# Patient Record
Sex: Female | Born: 1985 | Race: Black or African American | Hispanic: No | Marital: Single | Smoking: Never smoker
Health system: Southern US, Community
[De-identification: ages and names within clinical notes are randomized; demographics above are authoritative.]

## PROBLEM LIST (undated history)

## (undated) DIAGNOSIS — R51 Headache: Secondary | ICD-10-CM

## (undated) DIAGNOSIS — K219 Gastro-esophageal reflux disease without esophagitis: Secondary | ICD-10-CM

## (undated) DIAGNOSIS — J189 Pneumonia, unspecified organism: Secondary | ICD-10-CM

## (undated) DIAGNOSIS — B3731 Acute candidiasis of vulva and vagina: Secondary | ICD-10-CM

## (undated) DIAGNOSIS — A5901 Trichomonal vulvovaginitis: Secondary | ICD-10-CM

## (undated) DIAGNOSIS — G4734 Idiopathic sleep related nonobstructive alveolar hypoventilation: Secondary | ICD-10-CM

## (undated) DIAGNOSIS — B373 Candidiasis of vulva and vagina: Secondary | ICD-10-CM

## (undated) DIAGNOSIS — R030 Elevated blood-pressure reading, without diagnosis of hypertension: Secondary | ICD-10-CM

## (undated) DIAGNOSIS — D649 Anemia, unspecified: Secondary | ICD-10-CM

## (undated) DIAGNOSIS — R519 Headache, unspecified: Secondary | ICD-10-CM

## (undated) DIAGNOSIS — K5909 Other constipation: Secondary | ICD-10-CM

## (undated) DIAGNOSIS — Q011 Nasofrontal encephalocele: Secondary | ICD-10-CM

## (undated) DIAGNOSIS — R002 Palpitations: Secondary | ICD-10-CM

## (undated) DIAGNOSIS — F419 Anxiety disorder, unspecified: Secondary | ICD-10-CM

## (undated) DIAGNOSIS — E669 Obesity, unspecified: Secondary | ICD-10-CM

## (undated) HISTORY — DX: Elevated blood-pressure reading, without diagnosis of hypertension: R03.0

## (undated) HISTORY — DX: Idiopathic sleep related nonobstructive alveolar hypoventilation: G47.34

## (undated) HISTORY — DX: Palpitations: R00.2

---
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
## 2008-02-12 ENCOUNTER — Emergency Department (HOSPITAL_COMMUNITY): Admission: EM | Admit: 2008-02-12 | Discharge: 2008-02-12 | Payer: Self-pay | Admitting: Emergency Medicine

## 2008-10-01 ENCOUNTER — Emergency Department (HOSPITAL_COMMUNITY): Admission: EM | Admit: 2008-10-01 | Discharge: 2008-10-01 | Payer: Self-pay | Admitting: Emergency Medicine

## 2009-03-27 ENCOUNTER — Emergency Department (HOSPITAL_COMMUNITY): Admission: EM | Admit: 2009-03-27 | Discharge: 2009-03-27 | Payer: Self-pay | Admitting: Family Medicine

## 2011-01-08 ENCOUNTER — Emergency Department (HOSPITAL_COMMUNITY)
Admission: EM | Admit: 2011-01-08 | Discharge: 2011-01-09 | Payer: Self-pay | Source: Home / Self Care | Admitting: Emergency Medicine

## 2011-01-11 LAB — POCT I-STAT, CHEM 8
BUN: 9 mg/dL (ref 6–23)
Calcium, Ion: 1.23 mmol/L (ref 1.12–1.32)
Creatinine, Ser: 0.9 mg/dL (ref 0.4–1.2)
Potassium: 4.3 mEq/L (ref 3.5–5.1)

## 2011-01-12 LAB — CBC: MCHC: 27.5 g/dL — ABNORMAL LOW (ref 30.0–36.0)

## 2011-01-12 LAB — DIFFERENTIAL
Lymphs Abs: 2.9 10*3/uL (ref 0.7–4.0)
Monocytes Relative: 6 % (ref 3–12)
Neutrophils Relative %: 71 % (ref 43–77)

## 2011-01-26 ENCOUNTER — Inpatient Hospital Stay (INDEPENDENT_AMBULATORY_CARE_PROVIDER_SITE_OTHER)
Admission: RE | Admit: 2011-01-26 | Discharge: 2011-01-26 | Disposition: A | Discharge status: Home / Self Care | Payer: Self-pay | Source: Ambulatory Visit | Attending: Family Medicine | Admitting: Family Medicine

## 2011-01-26 DIAGNOSIS — R350 Frequency of micturition: Secondary | ICD-10-CM

## 2011-01-26 LAB — POCT URINALYSIS DIPSTICK
Ketones, ur: NEGATIVE mg/dL
Protein, ur: 100 mg/dL — AB
Urine Glucose, Fasting: NEGATIVE mg/dL
Urobilinogen, UA: 0.2 mg/dL (ref 0.0–1.0)
pH: 5.5 (ref 5.0–8.0)

## 2011-01-28 LAB — URINE CULTURE: Culture  Setup Time: 201202072257

## 2011-01-29 ENCOUNTER — Inpatient Hospital Stay (INDEPENDENT_AMBULATORY_CARE_PROVIDER_SITE_OTHER)
Admission: RE | Admit: 2011-01-29 | Discharge: 2011-01-29 | Disposition: A | Discharge status: Home / Self Care | Payer: Self-pay | Source: Ambulatory Visit | Attending: Family Medicine | Admitting: Family Medicine

## 2011-01-29 DIAGNOSIS — K625 Hemorrhage of anus and rectum: Secondary | ICD-10-CM

## 2011-01-29 DIAGNOSIS — K5900 Constipation, unspecified: Secondary | ICD-10-CM

## 2011-04-02 ENCOUNTER — Inpatient Hospital Stay (INDEPENDENT_AMBULATORY_CARE_PROVIDER_SITE_OTHER)
Admission: RE | Admit: 2011-04-02 | Discharge: 2011-04-02 | Disposition: A | Discharge status: Home / Self Care | Payer: Self-pay | Source: Ambulatory Visit | Attending: Emergency Medicine | Admitting: Emergency Medicine

## 2011-04-02 DIAGNOSIS — N898 Other specified noninflammatory disorders of vagina: Secondary | ICD-10-CM

## 2011-04-02 DIAGNOSIS — D649 Anemia, unspecified: Secondary | ICD-10-CM

## 2011-04-02 LAB — POCT I-STAT, CHEM 8
Calcium, Ion: 1.2 mmol/L (ref 1.12–1.32)
HCT: 34 % — ABNORMAL LOW (ref 36.0–46.0)
Hemoglobin: 11.6 g/dL — ABNORMAL LOW (ref 12.0–15.0)
Potassium: 5.3 mEq/L — ABNORMAL HIGH (ref 3.5–5.1)
Sodium: 140 mEq/L (ref 135–145)
TCO2: 28 mmol/L (ref 0–100)

## 2011-04-29 ENCOUNTER — Inpatient Hospital Stay (INDEPENDENT_AMBULATORY_CARE_PROVIDER_SITE_OTHER)
Admission: RE | Admit: 2011-04-29 | Discharge: 2011-04-29 | Disposition: A | Discharge status: Home / Self Care | Payer: Self-pay | Source: Ambulatory Visit

## 2011-04-29 DIAGNOSIS — R109 Unspecified abdominal pain: Secondary | ICD-10-CM

## 2011-04-29 LAB — POCT URINALYSIS DIP (DEVICE)
Bilirubin Urine: NEGATIVE
Ketones, ur: NEGATIVE mg/dL
Protein, ur: NEGATIVE mg/dL
Specific Gravity, Urine: 1.015 (ref 1.005–1.030)

## 2011-04-29 LAB — POCT PREGNANCY, URINE: Preg Test, Ur: NEGATIVE

## 2012-03-29 ENCOUNTER — Emergency Department (HOSPITAL_COMMUNITY)
Admission: EM | Admit: 2012-03-29 | Discharge: 2012-03-29 | Disposition: A | Discharge status: Home / Self Care | Payer: Self-pay | Attending: Emergency Medicine | Admitting: Emergency Medicine

## 2012-03-29 ENCOUNTER — Encounter (HOSPITAL_COMMUNITY): Payer: Self-pay | Admitting: *Deleted

## 2012-03-29 ENCOUNTER — Emergency Department (HOSPITAL_COMMUNITY): Payer: Self-pay

## 2012-03-29 DIAGNOSIS — R05 Cough: Secondary | ICD-10-CM | POA: Insufficient documentation

## 2012-03-29 DIAGNOSIS — R509 Fever, unspecified: Secondary | ICD-10-CM | POA: Insufficient documentation

## 2012-03-29 DIAGNOSIS — IMO0001 Reserved for inherently not codable concepts without codable children: Secondary | ICD-10-CM | POA: Insufficient documentation

## 2012-03-29 DIAGNOSIS — R059 Cough, unspecified: Secondary | ICD-10-CM | POA: Insufficient documentation

## 2012-03-29 MED ORDER — IBUPROFEN 800 MG PO TABS
800.0000 mg | ORAL_TABLET | Freq: Once | ORAL | Status: AC
  Administered 2012-03-29: 800 mg via ORAL
  Filled 2012-03-29: qty 1

## 2012-03-29 MED ORDER — AZITHROMYCIN 250 MG PO TABS: ORAL_TABLET | ORAL | Status: AC

## 2012-03-29 NOTE — Discharge Instructions (Signed)
Your chest x-ray today has not shown any signs for concerning pneumonia infection. Your providers today have getting a prescription for an antibiotic to help prevent any type of infection causing your symptoms. Please take this as instructed for the following time. Continue to get plenty of rest and drink plenty of fluids to stay hydrated. You may use over-the-counter topical medicines to help with your symptoms.   Cough, Adult  A cough is a reflex that helps clear your throat and airways. It can help heal the body or may be a reaction to an irritated airway. A cough may only last 2 or 3 weeks (acute) or may last more than 8 weeks (chronic).  CAUSES Acute cough:  Viral or bacterial infections.  Chronic cough:  Infections.   Allergies.   Asthma.   Post-nasal drip.   Smoking.   Heartburn or acid reflux.   Some medicines.   Chronic lung problems (COPD).   Cancer.  SYMPTOMS   Cough.   Fever.   Chest pain.   Increased breathing rate.   High-pitched whistling sound when breathing (wheezing).   Colored mucus that you cough up (sputum).  TREATMENT   A bacterial cough may be treated with antibiotic medicine.   A viral cough must run its course and will not respond to antibiotics.   Your caregiver may recommend other treatments if you have a chronic cough.  HOME CARE INSTRUCTIONS   Only take over-the-counter or prescription medicines for pain, discomfort, or fever as directed by your caregiver. Use cough suppressants only as directed by your caregiver.   Use a cold steam vaporizer or humidifier in your bedroom or home to help loosen secretions.   Sleep in a semi-upright position if your cough is worse at night.   Rest as needed.   Stop smoking if you smoke.  SEEK IMMEDIATE MEDICAL CARE IF:   You have pus in your sputum.   Your cough starts to worsen.   You cannot control your cough with suppressants and are losing sleep.   You begin coughing up blood.   You  have difficulty breathing.   You develop pain which is getting worse or is uncontrolled with medicine.   You have a fever.  MAKE SURE YOU:   Understand these instructions.   Will watch your condition.   Will get help right away if you are not doing well or get worse.  Document Released: 06/04/2011 Document Revised: 11/25/2011 Document Reviewed: 06/04/2011 [facility] Patient Information 2012 [place], [place].   RESOURCE GUIDE  Dental Problems  Patients with Medicaid: [facility] [phone] [address]                                           [phone] [address] Phone:  [phone]                                                  Phone:  [phone]  If unable to pay or uninsured, contact:  [facility] or [facility]. to become qualified for the adult dental clinic.  Chronic Pain Problems Contact [facility]  [phone] Patients need to be referred by their primary care doctor.  Insufficient Money for Medicine Contact United Way:  call "211" or [facility] 2078425837.  No Primary Care Doctor Call [facility]  [phone] Other agencies that provide inexpensive medical care    [facility]  [phone]    [facility]  [phone]    [facility]  [phone]    [facility]  [phone]    [facility]  [phone]    [facility]  [phone]  Psychological Services [facility] Behavioral Health  [phone] [facility]  [phone] [facility]   [phone] (emergency services [phone])  Substance Abuse Resources [facility]  [phone] [facility] [phone] [facility] [phone] Floydene Flock [phone] Residential & Outpatient Substance Abuse Program  ([phone]  Abuse/Neglect [facility] Child Abuse Hotline (539)486-1145 [facility] Child Abuse Hotline 816-754-5840  (After Hours)  Emergency Shelter [facility] [phone]  [facility] [place] [facility] [phone] [facility] [phone]  MRSA Hotline #:   240-662-1799    [facility] Resources  [facility]     [employer]                          [facility] Dept. [address] [place]                       [address]      [address]  Blondell Reveal Phone:  [phone]                                   Phone:  [phone]                 Phone:  [phone]  [facility] Phone:  [phone]  [facility] Child Abuse Hotline 4091650641 [phone] (After Hours)

## 2012-03-29 NOTE — ED Notes (Signed)
Patient with cold symptoms last few days.  Patient states she has had sore throat, increasing today.

## 2012-03-29 NOTE — ED Notes (Signed)
Pt denies any pain or questions upon discharge. 

## 2012-03-29 NOTE — ED Provider Notes (Signed)
History     CSN: [id number]  Arrival date & time 03/29/12  2038   First MD Initiated Contact with Patient 03/29/12 2142      Chief Complaint  Patient presents with  . Generalized Body Aches     HPI  History provided by the patient. Patient is a 26 year old female with no significant past medical history presents with complaints of nasal congestion, cough and body aches began last night and all day today. 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 Symptoms came on acutely. Patient has used some over-the-counter NyQuil without significant relief. Patient denies any other aggravating or alleviating factors. Symptoms have been associated with some chills and sweats as well as body aches. Patient denies any nausea vomiting diarrhea. Patient denies any fever to me. Patient has no other significant past medical history.    History reviewed. No pertinent past medical history.  History reviewed. No pertinent past surgical history.  History reviewed. No pertinent family history.  History  Substance Use Topics  . Smoking status: Never Smoker   . Smokeless tobacco: Not on file  . Alcohol Use: Yes     socially    OB History    Grav Para Term Preterm Abortions TAB SAB Ect Mult Living                  Review of Systems  Constitutional: Positive for chills, diaphoresis and fatigue. Negative for fever and appetite change.  HENT: Positive for ear pain, congestion, sore throat and rhinorrhea. Negative for sinus pressure.   Respiratory: Positive for cough. Negative for shortness of breath.   Cardiovascular: Negative for chest pain.  Gastrointestinal: Negative for vomiting, abdominal pain and diarrhea.  Genitourinary: Negative for dysuria, frequency, hematuria and flank pain.  Musculoskeletal: Positive for myalgias.  Skin: Negative for rash.    Allergies  Review of patient's allergies indicates no known allergies.  Home Medications   Current Outpatient  Rx  Name Route Sig Dispense Refill  . NYQUIL PO Oral Take 30 mLs by mouth daily as needed. For cough and congestion      BP 129/61  Pulse 117  Temp(Src) 99.1 F (37.3 C) (Oral)  Resp 18  SpO2 99%  LMP 03/22/2012  Physical Exam  Nursing note and vitals reviewed. Constitutional: She is oriented to person, place, and time. She appears well-developed and well-nourished. No distress.  HENT:  Head: Normocephalic.  Right Ear: Tympanic membrane normal.  Left Ear: Tympanic membrane normal.  Mouth/Throat: Oropharynx is clear and moist.       Mild congestion   Neck: Normal range of motion. Neck supple.  Cardiovascular: Normal rate and regular rhythm.   Pulmonary/Chest: Effort normal and breath sounds normal. No respiratory distress. She has no wheezes. She has no rales.  Abdominal: Soft.  Lymphadenopathy:    She has no cervical adenopathy.  Neurological: She is alert and oriented to person, place, and time.  Skin: Skin is warm and dry. No rash noted.  Psychiatric: She has a normal mood and affect. Her behavior is normal.    ED Course  Procedures      Dg Chest 2 View  03/29/2012  *RADIOLOGY REPORT*  Clinical Data: Dizziness and body aches.  CHEST - 2 VIEW  Comparison: Two-view chest 10/01/2008.  Findings: The heart size is normal.  The lungs are clear.  The visualized soft tissues and bony thorax are unremarkable.  IMPRESSION: Negative chest.  Original Report Authenticated By: Jamesetta Orleans. MATTERN, M.D.  1. Cough   2. Fever       MDM  8:30 PM patient seen and evaluated. Patient no acute distress. Patient appears comfortable resting well.        Angus Seller, [place] 03/30/12 [phone]

## 2012-03-29 NOTE — ED Notes (Signed)
Pt reports generalized body aches x 24 hours.  Denies pain.  Pt reports productive cough that is greenish.  Reports fever and chills at home also.

## 2012-03-31 NOTE — ED Provider Notes (Signed)
Medical screening examination/treatment/procedure(s) were performed by non-physician practitioner and as supervising physician I was immediately available for consultation/collaboration. Devoria Albe, MD, Armando Gang   Ward Givens, MD 03/31/12 [phone]

## 2012-05-25 ENCOUNTER — Emergency Department (INDEPENDENT_AMBULATORY_CARE_PROVIDER_SITE_OTHER)
Admission: EM | Admit: 2012-05-25 | Discharge: 2012-05-25 | Disposition: A | Discharge status: Home / Self Care | Payer: Self-pay | Source: Home / Self Care | Attending: Emergency Medicine | Admitting: Emergency Medicine

## 2012-05-25 ENCOUNTER — Encounter (HOSPITAL_COMMUNITY): Payer: Self-pay | Admitting: Emergency Medicine

## 2012-05-25 DIAGNOSIS — N760 Acute vaginitis: Secondary | ICD-10-CM

## 2012-05-25 HISTORY — DX: Candidiasis of vulva and vagina: B37.3

## 2012-05-25 HISTORY — DX: Trichomonal vulvovaginitis: A59.01

## 2012-05-25 HISTORY — DX: Acute candidiasis of vulva and vagina: B37.31

## 2012-05-25 LAB — WET PREP, GENITAL: Trich, Wet Prep: NONE SEEN

## 2012-05-25 LAB — POCT URINALYSIS DIP (DEVICE)
Bilirubin Urine: NEGATIVE
Nitrite: POSITIVE — AB
Protein, ur: NEGATIVE mg/dL
Urobilinogen, UA: 0.2 mg/dL (ref 0.0–1.0)

## 2012-05-25 LAB — POCT PREGNANCY, URINE: Preg Test, Ur: NEGATIVE

## 2012-05-25 MED ORDER — FLUCONAZOLE 150 MG PO TABS: ORAL_TABLET | ORAL | Status: AC

## 2012-05-25 NOTE — Discharge Instructions (Signed)
Take the medication as written. Give us a working phone number so that we can contact you if needed. Refrain from sexual contact until you know your results and your partner(s) are treated. Return if you get worse, have a fever >100.4, or for any concerns.  ° °Go to www.goodrx.com to look up your medications. This will give you a list of where you can find your prescriptions at the most affordable prices.  °

## 2012-05-25 NOTE — ED Provider Notes (Signed)
History     CSN: [id number]  Arrival date & time 05/25/12  1754   First MD Initiated Contact with Patient 05/25/12 1905      Chief Complaint  Patient presents with  . Vaginal Itching  . Rash    (Consider location/radiation/quality/duration/timing/severity/associated sxs/prior treatment) HPI Comments: Patient reports.vaginal itching, vulvar irritation and erythema starting a week ago. Reports some dysuria. No nausea, vomiting, urgency, frequency, cloudy or oderous urine, hematuria. No vaginal odor or discharge. No genital rash. No fevers, abdominal pain, back pain. She was treated for trichomonas last week with 2 g of Flagyl. She states she's not been sexually active since. She is sexually active with single female partner. He is asymptomatic. She states he was treated for Trichomonas as well. They use condoms intermittently. States this feels similar to previous episodes of yeast infections. Start using some Monistat external cream today with some relief. Has a history trichomonas, vaginal yeast infections. No history of diabetes, gonorrhea, BV, Chlamydia, herpes, HIV, syphilis.  ROS as noted in HPI. All other ROS negative.   Patient is a 26 y.o. female presenting with vaginal itching. The history is provided by the patient. No language interpreter was used.  Vaginal Itching This is a new problem. The current episode started more than 1 week ago. The problem occurs constantly. The problem has not changed since onset.Pertinent negatives include no abdominal pain. The symptoms are aggravated by nothing. The symptoms are relieved by nothing. Treatments tried: Monistat. The treatment provided mild relief.    Past Medical History  Diagnosis Date  . Trichomonal vaginitis   . Vaginal yeast infection     History reviewed. No pertinent past surgical history.  History reviewed. No pertinent family history.  History  Substance Use Topics  . Smoking status: Never Smoker   . Smokeless tobacco:  Not on file  . Alcohol Use: Yes     socially    OB History    Grav Para Term Preterm Abortions TAB SAB Ect Mult Living                  Review of Systems  Gastrointestinal: Negative for abdominal pain.    Allergies  Review of patient's allergies indicates no known allergies.  Home Medications   Current Outpatient Rx  Name Route Sig Dispense Refill  . MICONAZOLE NITRATE 2 % EX CREA Topical Apply topically 2 (two) times daily.    Marland Kitchen FLUCONAZOLE 150 MG PO TABS  1 tab po x 1. May repeat in 72 hours if no improvement 2 tablet 0    BP 117/73  Pulse 110  Temp(Src) 99.2 F (37.3 C) (Oral)  Resp 18  SpO2 100%  LMP 05/03/2012  Physical Exam  Nursing note and vitals reviewed. Constitutional: She is oriented to person, place, and time. She appears well-developed and well-nourished. No distress.  HENT:  Head: Normocephalic and atraumatic.  Eyes: EOM are normal.  Neck: Normal range of motion.  Cardiovascular: Normal rate.   Pulmonary/Chest: Effort normal.  Abdominal: Soft. Bowel sounds are normal. She exhibits no distension. There is no tenderness. There is no rebound and no guarding.  Genitourinary: Uterus normal. Pelvic exam was performed with patient supine. There is no rash on the right labia. There is no rash on the left labia. Uterus is not tender. Cervix exhibits no motion tenderness and no friability. Right adnexum displays no mass, no tenderness and no fullness. Left adnexum displays no mass, no tenderness and no fullness. No erythema, tenderness or bleeding  around the vagina. No foreign body around the vagina. Vaginal discharge found.       Erythematous, irritated vulva. No blisters noted. Scant Thick white nonodorous vaginal d/c. Questionable lesion on cervix. Chaperone present during exam.  Musculoskeletal: Normal range of motion.  Lymphadenopathy:       Right: No inguinal adenopathy present.       Left: No inguinal adenopathy present.  Neurological: She is alert and  oriented to person, place, and time.  Skin: Skin is warm and dry.  Psychiatric: She has a normal mood and affect. Her behavior is normal. Judgment and thought content normal.    ED Course  Procedures (including critical care time)  Labs Reviewed  POCT URINALYSIS DIP (DEVICE) - Abnormal; Notable for the following:    Hgb urine dipstick TRACE (*)    Nitrite POSITIVE (*)    Leukocytes, UA SMALL (*) Biochemical Testing Only. Please order routine urinalysis from main lab if confirmatory testing is needed.   All other components within normal limits  WET PREP, GENITAL - Abnormal; Notable for the following:    Yeast Wet Prep HPF POC FEW (*)    WBC, Wet Prep HPF POC MODERATE (*)    All other components within normal limits  POCT PREGNANCY, URINE  GC/CHLAMYDIA PROBE AMP, GENITAL  HERPES SIMPLEX VIRUS CULTURE   No results found.   1. Vaginitis and vulvovaginitis     Results for orders placed during the hospital encounter of 05/25/12  POCT URINALYSIS DIP (DEVICE)      Component Value Range   Glucose, UA NEGATIVE  NEGATIVE (mg/dL)   Bilirubin Urine NEGATIVE  NEGATIVE    Ketones, ur NEGATIVE  NEGATIVE (mg/dL)   Specific Gravity, Urine 1.010  1.005 - 1.030    Hgb urine dipstick TRACE (*) NEGATIVE    pH 6.5  5.0 - 8.0    Protein, ur NEGATIVE  NEGATIVE (mg/dL)   Urobilinogen, UA 0.2  0.0 - 1.0 (mg/dL)   Nitrite POSITIVE (*) NEGATIVE    Leukocytes, UA SMALL (*) NEGATIVE   POCT PREGNANCY, URINE      Component Value Range   Preg Test, Ur NEGATIVE  NEGATIVE   WET PREP, GENITAL      Component Value Range   Yeast Wet Prep HPF POC FEW (*) NONE SEEN    Trich, Wet Prep NONE SEEN  NONE SEEN    Clue Cells Wet Prep HPF POC NONE SEEN  NONE SEEN    WBC, Wet Prep HPF POC MODERATE (*) NONE SEEN    Results for orders placed during the hospital encounter of 05/25/12  POCT URINALYSIS DIP (DEVICE)      Component Value Range   Glucose, UA NEGATIVE  NEGATIVE (mg/dL)   Bilirubin Urine NEGATIVE   NEGATIVE    Ketones, ur NEGATIVE  NEGATIVE (mg/dL)   Specific Gravity, Urine 1.010  1.005 - 1.030    Hgb urine dipstick TRACE (*) NEGATIVE    pH 6.5  5.0 - 8.0    Protein, ur NEGATIVE  NEGATIVE (mg/dL)   Urobilinogen, UA 0.2  0.0 - 1.0 (mg/dL)   Nitrite POSITIVE (*) NEGATIVE    Leukocytes, UA SMALL (*) NEGATIVE   POCT PREGNANCY, URINE      Component Value Range   Preg Test, Ur NEGATIVE  NEGATIVE   WET PREP, GENITAL      Component Value Range   Yeast Wet Prep HPF POC FEW (*) NONE SEEN    Trich, Wet Prep NONE SEEN  NONE  SEEN    Clue Cells Wet Prep HPF POC NONE SEEN  NONE SEEN    WBC, Wet Prep HPF POC MODERATE (*) NONE SEEN      MDM  H&P most c/w yeast infection, especially as she was treated for trichomonas last week. She states that she was called by the health department today and told her gonorrhea and Chlamydia were negative. Will not repeat the gonorrhea and Chlamydia probe. Will sent off wet prep, and HSV culture . Udip noted,  this is most likely from the vaginal infection.  Will not treat empirically now for gonorrhea Chlamydia. Will send home with  diflucan for yeast infection. Advised pt to refrain from sexual contact until she  knows lab results, symptoms resolve, and partner(s) are treated. Pt provided working phone number. Pt agrees.     Luiz Blare, MD 05/25/12 [phone]

## 2012-05-25 NOTE — ED Notes (Signed)
PT HERE WITH C/O VAG RASH TO OUTER RIGHT LABIA AND ITCH/BURN WITH URINATION THAT STARTED X 10 DYS AGO.STATES SHE TESTED NEG FOR STD'S [facility] LAST WEEK.NO VAG D/C OR PAIN

## 2012-05-26 NOTE — ED Notes (Signed)
Wet prep: few yeast, mod. WBC's. Pt. Adequately treated with Diflucan. Vassie Moselle 05/26/2012

## 2012-05-29 LAB — HERPES SIMPLEX VIRUS CULTURE: Culture: NOT DETECTED

## 2013-03-24 ENCOUNTER — Encounter (HOSPITAL_COMMUNITY): Payer: Self-pay | Admitting: Emergency Medicine

## 2013-03-24 ENCOUNTER — Other Ambulatory Visit (HOSPITAL_COMMUNITY)
Admission: RE | Admit: 2013-03-24 | Discharge: 2013-03-24 | Disposition: A | Discharge status: Home / Self Care | Payer: Self-pay | Source: Ambulatory Visit | Attending: Emergency Medicine | Admitting: Emergency Medicine

## 2013-03-24 ENCOUNTER — Emergency Department (INDEPENDENT_AMBULATORY_CARE_PROVIDER_SITE_OTHER)
Admission: EM | Admit: 2013-03-24 | Discharge: 2013-03-24 | Disposition: A | Discharge status: Home / Self Care | Payer: Self-pay | Source: Home / Self Care | Attending: Emergency Medicine | Admitting: Emergency Medicine

## 2013-03-24 DIAGNOSIS — Z113 Encounter for screening for infections with a predominantly sexual mode of transmission: Secondary | ICD-10-CM | POA: Insufficient documentation

## 2013-03-24 DIAGNOSIS — N760 Acute vaginitis: Secondary | ICD-10-CM | POA: Insufficient documentation

## 2013-03-24 DIAGNOSIS — B373 Candidiasis of vulva and vagina: Secondary | ICD-10-CM

## 2013-03-24 DIAGNOSIS — N390 Urinary tract infection, site not specified: Secondary | ICD-10-CM

## 2013-03-24 LAB — POCT URINALYSIS DIP (DEVICE)
Ketones, ur: NEGATIVE mg/dL
Nitrite: NEGATIVE
Protein, ur: NEGATIVE mg/dL
Urobilinogen, UA: 1 mg/dL (ref 0.0–1.0)
pH: 7.5 (ref 5.0–8.0)

## 2013-03-24 MED ORDER — METRONIDAZOLE 500 MG PO TABS: 500.0000 mg | ORAL_TABLET | Freq: Two times a day (BID) | ORAL | Status: DC

## 2013-03-24 MED ORDER — METRONIDAZOLE 500 MG PO TABS: ORAL_TABLET | ORAL | Status: DC

## 2013-03-24 MED ORDER — FLUCONAZOLE 200 MG PO TABS: 200.0000 mg | ORAL_TABLET | Freq: Every day | ORAL | Status: AC

## 2013-03-24 MED ORDER — SULFAMETHOXAZOLE-TRIMETHOPRIM 800-160 MG PO TABS: 1.0000 | ORAL_TABLET | Freq: Two times a day (BID) | ORAL | Status: DC

## 2013-03-24 NOTE — ED Provider Notes (Signed)
Medical screening examination/treatment/procedure(s) were performed by non-physician practitioner and as supervising physician I was immediately available for consultation/collaboration.  Leslee Home, M.D.  Reuben Likes, MD 03/24/13 [phone]

## 2013-03-24 NOTE — ED Provider Notes (Signed)
History     CSN: [id number]  Arrival date & time 03/24/13  1109   First MD Initiated Contact with Patient 03/24/13 1129      Chief Complaint  Patient presents with  . Vaginal Itching     Patient is a 27 y.o. female presenting with vaginal itching. The history is provided by the patient.  Vaginal Itching This is a new problem. The current episode started more than 1 week ago. The problem occurs constantly. The problem has been gradually worsening. Pertinent negatives include no abdominal pain. She has tried nothing for the symptoms.  Pt reports a 2 week h/o persistent vag itching. Denies abd pain or noticing any unusual vag d/c. States she is sexually active but always uses barrier protection. Admits to h/o BV and Trich but no other STD hx. States the vaginal irritation now involves the external vaginal area as well as internal. Has noticed some frequency of urination but denies dysuria.   Past Medical History  Diagnosis Date  . Trichomonal vaginitis   . Vaginal yeast infection     History reviewed. No pertinent past surgical history.  No family history on file.  History  Substance Use Topics  . Smoking status: Never Smoker   . Smokeless tobacco: Not on file  . Alcohol Use: Yes     Comment: socially    OB History   Grav Para Term Preterm Abortions TAB SAB Ect Mult Living                  Review of Systems  Constitutional: Negative.   HENT: Negative.   Eyes: Negative.   Respiratory: Negative.   Cardiovascular: Negative.   Gastrointestinal: Negative.  Negative for abdominal pain.  Endocrine: Negative.   Genitourinary: Positive for frequency. Negative for dysuria, urgency, flank pain, vaginal bleeding, vaginal discharge, difficulty urinating, genital sores, menstrual problem and pelvic pain.  Allergic/Immunologic: Negative.   Neurological: Negative.   Hematological: Negative.   Psychiatric/Behavioral: Negative.     Allergies  Review of patient's allergies  indicates no known allergies.  Home Medications   Current Outpatient Rx  Name  Route  Sig  Dispense  Refill  . miconazole (MICOTIN) 2 % cream   Topical   Apply topically 2 (two) times daily.           BP 127/83  Pulse 102  Temp(Src) 98.8 F (37.1 C) (Oral)  Resp 18  SpO2 100%  LMP 01/26/2013  Physical Exam  Constitutional: She is oriented to person, place, and time. She appears well-developed and well-nourished.  HENT:  Head: Normocephalic and atraumatic.  Eyes: Conjunctivae are normal.  Cardiovascular: Normal rate.   Pulmonary/Chest: Effort normal.  Abdominal: Hernia confirmed negative in the right inguinal area and confirmed negative in the left inguinal area.  Genitourinary: There is no rash, tenderness, lesion or injury on the right labia. There is no rash, tenderness, lesion or injury on the left labia. Uterus is not deviated, not enlarged, not fixed and not tender. Cervix exhibits no motion tenderness, no discharge and no friability. Right adnexum displays no mass, no tenderness and no fullness. Left adnexum displays no mass, no tenderness and no fullness. There is erythema around the vagina. No tenderness or bleeding around the vagina. No foreign body around the vagina. Vaginal discharge found.  Thick white vag d/c  Musculoskeletal: Normal range of motion.  Lymphadenopathy:       Right: No inguinal adenopathy present.       Left: No inguinal adenopathy  present.  Neurological: She is alert and oriented to person, place, and time.  Skin: Skin is warm and dry.  Psychiatric: She has a normal mood and affect.    ED Course  Pelvic exam Date/Time: 03/24/2013 12:17 PM Performed by: Leanne Chang Authorized by: Leslee Home C Consent: Verbal consent obtained. Risks and benefits: risks, benefits and alternatives were discussed Consent given by: patient Patient understanding: patient states understanding of the procedure being performed Required items: required  blood products, implants, devices, and special equipment available Patient identity confirmed: verbally with patient and arm band Local anesthesia used: no Patient sedated: no Patient tolerance: Patient tolerated the procedure well with no immediate complications.   (including critical care time)  Labs Reviewed - No data to display No results found.   No diagnosis found.    MDM  2 week h/o vag itching. Denies any unprotected intercourse. C/O some urinary frequency as well. U/A suggestive of UTI. Will treat w/ Septra x 3 days since having sx's. Will treat for yeast based on exam. Pt will be d/c'd w/ Rx's for Flagyl (One regimen for Trich and one for BV).  Pt will be notified which to get filled. If GC/Chlamydia positive will have pt return here for tx. Pt agreeable.         Leanne Chang, NP 03/24/13 1345  Roma Kayser Khelani Kops, NP 03/24/13 1345

## 2013-03-24 NOTE — ED Notes (Signed)
Pt c/o vag irritation onset 2 weeks w/a slight odor to it Denies: vag discharge, hematuria, urinary prob, abd/back pain  She is alert and oriented w/no signs of acute distress.

## 2013-03-28 NOTE — ED Notes (Signed)
GC/Chlamydia neg., Affirm: Candida pos., Gardnerella and Trich neg.  Pt. adequately treated with Diflucan. Vassie Moselle 03/28/2013

## 2013-04-26 ENCOUNTER — Other Ambulatory Visit: Payer: Self-pay

## 2013-12-03 ENCOUNTER — Encounter (HOSPITAL_COMMUNITY): Payer: Self-pay | Admitting: Emergency Medicine

## 2013-12-03 ENCOUNTER — Emergency Department (HOSPITAL_COMMUNITY)
Admission: EM | Admit: 2013-12-03 | Discharge: 2013-12-03 | Disposition: A | Discharge status: Home / Self Care | Payer: Self-pay | Attending: Emergency Medicine | Admitting: Emergency Medicine

## 2013-12-03 DIAGNOSIS — R509 Fever, unspecified: Secondary | ICD-10-CM

## 2013-12-03 DIAGNOSIS — J029 Acute pharyngitis, unspecified: Secondary | ICD-10-CM

## 2013-12-03 DIAGNOSIS — Z2089 Contact with and (suspected) exposure to other communicable diseases: Secondary | ICD-10-CM | POA: Insufficient documentation

## 2013-12-03 DIAGNOSIS — Z8619 Personal history of other infectious and parasitic diseases: Secondary | ICD-10-CM | POA: Insufficient documentation

## 2013-12-03 DIAGNOSIS — IMO0001 Reserved for inherently not codable concepts without codable children: Secondary | ICD-10-CM | POA: Insufficient documentation

## 2013-12-03 DIAGNOSIS — R000 Tachycardia, unspecified: Secondary | ICD-10-CM | POA: Insufficient documentation

## 2013-12-03 LAB — POCT I-STAT, CHEM 8
BUN: 6 mg/dL (ref 6–23)
Sodium: 141 mEq/L (ref 135–145)
TCO2: 25 mmol/L (ref 0–100)

## 2013-12-03 LAB — MONONUCLEOSIS SCREEN: Mono Screen: NEGATIVE

## 2013-12-03 MED ORDER — SODIUM CHLORIDE 0.9 % IV BOLUS (SEPSIS): 500.0000 mL | Freq: Once | INTRAVENOUS | Status: DC

## 2013-12-03 MED ORDER — KETOROLAC TROMETHAMINE 30 MG/ML IJ SOLN
30.0000 mg | Freq: Once | INTRAMUSCULAR | Status: AC
  Administered 2013-12-03: 30 mg via INTRAVENOUS
  Filled 2013-12-03: qty 1

## 2013-12-03 MED ORDER — ACETAMINOPHEN 500 MG PO TABS
1000.0000 mg | ORAL_TABLET | Freq: Once | ORAL | Status: AC
  Administered 2013-12-03: 1000 mg via ORAL
  Filled 2013-12-03: qty 2

## 2013-12-03 MED ORDER — SODIUM CHLORIDE 0.9 % IV BOLUS (SEPSIS)
1000.0000 mL | Freq: Once | INTRAVENOUS | Status: AC
  Administered 2013-12-03: 1000 mL via INTRAVENOUS

## 2013-12-03 MED ORDER — IBUPROFEN 800 MG PO TABS: 800.0000 mg | ORAL_TABLET | Freq: Three times a day (TID) | ORAL | Status: DC

## 2013-12-03 NOTE — ED Notes (Signed)
Pt started 2 days ago with sore throat that has gotten progressively worse. Reports chills and body aches. HR is 127.

## 2013-12-03 NOTE — ED Provider Notes (Signed)
TIME SEEN: 10:07 AM  CHIEF COMPLAINT: Sore throat, chills  HPI: Patient is a 27 y.o. female with no significant past medical history who presents the emergency department with 2-3 days of sore throat it is progressively worse, body aches and chills. She has not taken her temperature at home. She has not had any cough, chest pain or shortness of breath, nausea or vomiting, diarrhea, rash. No sick contacts. No recent international travel. Patient has a heart rate fluctuates between the 120s and 140s. She denies any palpitations, dizziness. She states she is feeling weak.  ROS: See HPI Constitutional: no fever  Eyes: no drainage  ENT: no runny nose   Cardiovascular:  no chest pain  Resp: no SOB  GI: no vomiting GU: no dysuria Integumentary: no rash  Allergy: no hives  Musculoskeletal: no leg swelling  Neurological: no slurred speech ROS otherwise negative  PAST MEDICAL HISTORY/PAST SURGICAL HISTORY:  Past Medical History  Diagnosis Date  . Trichomonal vaginitis   . Vaginal yeast infection     MEDICATIONS:  Prior to Admission medications   Medication Sig Start Date End Date Taking? Authorizing Provider  ibuprofen (ADVIL,MOTRIN) 200 MG tablet Take 200 mg by mouth every 6 (six) hours as needed for fever, headache or mild pain.   Yes Historical Provider, MD    ALLERGIES:  No Known Allergies  SOCIAL HISTORY:  History  Substance Use Topics  . Smoking status: Never Smoker   . Smokeless tobacco: Not on file  . Alcohol Use: Yes     Comment: socially    FAMILY HISTORY: No family history on file.  EXAM: BP 176/87  Pulse 126  Temp(Src) 100.4 F (38 C) (Oral)  Resp 14  Ht 5\' 1"  (1.549 m)  Wt 280 lb (127.007 kg)  BMI 52.93 kg/m2  SpO2 98%  LMP 11/16/2013 CONSTITUTIONAL: Alert and oriented and responds appropriately to questions. Well-appearing; well-nourished HEAD: Normocephalic EYES: Conjunctivae clear, PERRL ENT: normal nose; no rhinorrhea; moist mucous membranes;  patient has bilateral tonsillar hypertrophy without exudate, pharyngeal erythema, no trismus or drooling, no uvular deviation, no muffled voice, TMs are clear bilaterally NECK: Supple, no meningismus, no LAD  CARD: Tachycardic; S1 and S2 appreciated; no murmurs, no clicks, no rubs, no gallops RESP: Normal chest excursion without splinting or tachypnea; breath sounds clear and equal bilaterally; no wheezes, no rhonchi, no rales,  ABD/GI: Normal bowel sounds; non-distended; soft, non-tender, no rebound, no guarding BACK:  The back appears normal and is non-tender to palpation, there is no CVA tenderness EXT: Normal ROM in all joints; non-tender to palpation; no edema; normal capillary refill; no cyanosis    SKIN: Normal color for age and race; warm NEURO: Moves all extremities equally PSYCH: The patient's mood and manner are appropriate. Grooming and personal hygiene are appropriate.  MEDICAL DECISION MAKING: Patient here with pharyngitis, chills and myalgias. She is nontoxic appearing. She is febrile and anxious. She is tachycardic. Will check basic labs, give IV fluids and Toradol. Suspect her tachycardia secondary to her fever and anxiety. Strep test is negative.  ED PROGRESS: Patient reports feeling better after Toradol. Her heart rate is now in the 110s.  Her labs are reassuring. Strep test is negative. Monospot negative. Urine pregnancy test pending. She is currently receiving IV fluids.  Patient's heart rate is now in the low 100s. Urine pregnancy test pending.  Urine pregnancy test is negative. Patient's heart rate has improved and she is feeling better. We'll discharge home. Suspect this is viral illness.  Given supportive care instructions, prescription for ibuprofen. Patient verbalizes understanding is comfortable with plan.    EKG Interpretation    Date/Time:  Monday December 03 2013 10:32:27 EST Ventricular Rate:  115 PR Interval:  136 QRS Duration: 82 QT Interval:  314 QTC  Calculation: 434 R Axis:   27 Text Interpretation:  Sinus tachycardia Ventricular premature complex Low voltage, precordial leads Baseline wander in lead(s) V1 Confirmed by Maleea Camilo  DO, Sophea Rackham ([id number]) on 12/03/2013 10:40:40 AM             Layla Maw Julaine Zimny, DO 12/03/13 1357

## 2013-12-03 NOTE — ED Notes (Signed)
Pt's heartrate is 127. Moved to acute side per order of PA.

## 2013-12-04 LAB — CULTURE, GROUP A STREP

## 2013-12-19 ENCOUNTER — Emergency Department (HOSPITAL_COMMUNITY)
Admission: EM | Admit: 2013-12-19 | Discharge: 2013-12-19 | Disposition: A | Discharge status: Home / Self Care | Payer: Self-pay | Attending: Emergency Medicine | Admitting: Emergency Medicine

## 2013-12-19 ENCOUNTER — Encounter (HOSPITAL_COMMUNITY): Payer: Self-pay | Admitting: Emergency Medicine

## 2013-12-19 ENCOUNTER — Emergency Department (HOSPITAL_COMMUNITY): Payer: Self-pay

## 2013-12-19 DIAGNOSIS — J159 Unspecified bacterial pneumonia: Secondary | ICD-10-CM | POA: Insufficient documentation

## 2013-12-19 DIAGNOSIS — Z8619 Personal history of other infectious and parasitic diseases: Secondary | ICD-10-CM | POA: Insufficient documentation

## 2013-12-19 DIAGNOSIS — E669 Obesity, unspecified: Secondary | ICD-10-CM | POA: Insufficient documentation

## 2013-12-19 DIAGNOSIS — R079 Chest pain, unspecified: Secondary | ICD-10-CM | POA: Insufficient documentation

## 2013-12-19 DIAGNOSIS — J189 Pneumonia, unspecified organism: Secondary | ICD-10-CM

## 2013-12-19 HISTORY — DX: Obesity, unspecified: E66.9

## 2013-12-19 MED ORDER — LEVOFLOXACIN 500 MG PO TABS: 500.0000 mg | ORAL_TABLET | Freq: Every day | ORAL | Status: DC

## 2013-12-19 MED ORDER — HYDROCODONE-HOMATROPINE 5-1.5 MG/5ML PO SYRP: 5.0000 mL | ORAL_SOLUTION | Freq: Four times a day (QID) | ORAL | Status: DC | PRN

## 2013-12-19 NOTE — ED Provider Notes (Signed)
Medical screening examination/treatment/procedure(s) were performed by non-physician practitioner and as supervising physician I was immediately available for consultation/collaboration.     Lowen Barringer, MD 12/19/13 1238 

## 2013-12-19 NOTE — ED Notes (Signed)
Pt. reports persistent dry cough with chest congestion for several days , denies fever or chills .  

## 2013-12-19 NOTE — ED Provider Notes (Signed)
CSN: [id number]     Arrival date & time 12/19/13  8657 History   First MD Initiated Contact with Patient 12/19/13 [phone]     Chief Complaint  Patient presents with  . Cough   (Consider location/radiation/quality/duration/timing/severity/associated sxs/prior Treatment) HPI Comments: Patient is a 27 year old female who presents with a 1 week history of chest congestion, dry cough, and chest pain for the past week. Symptoms started gradually and progressively worsened since the onset. Patient has tried multiple OTC remedies without relief. She denies fever or other symptoms. She does report associated sick contacts of people with URI. Her chest pain is located centrally and is aching and associated with coughing. No alleviating factors.   Patient is a 27 y.o. female presenting with cough.  Cough Associated symptoms: chest pain   Associated symptoms: no chills, no fever and no shortness of breath     Past Medical History  Diagnosis Date  . Trichomonal vaginitis   . Vaginal yeast infection   . Obesity    History reviewed. No pertinent past surgical history. No family history on file. History  Substance Use Topics  . Smoking status: Never Smoker   . Smokeless tobacco: Not on file  . Alcohol Use: Yes     Comment: socially   OB History   Grav Para Term Preterm Abortions TAB SAB Ect Mult Living                 Review of Systems  Constitutional: Negative for fever, chills and fatigue.  HENT: Positive for congestion. Negative for trouble swallowing.   Eyes: Negative for visual disturbance.  Respiratory: Positive for cough. Negative for shortness of breath.   Cardiovascular: Positive for chest pain. Negative for palpitations.  Gastrointestinal: Negative for nausea, vomiting, abdominal pain and diarrhea.  Genitourinary: Negative for dysuria and difficulty urinating.  Musculoskeletal: Negative for arthralgias and neck pain.  Skin: Negative for color change.  Neurological: Negative for  dizziness and weakness.  Psychiatric/Behavioral: Negative for dysphoric mood.    Allergies  Review of patient's allergies indicates no known allergies.  Home Medications   Current Outpatient Rx  Name  Route  Sig  Dispense  Refill  . dextromethorphan (DELSYM) 30 MG/5ML liquid   Oral   Take 60 mg by mouth as needed for cough.         Marland Kitchen guaiFENesin (MUCINEX) 600 MG 12 hr tablet   Oral   Take 600 mg by mouth 2 (two) times daily as needed.         Marland Kitchen Phenyleph-CPM-DM-APAP (ALKA-SELTZER PLUS COLD & FLU PO)   Oral   Take 1 tablet by mouth every 6 (six) hours as needed.         Marland Kitchen Phenylephrine-DM-GG-APAP (MUCINEX FAST-MAX COLD FLU PO)   Oral   Take 20 mLs by mouth 2 (two) times daily as needed (cough and cold symptoms).         . Pseudoeph-Doxylamine-DM-APAP (NYQUIL PO)   Oral   Take 1-2 capsules by mouth every 6 (six) hours as needed (cough and cold symptoms).          BP 128/63  Pulse 106  Temp(Src) 98.6 F (37 C) (Oral)  Resp 14  Ht 5\' 4"  (1.626 m)  Wt 299 lb (135.626 kg)  BMI 51.30 kg/m2  SpO2 99%  LMP 12/11/2013 Physical Exam  Nursing note and vitals reviewed. Constitutional: She is oriented to person, place, and time. She appears well-developed and well-nourished. No distress.  HENT:  Head: Normocephalic and atraumatic.  Eyes: Conjunctivae and EOM are normal.  Neck: Normal range of motion.  Cardiovascular: Normal rate and regular rhythm.  Exam reveals no gallop and no friction rub.   No murmur heard. Pulmonary/Chest: Effort normal. She has wheezes. She has no rales. She exhibits no tenderness.  RLL rhonchi and crackles. Other lung fields clear.   Abdominal: Soft. She exhibits no distension. There is no tenderness. There is no rebound and no guarding.  Musculoskeletal: Normal range of motion.  Neurological: She is alert and oriented to person, place, and time. Coordination normal.  Speech is goal-oriented. Moves limbs without ataxia.   Skin: Skin is warm  and dry.  Psychiatric: She has a normal mood and affect. Her behavior is normal.    ED Course  Procedures (including critical care time) Labs Review Labs Reviewed - No data to display Imaging Review Dg Chest 2 View  12/19/2013   CLINICAL DATA:  Cough chest pain and shortness of breath.  EXAM: CHEST  2 VIEW  COMPARISON:  Two-view chest 03/29/2012.  FINDINGS: Ill-defined right lower lobe airspace disease is evident. The left lung is clear. The heart size is exaggerated by low lung volumes. The visualized soft tissues and bony thorax are unremarkable.  IMPRESSION: 1. Ill-defined right lower lobe airspace disease is concerning for early pneumonia. 2. Low lung volumes.   Electronically Signed   By: Gennette Pac M.D.   On: 12/19/2013 08:26    EKG Interpretation   None       MDM   1. CAP (community acquired pneumonia)     8:49 AM Patient's chest xray shows possible early pneumonia in RLL which correlates with clinical exam of crackles and rhonchi in the area. Patient slightly tachycardic with remaining vitals stable. Patient will be discharged with Levaquin and hycodan. Patient advised to return with worsening or concerning symptoms.    Emilia Beck, PA-C 12/19/13 1221

## 2013-12-26 ENCOUNTER — Ambulatory Visit: Payer: Self-pay

## 2014-02-28 ENCOUNTER — Encounter (HOSPITAL_COMMUNITY): Payer: Self-pay | Admitting: Emergency Medicine

## 2014-02-28 ENCOUNTER — Emergency Department (INDEPENDENT_AMBULATORY_CARE_PROVIDER_SITE_OTHER)
Admission: EM | Admit: 2014-02-28 | Discharge: 2014-02-28 | Disposition: A | Discharge status: Home / Self Care | Payer: Self-pay | Source: Home / Self Care | Attending: Family Medicine | Admitting: Family Medicine

## 2014-02-28 DIAGNOSIS — H1010 Acute atopic conjunctivitis, unspecified eye: Secondary | ICD-10-CM

## 2014-02-28 DIAGNOSIS — H1045 Other chronic allergic conjunctivitis: Secondary | ICD-10-CM

## 2014-02-28 NOTE — Discharge Instructions (Signed)
Thank you for coming in today. I think you have allergic conjunctivitis. Use Zaditor eyedrops (ketotifen) twice daily. Use Systane artificial tears. Come back as needed  Allergic Conjunctivitis The conjunctiva is a thin membrane that covers the visible white part of the eyeball and the underside of the eyelids. This membrane protects and lubricates the eye. The membrane has small blood vessels running through it that can normally be seen. When the conjunctiva becomes inflamed, the condition is called conjunctivitis. In response to the inflammation, the conjunctival blood vessels become swollen. The swelling results in redness in the normally white part of the eye. The blood vessels of this membrane also react when a person has allergies and is then called allergic conjunctivitis. This condition usually lasts for as long as the allergy persists. Allergic conjunctivitis cannot be passed to another person (non-contagious). The likelihood of bacterial infection is great and the cause is not likely due to allergies if the inflamed eye has:  A sticky discharge.  Discharge or sticking together of the lids in the morning.  Scaling or flaking of the eyelids where the eyelashes come out.  Red swollen eyelids. CAUSES   Viruses.  Irritants such as foreign bodies.  Chemicals.  General allergic reactions.  Inflammation or serious diseases in the inside or the outside of the eye or the orbit (the boney cavity in which the eye sits) can cause a "red eye." SYMPTOMS   Eye redness.  Tearing.  Itchy eyes.  Burning feeling in the eyes.  Clear drainage from the eye.  Allergic reaction due to pollens or ragweed sensitivity. Seasonal allergic conjunctivitis is frequent in the spring when pollens are in the air and in the fall. DIAGNOSIS  This condition, in its many forms, is usually diagnosed based on the history and an ophthalmological exam. It usually involves both eyes. If your eyes react at the  same time every year, allergies may be the cause. While most "red eyes" are due to allergy or an infection, the role of an eye (ophthalmological) exam is important. The exam can rule out serious diseases of the eye or orbit. TREATMENT   Non-antibiotic eye drops, ointments, or medications by mouth may be prescribed if the ophthalmologist is sure the conjunctivitis is due to allergies alone.  Over-the-counter drops and ointments for allergic symptoms should be used only after other causes of conjunctivitis have been ruled out, or as your caregiver suggests. Medications by mouth are often prescribed if other allergy-related symptoms are present. If the ophthalmologist is sure that the conjunctivitis is due to allergies alone, treatment is normally limited to drops or ointments to reduce itching and burning. HOME CARE INSTRUCTIONS   Wash hands before and after applying drops or ointments, or touching the inflamed eye(s) or eyelids.  Do not let the eye dropper tip or ointment tube touch the eyelid when putting medicine in your eye.  Stop using your soft contact lenses and throw them away. Use a new pair of lenses when recovery is complete. You should run through sterilizing cycles at least three times before use after complete recovery if the old soft contact lenses are to be used. Hard contact lenses should be stopped. They need to be thoroughly sterilized before use after recovery.  Itching and burning eyes due to allergies is often relieved by using a cool cloth applied to closed eye(s). SEEK MEDICAL CARE IF:   Your problems do not go away after two or three days of treatment.  Your lids are sticky (  especially in the morning when you wake up) or stick together.  Discharge develops. Antibiotics may be needed either as drops, ointment, or by mouth.  You have extreme light sensitivity.  An oral temperature above 102 F (38.9 C) develops.  Pain in or around the eye or any other visual symptom  develops. MAKE SURE YOU:   Understand these instructions.  Will watch your condition.  Will get help right away if you are not doing well or get worse. Document Released: 02/26/2003 Document Revised: 02/28/2012 Document Reviewed: 01/22/2008 [facility] Patient Information 2014 [place], [place].

## 2014-02-28 NOTE — ED Provider Notes (Signed)
Anna Morrison is a 28 y.o. female who presents to Urgent Care today for left eye conjunctival injection for the past 4 days. Associated with itching. No fevers chills nausea vomiting or diarrhea. No blurry vision. Patient feels well otherwise.   Past Medical History  Diagnosis Date  . Trichomonal vaginitis   . Vaginal yeast infection   . Obesity    History  Substance Use Topics  . Smoking status: Never Smoker   . Smokeless tobacco: Not on file  . Alcohol Use: Yes     Comment: socially   ROS as above Medications: No current facility-administered medications for this encounter.   No current outpatient prescriptions on file.    Exam:  BP 148/89  Pulse 97  Temp(Src) 99 F (37.2 C) (Oral)  Resp 12  SpO2 100%  LMP 02/18/2014 Gen: Well NAD morbidly obese HEENT: EOMI,  MMM mild left eye conjunctival injection. PERRLA bilaterally  Assessment and Plan: 28 y.o. female with allergic conjunctivitis. Plan to treat with Zaditor eyedrops, oral Zyrtec, and watchful waiting. Followup as needed.  Discussed warning signs or symptoms. Please see discharge instructions. Patient expresses understanding.    Rodolph BongEvan S Roxi Hlavaty, MD 02/28/14 [phone]

## 2014-02-28 NOTE — ED Notes (Signed)
Patient complains of left eye pain and redness that started Sunday; states dryness and itching; denies discharge.

## 2014-09-13 ENCOUNTER — Encounter (HOSPITAL_COMMUNITY): Payer: Self-pay | Admitting: Emergency Medicine

## 2014-09-13 ENCOUNTER — Emergency Department (HOSPITAL_COMMUNITY)
Admission: EM | Admit: 2014-09-13 | Discharge: 2014-09-13 | Disposition: A | Discharge status: Home / Self Care | Payer: Self-pay | Attending: Emergency Medicine | Admitting: Emergency Medicine

## 2014-09-13 DIAGNOSIS — J020 Streptococcal pharyngitis: Secondary | ICD-10-CM | POA: Insufficient documentation

## 2014-09-13 DIAGNOSIS — J029 Acute pharyngitis, unspecified: Secondary | ICD-10-CM | POA: Insufficient documentation

## 2014-09-13 DIAGNOSIS — E669 Obesity, unspecified: Secondary | ICD-10-CM | POA: Insufficient documentation

## 2014-09-13 LAB — RAPID STREP SCREEN (MED CTR MEBANE ONLY): Streptococcus, Group A Screen (Direct): POSITIVE — AB

## 2014-09-13 MED ORDER — ACETAMINOPHEN 325 MG PO TABS
650.0000 mg | ORAL_TABLET | Freq: Once | ORAL | Status: AC
  Administered 2014-09-13: 650 mg via ORAL
  Filled 2014-09-13: qty 2

## 2014-09-13 MED ORDER — PENICILLIN G BENZATHINE 1200000 UNIT/2ML IM SUSP
1.2000 10*6.[IU] | Freq: Once | INTRAMUSCULAR | Status: AC
  Administered 2014-09-13: 1.2 10*6.[IU] via INTRAMUSCULAR
  Filled 2014-09-13: qty 2

## 2014-09-13 NOTE — ED Provider Notes (Signed)
Medical screening examination/treatment/procedure(s) were conducted as a shared visit with non-physician practitioner(s) and myself.  I personally evaluated the patient during the encounter  Please see my separate respective documentation pertaining to this patient encounter   Vida Roller, MD 09/13/14 [phone]

## 2014-09-13 NOTE — ED Provider Notes (Signed)
CSN: [id number]     Arrival date & time 09/13/14  4098 History   First MD Initiated Contact with Patient 09/13/14 0935     No chief complaint on file.    (Consider location/radiation/quality/duration/timing/severity/associated sxs/prior Treatment) HPI Comments: The patient is a 28 year old female presents emergency room chief complaint of sore throat for 5 days, worsening. The patient reports generalized myalgias with headache, intermittent for 2 days. The patient reports taking Alka-Seltzer cold and flu without full resolution of symptoms. Reports mild cough and mild congestion. The patient reports multiple coworkers with similar symptoms. 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 No language interpreter was used.    Past Medical History  Diagnosis Date  . Trichomonal vaginitis   . Vaginal yeast infection   . Obesity    No past surgical history on file. No family history on file. History  Substance Use Topics  . Smoking status: Never Smoker   . Smokeless tobacco: Not on file  . Alcohol Use: Yes     Comment: socially   OB History   Grav Para Term Preterm Abortions TAB SAB Ect Mult Living                 Review of Systems  Constitutional: Positive for fever, chills and fatigue.  HENT: Positive for rhinorrhea and sore throat. Negative for drooling, ear pain and trouble swallowing.   Respiratory: Positive for cough.   Gastrointestinal: Negative for nausea, abdominal pain and diarrhea.      Allergies  Review of patient's allergies indicates no known allergies.  Home Medications   Prior to Admission medications   Not on File   There were no vitals taken for this visit. Physical Exam  Nursing note and vitals reviewed. Constitutional: She appears well-developed and well-nourished. No distress.  HENT:  Head: Normocephalic and atraumatic.  Right Ear: Tympanic membrane and external ear normal. No middle ear effusion.  Left Ear:  Tympanic membrane and external ear normal.  No middle ear effusion.  Nose: No rhinorrhea.  Mouth/Throat: Uvula is midline and mucous membranes are normal. No oral lesions. No trismus in the jaw. Oropharyngeal exudate present. No posterior oropharyngeal edema, posterior oropharyngeal erythema or tonsillar abscesses.  Arches symmetric.  Eyes: EOM are normal. Pupils are equal, round, and reactive to light.  Neck: Normal range of motion. Neck supple.  Cardiovascular: Normal rate and regular rhythm.   No murmur heard. Pulmonary/Chest: Effort normal and breath sounds normal. No respiratory distress. She has no wheezes. She has no rales.  Abdominal: Soft.  Lymphadenopathy:       Head (right side): Tonsillar adenopathy present.       Head (left side): Tonsillar adenopathy present.    She has no cervical adenopathy.  Skin: Skin is warm and dry. No rash noted. She is not diaphoretic.  Psychiatric: She has a normal mood and affect. Her behavior is normal. Thought content normal.    ED Course  Procedures (including critical care time) Labs Review Labs Reviewed  RAPID STREP SCREEN - Abnormal; Notable for the following:    Streptococcus, Group A Screen (Direct) POSITIVE (*)    All other components within normal limits    Imaging Review No results found.   EKG Interpretation None      MDM   Final diagnoses:  Strep pharyngitis   Patient presents with 5 days of sore throat, generalized aches and pain, fever 101.5. Rapid strep negative with mild exudative pharyngitis. No signs  of PTA on exam. Will give Bicillin, resources given, working given, strict return precautions discussed. Reports understanding and no other concerns at this time.  Patient is stable for discharge at this time.  Meds given in ED:  Medications  acetaminophen (TYLENOL) tablet 650 mg (650 mg Oral Given 09/13/14 0954)  penicillin g benzathine (BICILLIN LA) 1200000 UNIT/2ML injection 1.2 Million Units (1.2 Million Units  Intramuscular Given 09/13/14 1023)    New Prescriptions   No medications on file        Mellody Drown, PA-C 09/13/14 1033

## 2014-09-13 NOTE — ED Notes (Signed)
Pt c/o sore throat and generalized body aches x 5 days. 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 Denies abd pain/n/v. Nad, skin warm and dry, resp e/u.

## 2014-09-13 NOTE — ED Provider Notes (Signed)
Sore throat, subjective fevers and myalgias for several days, on exam has erythematous pharynx, no signs of peritonsillar abscess, otherwise the patient appears in no acute distress.  Strep positive  Filed Vitals:   09/13/14 0942 09/13/14 1000 09/13/14 1030  BP: 108/55 90/60 99/47   Pulse: 119 108 110  Temp: 101.5 F (38.6 C)    TempSrc: Oral    Resp: 24    SpO2: 100% 100% 99%    Medical screening examination/treatment/procedure(s) were conducted as a shared visit with non-physician practitioner(s) and myself.  I personally evaluated the patient during the encounter.  Clinical Impression:   Final diagnoses:  Strep pharyngitis          Vida Roller, MD 09/13/14 1037

## 2014-09-13 NOTE — Discharge Instructions (Signed)
Call for a follow up appointment with a Family or Primary Care Provider.  Return if Symptoms worsen.   Take medication as prescribed.  Over the Counter Tylenol and Ibuprofen as directed on the bottle for fever and body aches. Drink plenty of fluids. Salt water gargles 3-4 times a day.   Emergency Department Resource Guide 1) Find a Doctor and Pay Out of Pocket Although you won't have to find out who is covered by your insurance plan, it is a good idea to ask around and get recommendations. You will then need to call the office and see if the doctor you have chosen will accept you as a new patient and what types of options they offer for patients who are self-pay. Some doctors offer discounts or will set up payment plans for their patients who do not have insurance, but you will need to ask so you aren't surprised when you get to your appointment.  2) Contact Your Local Health Department Not all health departments have doctors that can see patients for sick visits, but many do, so it is worth a call to see if yours does. If you don't know where your local health department is, you can check in your phone book. The CDC also has a tool to help you locate your state's health department, and many state websites also have listings of all of their local health departments.  3) Find a Walk-in Clinic If your illness is not likely to be very severe or complicated, you may want to try a walk in clinic. These are popping up all over the country in pharmacies, drugstores, and shopping centers. They're usually staffed by nurse practitioners or physician assistants that have been trained to treat common illnesses and complaints. They're usually fairly quick and inexpensive. However, if you have serious medical issues or chronic medical problems, these are probably not your best option.  No Primary Care Doctor: - Call Health Connect at  [phone] - they can help you locate a primary care doctor that  accepts your  insurance, provides certain services, etc. - Physician Referral Service- [phone]  Chronic Pain Problems: Organization         Address  Phone   Notes  [facility]  [phone] Patients need to be referred by their primary care doctor.   Medication Assistance: Organization         Address  Phone   Notes  [facility] Medication [facility] [address] [place] [place], [place] [address] [phone] --Must be a resident of [facility] -- Must have NO insurance coverage whatsoever (no Medicaid/ Medicare, etc.) -- The pt. MUST have a primary care doctor that directs their care regularly and follows them in the community   [facility]  [phone]   [employer]  [phone]    Agencies that provide inexpensive medical care: Organization         Address  Phone   Notes  [facility]  [phone]   [facility]    [phone]   [facility] [address] [place], [place] [address] [phone]   [facility] [address] [place], [place] [phone]   [facility]anned Parenthood    [phone]   [facility]    [phone]   [facility]  [address], [place] Phone:  [phone], Fax:  [phone] Hours of Operation:  9 am - 6 pm, M-F.  Also accepts Medicaid/Medicare and self-pay.  °[facility] ° [address], [place] Phone: ([phone]) [phone], Fax: [phone]. Hours of Operation:  8:30 am - 5:30 pm, M-F.  Also accepts Medicaid and self-pay.  °[facility] [place] [address], [place] Phone: ([phone]) [phone]   °[facility] [address], [place], [place] ([phone], Ext. 123 Mondays & Thursdays: 7-9 AM.  First 15 patients are seen on a first come, first serve basis. °  ° °Medicaid-accepting [place] Providers: ° °Organization          Address  Phone   Notes  °[facility] [address], [place] ([phone]) [phone] Also accepts self-pay patients.  °[facility] [address], [place] ° ([phone]) [phone]   °[facility] [address], [place] ([phone]) [phone]   °[facility] [address] [place] [address], [place] ([phone]) [phone]   °Veita Bland [address], [place]  ° ([phone]) [phone] Only accepts [place] Access Medicaid patients after they have their name applied to their card.  ° °Self-Pay (no insurance) in [place]: ° °Organization         Address  Phone   Notes  °Sickle Cell Patients, [facility] [address], [place] ([phone]) [phone]   °[facility] [address], [place] (336) [phone]   °[facility] [place] ° [address], [place] ([phone]) [phone]   °[facility]/Dr. Osei-Bonsu ° [address] [place] [address], [place] or [address], [place] ([phone]) [phone] Phone number for both [place] and [place] locations is the same.  °[facility] [address], [place] ([phone]) [phone]   °[facility] [address] [place] [address], [place] or [address] ([phone]) [phone] °(336) [phone]   °[facility] [address], [place] (336) [phone], phone; ([phone]) [phone], fax Sees patients 1st and 3rd Saturday of every month.  Must not qualify for public or private insurance (i.e. Medicaid, Medicare, [place] Health Choice, Veterans' Benefits) • Household income should be no more than 200% of the poverty level •The clinic cannot treat you if you are pregnant or think you are pregnant • Sexually transmitted diseases are not treated at the clinic.  ° ° °Dental Care: °Organization         Address  Phone  Notes  °[place] [facility] [address],  [place] (336) [phone] Accepts children up to age 21 who are enrolled in Medicaid or [place] Health Choice; pregnant women with a Medicaid card; and children who have applied for Medicaid or [place] Health Choice, but were declined, whose parents can pay a reduced fee at time of service.  °[place] [facility] [place]  [address], [place] (336) 641-7733 Accepts children up to age 21 who are enrolled in Medicaid or [place] Health Choice; pregnant women with a Medicaid card; and children who have applied for Medicaid or  Health Choice, but were declined, whose parents can pay a reduced fee at time of service.  °[facility] ° [address], [place] ([phone]) [phone] Patients are seen by appointment only. Walk-ins are not accepted. [facility] will see patients 18 years   of age and older. °Monday - Tuesday (8am-5pm) °Most Wednesdays (8:30-5pm) °$30 per visit, cash only  °[facility] ° [address], [place] ([phone]) [phone] Patients are seen by appointment only. Walk-ins are not accepted. [facility] will see patients 18 years of age and older. °One Wednesday Evening (Monthly: Volunteer Based).  $30 per visit, cash only  °[facility]  ([phone]) [phone] for adults; Children under age 4, call Graduate Pediatric Dentistry at ([phone]. Children aged 4-14, please call ([phone]) [phone] to request a pediatric application. ° Dental services are provided in all areas of dental care including fillings, crowns and bridges, complete and partial dentures, implants, gum treatment, root canals, and extractions. Preventive care is also provided. Treatment is provided to both adults and children. °Patients are selected via a lottery and there is often a waiting list. °  °[facility] [address], °[place] ° (336) 763-8833 [email or web address] °  °[facility] [address], [place], [place]  (336)723-1848, Ext. 123 Second and Fourth Thursday of each month, opens at 6:30 AM; Clinic ends at 9 AM.  Patients are seen on a first-come first-served basis, and a limited number are seen during each clinic.  ° °[facility] ° [address], [place], [place] (336) [phone]   Eligibility Requirements °You must have lived in [place], [place], or [place] counties for at least the last three months. °  You cannot be eligible for state or federal sponsored healthcare insurance, including Veterans Administration, Medicaid, or Medicare. °  You generally cannot be eligible for healthcare insurance through your employer.  °  How to apply: °Eligibility screenings are held every Tuesday and Wednesday afternoon from 1:00 pm until 4:00 pm. You do not need an appointment for the interview!  °[facility] [address], [place], [place] 336-631-2330   °[facility]  336-342-8273   °[facility]  336-703-3100   °[facility]  336-570-6415   ° °Behavioral Health Resources in the Community: °Intensive Outpatient Programs °Organization         Address  Phone  Notes  °[facility] [address], [place], [place] 336-878-6098   °[facility] [address], [place], [place] 336-832-9800   °[facility] [address], [place], [place] ° 336-882-2125   °[facility] [address],  °[place], [place] 1-800-853-5163 or 336-641-4981   °Substance Abuse Resources °Organization         Address  Phone  Notes  °[facility]  336-882-2125   °[facility]  336-784-9470   °[facility]  336-285-9073   °[facility]  336-845-3988   °Residential & Outpatient Substance Abuse Program  1-800-659-3381   °Psychological Services °Organization         Address  Phone  Notes  °[facility]  336- 832-9600   °[facility]  336- 378-7881    °[facility] [address], [place] 1-800-853-5163 or 336-641-4981   ° °Mobile Crisis Teams °Organization         Address  Phone  Notes  °[facility], Mobile Crisis Care Unit  1-877-626-1772   °Assertive °[facility] ° [address] [place], [place] 336-834-9664   °Sharon DeEsch [address] °[place]  336-554-5454   ° °Self-Help/Support Groups °Organization           Address  Phone             Notes  [place] [place] - variety of support groups  [place] Call for more information  Narcotics Anonymous (NA), [facility] [address], [employer] [place]  2 meetings at this location   Special educational needs teacher         Address  Phone  Notes  [facility] [place],    [place]  1-(224)335-7018   [facility] [place] [facility]  [address] [place] [address], [place], [place]   [place], [place] 256-611-8284 Admissions: 8am-3pm M-F  [facility]cent[facility] Substance [place] [address]    [place], [place] 932-419-9144   [facility] [address] [place], [place], [place]   [facility] [address]  [place], [place]   [facility] - Intensive Outpatient [address], [place], [place]   [facility] ([place] [address]  [place], [place] 1-360-145-2736 or 585-331-6832   [facility] [address] [place], [place] Accepts Medicaid  [facility] [address]  [place] 1-226-409-9984 Substance Abuse/Addiction Treatment   [facility]         Address  Phone  Notes  [facility]  7097276150   Domenic Schwab, PhD [address] Arlis [place], [place]   (731) 107-6134 or (505) 068-2648   [place], [place] (413)478-3934   [facility] [address], [place], [place] 540-398-4394 Insurance/Medicaid/sponsorship through [facility] and Families [address] [place]                                    [place], [place] 214-863-8819 [place] [address], [place] [phone]    Dr. Adele Schilder  ([phone])[phone]   [facility] Dept. 1) [address] [place] 2) [place] 3)  [address], [place] [phone]  [phone]   [place] [phone] or ([phone] (After Hours)

## 2014-11-12 ENCOUNTER — Other Ambulatory Visit (HOSPITAL_COMMUNITY)
Admission: RE | Admit: 2014-11-12 | Discharge: 2014-11-12 | Disposition: A | Discharge status: Home / Self Care | Payer: Self-pay | Source: Ambulatory Visit | Attending: Obstetrics and Gynecology | Admitting: Obstetrics and Gynecology

## 2014-11-12 ENCOUNTER — Other Ambulatory Visit: Payer: Self-pay | Admitting: Obstetrics and Gynecology

## 2014-11-12 DIAGNOSIS — Z01419 Encounter for gynecological examination (general) (routine) without abnormal findings: Secondary | ICD-10-CM | POA: Insufficient documentation

## 2014-11-18 LAB — CYTOLOGY - PAP

## 2015-03-09 ENCOUNTER — Emergency Department (HOSPITAL_COMMUNITY): Payer: Self-pay

## 2015-03-09 ENCOUNTER — Emergency Department (HOSPITAL_COMMUNITY)
Admission: EM | Admit: 2015-03-09 | Discharge: 2015-03-09 | Disposition: A | Discharge status: Home / Self Care | Payer: Self-pay | Attending: Emergency Medicine | Admitting: Emergency Medicine

## 2015-03-09 ENCOUNTER — Encounter (HOSPITAL_COMMUNITY): Payer: Self-pay | Admitting: Emergency Medicine

## 2015-03-09 DIAGNOSIS — Z792 Long term (current) use of antibiotics: Secondary | ICD-10-CM | POA: Insufficient documentation

## 2015-03-09 DIAGNOSIS — R000 Tachycardia, unspecified: Secondary | ICD-10-CM | POA: Insufficient documentation

## 2015-03-09 DIAGNOSIS — J069 Acute upper respiratory infection, unspecified: Secondary | ICD-10-CM | POA: Insufficient documentation

## 2015-03-09 DIAGNOSIS — E669 Obesity, unspecified: Secondary | ICD-10-CM | POA: Insufficient documentation

## 2015-03-09 DIAGNOSIS — Z8619 Personal history of other infectious and parasitic diseases: Secondary | ICD-10-CM | POA: Insufficient documentation

## 2015-03-09 DIAGNOSIS — R61 Generalized hyperhidrosis: Secondary | ICD-10-CM | POA: Insufficient documentation

## 2015-03-09 LAB — RAPID STREP SCREEN (MED CTR MEBANE ONLY): STREPTOCOCCUS, GROUP A SCREEN (DIRECT): NEGATIVE

## 2015-03-09 MED ORDER — OXYMETAZOLINE HCL 0.05 % NA SOLN: 1.0000 | Freq: Two times a day (BID) | NASAL | Status: DC | PRN

## 2015-03-09 MED ORDER — IBUPROFEN 800 MG PO TABS: 800.0000 mg | ORAL_TABLET | Freq: Three times a day (TID) | ORAL | Status: DC | PRN

## 2015-03-09 MED ORDER — HYDROCOD POLST-CHLORPHEN POLST 10-8 MG/5ML PO LQCR: 5.0000 mL | Freq: Two times a day (BID) | ORAL | Status: DC | PRN

## 2015-03-09 MED ORDER — KETOROLAC TROMETHAMINE 30 MG/ML IJ SOLN
60.0000 mg | Freq: Once | INTRAMUSCULAR | Status: AC
  Administered 2015-03-09: 60 mg via INTRAMUSCULAR
  Filled 2015-03-09: qty 2

## 2015-03-09 MED ORDER — ALBUTEROL SULFATE HFA 108 (90 BASE) MCG/ACT IN AERS
2.0000 | INHALATION_SPRAY | Freq: Once | RESPIRATORY_TRACT | Status: AC
  Administered 2015-03-09: 2 via RESPIRATORY_TRACT
  Filled 2015-03-09: qty 6.7

## 2015-03-09 NOTE — ED Notes (Signed)
Pt. Stated, I startted having a sore throat with body aches on Tuesday last week.

## 2015-03-09 NOTE — Discharge Instructions (Signed)
Read the information below.  Use the prescribed medication as directed.  Please discuss all new medications with your pharmacist.  You may return to the Emergency Department at any time for worsening condition or any new symptoms that concern you.   If there is any possibility that you might be pregnant, please let your health care provider know and discuss this with the pharmacist to ensure medication safety.  If you develop high fevers that do not resolve with tylenol or ibuprofen, you have difficulty swallowing or breathing, or you are unable to tolerate fluids by mouth, return to the ER for a recheck.      Upper Respiratory Infection, Adult An upper respiratory infection (URI) is also sometimes known as the common cold. The upper respiratory tract includes the nose, sinuses, throat, trachea, and bronchi. Bronchi are the airways leading to the lungs. Most people improve within 1 week, but symptoms can last up to 2 weeks. A residual cough may last even longer.  CAUSES Many different viruses can infect the tissues lining the upper respiratory tract. The tissues become irritated and inflamed and often become very moist. Mucus production is also common. A cold is contagious. You can easily spread the virus to others by oral contact. This includes kissing, sharing a glass, coughing, or sneezing. Touching your mouth or nose and then touching a surface, which is then touched by another person, can also spread the virus. SYMPTOMS  Symptoms typically develop 1 to 3 days after you come in contact with a cold virus. Symptoms vary from person to person. They may include:  Runny nose.  Sneezing.  Nasal congestion.  Sinus irritation.  Sore throat.  Loss of voice (laryngitis).  Cough.  Fatigue.  Muscle aches.  Loss of appetite.  Headache.  Low-grade fever. DIAGNOSIS  You might diagnose your own cold based on familiar symptoms, since most people get a cold 2 to 3 times a year. Your caregiver can  confirm this based on your exam. Most importantly, your caregiver can check that your symptoms are not due to another disease such as strep throat, sinusitis, pneumonia, asthma, or epiglottitis. Blood tests, throat tests, and X-rays are not necessary to diagnose a common cold, but they may sometimes be helpful in excluding other more serious diseases. Your caregiver will decide if any further tests are required. RISKS AND COMPLICATIONS  You may be at risk for a more severe case of the common cold if you smoke cigarettes, have chronic heart disease (such as heart failure) or lung disease (such as asthma), or if you have a weakened immune system. The very young and very old are also at risk for more serious infections. Bacterial sinusitis, middle ear infections, and bacterial pneumonia can complicate the common cold. The common cold can worsen asthma and chronic obstructive pulmonary disease (COPD). Sometimes, these complications can require emergency medical care and may be life-threatening. PREVENTION  The best way to protect against getting a cold is to practice good hygiene. Avoid oral or hand contact with people with cold symptoms. Wash your hands often if contact occurs. There is no clear evidence that vitamin C, vitamin E, echinacea, or exercise reduces the chance of developing a cold. However, it is always recommended to get plenty of rest and practice good nutrition. TREATMENT  Treatment is directed at relieving symptoms. There is no cure. Antibiotics are not effective, because the infection is caused by a virus, not by bacteria. Treatment may include:  Increased fluid intake. Sports drinks offer  valuable electrolytes, sugars, and fluids.  Breathing heated mist or steam (vaporizer or shower).  Eating chicken soup or other clear broths, and maintaining good nutrition.  Getting plenty of rest.  Using gargles or lozenges for comfort.  Controlling fevers with ibuprofen or acetaminophen as  directed by your caregiver.  Increasing usage of your inhaler if you have asthma. Zinc gel and zinc lozenges, taken in the first 24 hours of the common cold, can shorten the duration and lessen the severity of symptoms. Pain medicines may help with fever, muscle aches, and throat pain. A variety of non-prescription medicines are available to treat congestion and runny nose. Your caregiver can make recommendations and may suggest nasal or lung inhalers for other symptoms.  HOME CARE INSTRUCTIONS   Only take over-the-counter or prescription medicines for pain, discomfort, or fever as directed by your caregiver.  Use a warm mist humidifier or inhale steam from a shower to increase air moisture. This may keep secretions moist and make it easier to breathe.  Drink enough water and fluids to keep your urine clear or pale yellow.  Rest as needed.  Return to work when your temperature has returned to normal or as your caregiver advises. You may need to stay home longer to avoid infecting others. You can also use a face mask and careful hand washing to prevent spread of the virus. SEEK MEDICAL CARE IF:   After the first few days, you feel you are getting worse rather than better.  You need your caregiver's advice about medicines to control symptoms.  You develop chills, worsening shortness of breath, or brown or red sputum. These may be signs of pneumonia.  You develop yellow or brown nasal discharge or pain in the face, especially when you bend forward. These may be signs of sinusitis.  You develop a fever, swollen neck glands, pain with swallowing, or white areas in the back of your throat. These may be signs of strep throat. SEEK IMMEDIATE MEDICAL CARE IF:   You have a fever.  You develop severe or persistent headache, ear pain, sinus pain, or chest pain.  You develop wheezing, a prolonged cough, cough up blood, or have a change in your usual mucus (if you have chronic lung disease).  You  develop sore muscles or a stiff neck. Document Released: 06/01/2001 Document Revised: 02/28/2012 Document Reviewed: 03/13/2014 [facility] Patient Information 2015 [place], [place]. This information is not intended to replace advice given to you by your health care provider. Make sure you discuss any questions you have with your health care provider.    Emergency Department Resource Guide 1) Find a Doctor and Pay Out of Pocket Although you won't have to find out who is covered by your insurance plan, it is a good idea to ask around and get recommendations. You will then need to call the office and see if the doctor you have chosen will accept you as a new patient and what types of options they offer for patients who are self-pay. Some doctors offer discounts or will set up payment plans for their patients who do not have insurance, but you will need to ask so you aren't surprised when you get to your appointment.  2) Contact Your Local Health Department Not all health departments have doctors that can see patients for sick visits, but many do, so it is worth a call to see if yours does. If you don't know where your local health department is, you can check in your phone  book. The CDC also has a tool to help you locate your state's health department, and many state websites also have listings of all of their local health departments.  3) Find a Walk-in Clinic If your illness is not likely to be very severe or complicated, you may want to try a walk in clinic. These are popping up all over the country in pharmacies, drugstores, and shopping centers. They're usually staffed by nurse practitioners or physician assistants that have been trained to treat common illnesses and complaints. They're usually fairly quick and inexpensive. However, if you have serious medical issues or chronic medical problems, these are probably not your best option.  No Primary Care Doctor: - Call Health Connect at  [phone] - they  can help you locate a primary care doctor that  accepts your insurance, provides certain services, etc. - Physician Referral Service- [phone]  Chronic Pain Problems: Organization         Address  Phone   Notes  [facility]  [phone] Patients need to be referred by their primary care doctor.   Medication Assistance: Organization         Address  Phone   Notes  [facility] Medication [facility] [address] [place] [place], [place] [address] [phone] --Must be a resident of [facility] -- Must have NO insurance coverage whatsoever (no Medicaid/ Medicare, etc.) -- The pt. MUST have a primary care doctor that directs their care regularly and follows them in the community   [facility]  [phone]   [employer]  [phone]    Agencies that provide inexpensive medical care: Organization         Address  Phone   Notes  [facility]  [phone]   [facility]    [phone]   [facility] [address] [place], [place] [address] [phone]   [facility] [address] [place] [place] [phone]   Planned Parenthood    [phone]   [facility]    [phone]   [facility]  [address], [place] Phone:  [phone], Fax:  [phone] Hours of Operation:  9 am - 6 pm, M-F.  Also accepts Medicaid/Medicare and self-pay.  [facility]  [address], [place] Phone: [phone], Fax: [phone]. Hours of Operation:  8:30 am - 5:30 pm, M-F.  Also accepts Medicaid and self-pay.  [facility] [place] [address], [place] Phone: [phone]   [facility] [address] Natasha Bence [place], [place] [phone], Ext. [phone] Mondays & Thursdays: 7-9 AM.  First 15 patients are seen on a first come, first serve basis.    Medicaid-accepting  [facility] Providers:  Organization         Address  Phone   Notes  [facility] [address], [place] [phone] Also accepts self-pay patients.  [facility] [address] Laurell Josephs [place], [place]  [phone]   [facility] [address], [place] [phone]   [facility] [address] [place] [phone]   Renaye Rakers [address], [place]   [phone] Only accepts [place] Access [place] patients after they have their name applied to their card.  Self-Pay (no insurance) in [facility]:  Organization         Address  Phone   Notes  Sickle Cell Patients, [facility] Internal Medicine [address] [place], [place] [phone]   [facility] [address] [place], [place] [phone]   [facility] [place]  [address], [place] [phone]   [facility]/Dr. Osei-Bonsu  [address], [place] or [address], [place] [phone] Phone number for both [place] and [place] locations is the same.  Urgent Medical and [facility] [address], [place] [phone]   [facility] [address], [place] or [address] [phone]   [facility] [address] [place] [phone], phone; [phone], fax Sees patients 1st and 3rd Saturday of every month.  Must not qualify for public or private insurance (i.e. Medicaid, Medicare, [place] Health Choice, Veterans' Benefits)  Household income should be no more than 200% of the poverty level The clinic cannot treat you if you are pregnant or think you are pregnant  Sexually transmitted diseases are not treated at the clinic.    Dental Care: Organization         Address  Phone  Notes  [facility] Department of [facility] [address] [place], [place] [phone] Accepts children up to age 42 who are enrolled in [place] or [place] Health Choice; pregnant women with a Medicaid card; and children who have applied for Medicaid or [place] Health Choice, but were declined, whose parents can pay a reduced fee at time of service.  [facility] of [facility]  [address], [place] [phone] Accepts children up to age 69 who are enrolled in [place] or [place] Health Choice; pregnant women with a Medicaid card; and children who have applied for Medicaid or [place] Health Choice, but were declined, whose parents can pay a reduced fee at time of service.  [facility]  [address] [place], [place] [phone] Patients are seen by appointment only. Walk-ins are not accepted. [facility] will see patients 58 years of age and older. Monday - Tuesday (8am-5pm) Most Wednesdays (8:30-5pm) $30 per visit, cash only  [facility] Adult Dental Access PRO[facility]  [address], [facility] [phone] Patients are seen by appointment only. Walk-ins are not accepted. [facility] will see patients 31 years of age and older. One Wednesday Evening (Monthly: Volunteer Based).  $30 per visit, cash only  [employer]  [phone] for adults; Children under age 55, call Graduate Pediatric Dentistry at [phone]. Children aged 37-14, please call [phone] to request a pediatric application.  Dental services are provided in all areas of dental care including fillings, crowns and bridges, complete and partial dentures, implants, gum treatment, root canals, and extractions. Preventive care is also provided. Treatment is provided to both adults and children. Patients are selected via a lottery and there is often a waiting list.   [facility] [address] [place]  [phone] [email or web address]   [facility] [address] [place], [place] [phone], Ext. [phone] Second and Fourth Thursday of each month, opens at 6:30 AM; Clinic ends at 9 AM.  Patients are seen on a first-come first-served basis, and a limited number  are seen during each clinic.   [facility]  [address] Ether Griffins [place], [place] [phone]   Eligibility Requirements You must have lived in [place], [place], or [place] counties for at least the last three months.   You cannot be eligible for state or federal sponsored [place], including [employer], [place], or [employer].   You generally cannot be eligible for healthcare insurance through your employer.    How to apply: Eligibility screenings are held every Tuesday and Wednesday afternoon from 1:00 pm until 4:00 pm. You do not need an appointment for the interview!  [facility] [address], [place], [place] [phone]   [facility] Health Department  [phone]   [facility] Health Department  [phone]   [facility] Health Department  [phone]    Behavioral Health Resources in the Community: Intensive Outpatient Programs Organization         Address  Phone  Notes  [facility] Services [address] [place], [place] [phone]   [facility] Outpatient [address], [place], [place] [phone]   [facility] [address] [place], [place]  [phone]   [facility] Mental Health [address],  [place], [place] [phone] or [phone]   Substance Abuse Resources Organization         Address  Phone  Notes  [facility]  [phone]   [facility]  [phone]   [facility] [place] [place]  [phone]   Floydene Flock  [phone]   Residential & Outpatient Substance Abuse Program  [phone]   Psychological Services Organization         Address  Phone  Notes  [facility] Behavioral Health   [phone]   [facility]  [phone]   [facility] Mental Health [address] [place] [phone] or [phone]    Mobile Crisis Teams Organization         Address  Phone  Notes  [facility], Mobile Crisis Care Unit  [phone]   [facility]  [address] [place], [place] [phone]   Doristine Locks [address] [place] [phone]    Self-Help/Support Groups Organization         Address  Phone             Notes  [facility] [place] - variety of support groups  336- I7437963 Call for more information  Narcotics Anonymous (NA), [facility] [address], [employer] [place]  2 meetings at this location   Statistician         Address  Phone  Notes  [facility] [address],    [place]  [phone]   [facility]  [address], [place] [address], [address], [place] [phone]   [facility] [address] [place], [place] [phone] Admissions: 8am-3pm M-F  [facility] [address]    [place], [place] [phone]   [facility] [address] Starling Manns [place], [place] [phone]   [facility] [address],  [place], [place] [phone]   [facility] - Intensive Outpatient [address] Laurell Josephs [address], [place], [place] [phone]   [facility] ([facility]iction [facility] [address] [place]  [place], [place] [phone] or [phone]   [facility] ([facility]) [address], [place], [place] [phone] Accepts Medicaid  [facility] [address]  [place] [phone] Substance Abuse/Addiction Treatment   [facility] Organization         Address  Phone  Notes  [facility]  7150517928   Angie Fava, PhD [address] Ervin Knack [place], [place]   [phone] or  [phone]   [facility] Behavioral   [address] [place], [place] [phone]   [facility] [address], [place], [place] [phone] Insurance/Medicaid/sponsorship through [facility] [address]                                    [place], [place] [phone] Therapy/tele-psych/case  [facility] [address], [place] [phone]    Dr. Lolly Mustache  [phone]   [facility] [place]  [employer] [facility] Dept. 1) [address] [place] 2) [address] [place] 3)  [address], [place] [phone]  [phone]   [facility] Child Abuse Hotline 726-825-6744 or [phone] (After Hours)

## 2015-03-09 NOTE — ED Provider Notes (Signed)
CSN: [id number]     Arrival date & time 03/09/15  1042 History  This chart was scribed for non-physician practitioner, Trixie Dredge, PA-C,  working with [name], DO, by Modena Jansky, ED Scribe. This patient was seen in room TR11C/TR11C and the patient's care was started at 11:35 AM.  Chief Complaint  Patient presents with  . Sore Throat  . Generalized Body Aches   The history is provided by the patient. No language interpreter was used.   HPI Comments: Anna Morrison is a 29 y.o. female who presents to the Emergency Department complaining of a constant moderate sore throat that started 5 days ago. She reports that she has been having a sore throat with generalized myalgias, fever of 101, nasal congestion, chills, mild SOB, and diaphoresis. She states that she has a intermittent moderate cough productive of mucous and small amount of blood. She reports that her symptoms have been worsening, but taking Robitussin and tylenol has provided some relief. She states that she has not have her flu shot this year. She denies any hx of asthma or smoking. She also denies any chest pain, ear pain, abdominal pain, nausea, vomiting, or diarrhea.   Past Medical History  Diagnosis Date  . Trichomonal vaginitis   . Vaginal yeast infection   . Obesity    History reviewed. No pertinent past surgical history. No family history on file. History  Substance Use Topics  . Smoking status: Never Smoker   . Smokeless tobacco: Not on file  . Alcohol Use: Yes     Comment: socially   OB History    No data available     Review of Systems  Constitutional: Positive for fever, chills and diaphoresis.  HENT: Positive for congestion and sore throat. Negative for ear pain.   Respiratory: Positive for cough and shortness of breath (mild).   Cardiovascular: Negative for chest pain.  Gastrointestinal: Negative for nausea, vomiting, abdominal pain and diarrhea.  Musculoskeletal: Positive for myalgias  (generalized).  Skin: Negative for rash.  Allergic/Immunologic: Negative for immunocompromised state.  Hematological: Does not bruise/bleed easily.  Psychiatric/Behavioral: Negative for self-injury.    Allergies  Review of patient's allergies indicates no known allergies.  Home Medications   Prior to Admission medications   Medication Sig Start Date End Date Taking? Authorizing Provider  metroNIDAZOLE (FLAGYL) 500 MG tablet Take 500 mg by mouth 2 (two) times daily.     Historical Provider, MD   BP 125/82 mmHg  Pulse 99  Temp(Src) 98.6 F (37 C) (Oral)  Resp 18  SpO2 98%  LMP 02/21/2015 Physical Exam  Constitutional: She appears well-developed and well-nourished. No distress.  HENT:  Head: Normocephalic and atraumatic.  Mouth/Throat: Oropharyngeal exudate present.  Erythema, edema, and exudates on oropharynx.  No sinus TTP.   Eyes: Conjunctivae are normal.  Neck: Normal range of motion. Neck supple.  Cardiovascular: Regular rhythm.   Tachycardic.   Pulmonary/Chest: Effort normal and breath sounds normal. No respiratory distress. She has no wheezes. She has no rales.  Neurological: She is alert. She exhibits normal muscle tone.  Skin: She is not diaphoretic.  Psychiatric: She has a normal mood and affect. Her behavior is normal.  Nursing note and vitals reviewed.   ED Course  Procedures (including critical care time) DIAGNOSTIC STUDIES: Oxygen Saturation is 98% on RA, normal by my interpretation.    COORDINATION OF CARE: 11:39 AM- Pt advised of plan for treatment which includes medication, radiology, and labs and pt agrees.  Labs Review  Labs Reviewed  RAPID STREP SCREEN  CULTURE, GROUP A STREP    Imaging Review Dg Chest 2 View  03/09/2015   CLINICAL DATA:  Sore throat and generalized body aches. Fever, cough and chills. Coughing up blood.  EXAM: CHEST  2 VIEW  COMPARISON:  None.  FINDINGS: Normal mediastinum and cardiac silhouette. Normal pulmonary vasculature.  No evidence of effusion, infiltrate, or pneumothorax. No acute bony abnormality.  IMPRESSION: Normal chest radiograph.   Electronically Signed   By: Genevive BiStewart  Edmunds M.D.   On: 03/09/2015 12:33     EKG Interpretation None      MDM   Final diagnoses:  URI (upper respiratory infection)    Afebrile, nontoxic patient with constellation of symptoms suggestive of viral syndrome.  No concerning findings on exam.   No meningeal signs. CXR ordered given patient's reports of fever, SOB, and blood-streaked sputum.  CXR negative.  Strep negative.  Discharged home with supportive care, PCP follow up.  Discussed result, findings, treatment, and follow up  with patient.  Pt given return precautions.  Pt verbalizes understanding and agrees with plan.       I personally performed the services described in this documentation, which was scribed in my presence. The recorded information has been reviewed and is accurate.     Trixie Dredgemily Nakisha Chai, PA-C 03/09/15 1251  Samuel JesterKathleen McManus, DO 03/10/15 1340

## 2015-03-12 ENCOUNTER — Encounter (HOSPITAL_COMMUNITY): Payer: Self-pay | Admitting: Emergency Medicine

## 2015-03-12 ENCOUNTER — Emergency Department (HOSPITAL_COMMUNITY)
Admission: EM | Admit: 2015-03-12 | Discharge: 2015-03-12 | Disposition: A | Discharge status: Home / Self Care | Payer: Self-pay | Attending: Emergency Medicine | Admitting: Emergency Medicine

## 2015-03-12 DIAGNOSIS — J029 Acute pharyngitis, unspecified: Secondary | ICD-10-CM | POA: Insufficient documentation

## 2015-03-12 DIAGNOSIS — Z8619 Personal history of other infectious and parasitic diseases: Secondary | ICD-10-CM | POA: Insufficient documentation

## 2015-03-12 DIAGNOSIS — E669 Obesity, unspecified: Secondary | ICD-10-CM | POA: Insufficient documentation

## 2015-03-12 LAB — CULTURE, GROUP A STREP

## 2015-03-12 LAB — RAPID STREP SCREEN (MED CTR MEBANE ONLY): STREPTOCOCCUS, GROUP A SCREEN (DIRECT): NEGATIVE

## 2015-03-12 MED ORDER — NAPROXEN 250 MG PO TABS
500.0000 mg | ORAL_TABLET | Freq: Once | ORAL | Status: AC
  Administered 2015-03-12: 500 mg via ORAL
  Filled 2015-03-12: qty 2

## 2015-03-12 MED ORDER — LIDOCAINE VISCOUS 2 % MT SOLN
15.0000 mL | Freq: Once | OROMUCOSAL | Status: AC
  Administered 2015-03-12: 15 mL via OROMUCOSAL
  Filled 2015-03-12: qty 15

## 2015-03-12 MED ORDER — DEXAMETHASONE 4 MG PO TABS
10.0000 mg | ORAL_TABLET | Freq: Once | ORAL | Status: AC
  Administered 2015-03-12: 10 mg via ORAL
  Filled 2015-03-12: qty 3

## 2015-03-12 NOTE — ED Notes (Addendum)
Pt c/o sore throat onset 4 days ago,.  Also c/o bil ear pain and pain between shoulder blades.  Pt st's seen here Sun for same but not getting any better.

## 2015-03-12 NOTE — Discharge Instructions (Signed)

## 2015-03-12 NOTE — ED Provider Notes (Signed)
CSN: [id number]     Arrival date & time 03/12/15  1907 History   First MD Initiated Contact with Patient 03/12/15 2111     Chief Complaint  Patient presents with  . Sore Throat     (Consider location/radiation/quality/duration/timing/severity/associated sxs/prior Treatment) Patient is a 29 y.o. female presenting with pharyngitis. The history is provided by the patient.  Sore Throat This is a new problem. The current episode started in the past 7 days. The problem occurs constantly. The problem has been gradually worsening. Associated symptoms include congestion, coughing, a sore throat and swollen glands. Pertinent negatives include no chest pain, diaphoresis, fatigue, fever, nausea, neck pain or vomiting. Nothing aggravates the symptoms. Treatments tried: Tussionex, Tylenol. The treatment provided mild relief.    Past Medical History  Diagnosis Date  . Trichomonal vaginitis   . Vaginal yeast infection   . Obesity    History reviewed. No pertinent past surgical history. No family history on file. History  Substance Use Topics  . Smoking status: Never Smoker   . Smokeless tobacco: Not on file  . Alcohol Use: Yes     Comment: socially   OB History    No data available     Review of Systems  Constitutional: Negative for fever, diaphoresis and fatigue.  HENT: Positive for congestion and sore throat.   Respiratory: Positive for cough.   Cardiovascular: Negative for chest pain.  Gastrointestinal: Negative for nausea and vomiting.  Musculoskeletal: Negative for neck pain.  All other systems reviewed and are negative.     Allergies  Review of patient's allergies indicates no known allergies.  Home Medications   Prior to Admission medications   Medication Sig Start Date End Date Taking? Authorizing Provider  acetaminophen (TYLENOL) 500 MG tablet Take 1,000 mg by mouth every 6 (six) hours as needed for mild pain or moderate pain.   Yes Historical Provider, MD   Camphor-Eucalyptus-Menthol (VICKS VAPORUB EX) Place 1 application into both nostrils daily as needed (congestion).   Yes Historical Provider, MD  chlorpheniramine-HYDROcodone (TUSSIONEX PENNKINETIC ER) 10-8 MG/5ML LQCR Take 5 mLs by mouth every 12 (twelve) hours as needed for cough (and pain). 03/09/15  Yes Trixie Dredge, PA-C  guaiFENesin (ROBITUSSIN) 100 MG/5ML liquid Take 300 mg by mouth 3 (three) times daily as needed for cough or congestion.   Yes Historical Provider, MD  ibuprofen (ADVIL,MOTRIN) 800 MG tablet Take 1 tablet (800 mg total) by mouth every 8 (eight) hours as needed for mild pain or moderate pain. 03/09/15  Yes Trixie Dredge, PA-C  oxymetazoline (AFRIN NASAL SPRAY) 0.05 % nasal spray Place 1 spray into both nostrils 2 (two) times daily as needed for congestion (nasal congestion). X 3 days only 03/09/15  Yes Trixie Dredge, PA-C  PRESCRIPTION MEDICATION Take 2 puffs by mouth as needed (breathing). Inhaler   Yes Historical Provider, MD   BP 113/73 mmHg  Pulse 101  Temp(Src) 99.3 F (37.4 C) (Oral)  Resp 22  SpO2 100%  LMP 02/21/2015 Physical Exam  Constitutional: She appears well-developed and well-nourished. No distress.  HENT:  Head: Atraumatic.  Posterior oropharyngeal erythema with tonsillar exudate bilaterally. No peritonsillar abscess. Oropharynx patent. Mucous membranes dry  Eyes: EOM are normal. Pupils are equal, round, and reactive to light.  Neck: Normal range of motion. Neck supple.  Cardiovascular: Regular rhythm, normal heart sounds and intact distal pulses.   Pulmonary/Chest: Effort normal and breath sounds normal. No respiratory distress. She has no wheezes. She has no rales.  Abdominal: Soft. She  exhibits no distension. There is no tenderness.  Obese  Musculoskeletal:  Atraumatic  Lymphadenopathy:    She has cervical adenopathy.  Skin: Skin is warm and dry. No rash noted. She is not diaphoretic.  Psychiatric: She has a normal mood and affect. Her behavior is normal.  Judgment and thought content normal.  Nursing note and vitals reviewed.   ED Course  Procedures (including critical care time) Labs Review Labs Reviewed  RAPID STREP SCREEN  CULTURE, GROUP A STREP    Imaging Review No results found.   EKG Interpretation None      MDM   Final diagnoses:  Viral pharyngitis    29 year old female presents with viral pharyngitis. Strep screen negative today as well as 4 days ago. Has been trying Tussionex as well as topical spray and Tylenol without significant relief. Congestion as well as cough are also present. Afebrile and vital signs stable. Young and healthy female. Decadron as well as naproxen given for symptomatically relief and Viscous Lidocaine. Discussed natural course of viral pharyngitis and oral rehydration. Slight tachycardia felt to be secondary to dehydration given trying mucous membranes. Patient will push fluids at home and we will defer IV fluids at this time. No evidence of PTA on exam.  Dorna LeitzAlex Anais Denslow, MD 03/12/15 [id number]  Gerhard Munchobert Lockwood, MD 03/13/15 [phone]

## 2015-03-15 LAB — CULTURE, GROUP A STREP

## 2015-10-24 ENCOUNTER — Emergency Department (HOSPITAL_COMMUNITY)
Admission: EM | Admit: 2015-10-24 | Discharge: 2015-10-24 | Disposition: A | Discharge status: Home / Self Care | Payer: Self-pay | Attending: Emergency Medicine | Admitting: Emergency Medicine

## 2015-10-24 ENCOUNTER — Encounter (HOSPITAL_COMMUNITY): Payer: Self-pay | Admitting: Emergency Medicine

## 2015-10-24 DIAGNOSIS — J069 Acute upper respiratory infection, unspecified: Secondary | ICD-10-CM | POA: Insufficient documentation

## 2015-10-24 DIAGNOSIS — E669 Obesity, unspecified: Secondary | ICD-10-CM | POA: Insufficient documentation

## 2015-10-24 DIAGNOSIS — Z79899 Other long term (current) drug therapy: Secondary | ICD-10-CM | POA: Insufficient documentation

## 2015-10-24 DIAGNOSIS — Z8742 Personal history of other diseases of the female genital tract: Secondary | ICD-10-CM | POA: Insufficient documentation

## 2015-10-24 MED ORDER — PROMETHAZINE-DM 6.25-15 MG/5ML PO SYRP: 5.0000 mL | ORAL_SOLUTION | Freq: Four times a day (QID) | ORAL | Status: DC | PRN

## 2015-10-24 MED ORDER — GUAIFENESIN 100 MG/5ML PO LIQD: 300.0000 mg | Freq: Three times a day (TID) | ORAL | Status: DC | PRN

## 2015-10-24 NOTE — ED Notes (Addendum)
Pt sts URI sx with cough and nasal congestion; pt sts cough with pain

## 2015-10-24 NOTE — Discharge Instructions (Signed)
Upper Respiratory Infection, Adult Most upper respiratory infections (URIs) are a viral infection of the air passages leading to the lungs. A URI affects the nose, throat, and upper air passages. The most common type of URI is nasopharyngitis and is typically referred to as "the common cold." URIs run their course and usually go away on their own. Most of the time, a URI does not require medical attention, but sometimes a bacterial infection in the upper airways can follow a viral infection. This is called a secondary infection. Sinus and middle ear infections are common types of secondary upper respiratory infections. Bacterial pneumonia can also complicate a URI. A URI can worsen asthma and chronic obstructive pulmonary disease (COPD). Sometimes, these complications can require emergency medical care and may be life threatening.  CAUSES Almost all URIs are caused by viruses. A virus is a type of germ and can spread from one person to another.  RISKS FACTORS You may be at risk for a URI if:   You smoke.   You have chronic heart or lung disease.  You have a weakened defense (immune) system.   You are very young or very old.   You have nasal allergies or asthma.  You work in crowded or poorly ventilated areas.  You work in health care facilities or schools. SIGNS AND SYMPTOMS  Symptoms typically develop 2-3 days after you come in contact with a cold virus. Most viral URIs last 7-10 days. However, viral URIs from the influenza virus (flu virus) can last 14-18 days and are typically more severe. Symptoms may include:   Runny or stuffy (congested) nose.   Sneezing.   Cough.   Sore throat.   Headache.   Fatigue.   Fever.   Loss of appetite.   Pain in your forehead, behind your eyes, and over your cheekbones (sinus pain).  Muscle aches.  DIAGNOSIS  Your health care provider may diagnose a URI by:  Physical exam.  Tests to check that your symptoms are not due to  another condition such as:  Strep throat.  Sinusitis.  Pneumonia.  Asthma. TREATMENT  A URI goes away on its own with time. It cannot be cured with medicines, but medicines may be prescribed or recommended to relieve symptoms. Medicines may help:  Reduce your fever.  Reduce your cough.  Relieve nasal congestion. HOME CARE INSTRUCTIONS   Take medicines only as directed by your health care provider.   Gargle warm saltwater or take cough drops to comfort your throat as directed by your health care provider.  Use a warm mist humidifier or inhale steam from a shower to increase air moisture. This may make it easier to breathe.  Drink enough fluid to keep your urine clear or pale yellow.   Eat soups and other clear broths and maintain good nutrition.   Rest as needed.   Return to work when your temperature has returned to normal or as your health care provider advises. You may need to stay home longer to avoid infecting others. You can also use a face mask and careful hand washing to prevent spread of the virus.  Increase the usage of your inhaler if you have asthma.   Do not use any tobacco products, including cigarettes, chewing tobacco, or electronic cigarettes. If you need help quitting, ask your health care provider. PREVENTION  The best way to protect yourself from getting a cold is to practice good hygiene.   Avoid oral or hand contact with people with cold   symptoms.   Wash your hands often if contact occurs.  There is no clear evidence that vitamin C, vitamin E, echinacea, or exercise reduces the chance of developing a cold. However, it is always recommended to get plenty of rest, exercise, and practice good nutrition.  SEEK MEDICAL CARE IF:   You are getting worse rather than better.   Your symptoms are not controlled by medicine.   You have chills.  You have worsening shortness of breath.  You have brown or red mucus.  You have yellow or brown nasal  discharge.  You have pain in your face, especially when you bend forward.  You have a fever.  You have swollen neck glands.  You have pain while swallowing.  You have white areas in the back of your throat. SEEK IMMEDIATE MEDICAL CARE IF:   You have severe or persistent:  Headache.  Ear pain.  Sinus pain.  Chest pain.  You have chronic lung disease and any of the following:  Wheezing.  Prolonged cough.  Coughing up blood.  A change in your usual mucus.  You have a stiff neck.  You have changes in your:  Vision.  Hearing.  Thinking.  Mood. MAKE SURE YOU:   Understand these instructions.  Will watch your condition.  Will get help right away if you are not doing well or get worse.   This information is not intended to replace advice given to you by your health care provider. Make sure you discuss any questions you have with your health care provider.   Document Released: 06/01/2001 Document Revised: 04/22/2015 Document Reviewed: 03/13/2014 Elsevier Interactive Patient Education 2016 [employer]  

## 2015-10-24 NOTE — ED Provider Notes (Signed)
CSN: [id number]     Arrival date & time 10/24/15  1609 History  By signing my name below, I, Elon SpannerGarrett Cook, attest that this documentation has been prepared under the direction and in the presence of Fayrene HelperBowie Emmaly Leech, PA-C. Electronically Signed: Elon SpannerGarrett Cook ED Scribe. 10/24/2015. 4:40 PM.     Chief Complaint  Patient presents with  . URI  . Nasal Congestion  . Cough   Patient is a 29 y.o. female presenting with cough. The history is provided by the patient. No language interpreter was used.  Cough Associated symptoms: chills, headaches and rhinorrhea   Associated symptoms: no ear pain, no fever, no rash and no sore throat    HPI Comments: Anna GreavesMonique Mchaffie is a 29 y.o. female who presents to the Emergency Department complaining of a persistent, worsened cough productive of green sputum onset 1 week ago; unrelieved with Tylenol/Alka Seltzer cold and flu.  Associated symptoms include clear rhinorrhea, congestion, headache, and sneezing.  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  Patient is not a current smoker.   She denies sore throat, ear pain, fever, n/v/d, rash, appetite changes.      Past Medical History  Diagnosis Date  . Trichomonal vaginitis   . Vaginal yeast infection   . Obesity    History reviewed. No pertinent past surgical history. History reviewed. No pertinent family history. Social History  Substance Use Topics  . Smoking status: Never Smoker   . Smokeless tobacco: None  . Alcohol Use: Yes     Comment: socially   OB History    No data available     Review of Systems  Constitutional: Positive for chills. Negative for fever and appetite change.  HENT: Positive for congestion, rhinorrhea and sneezing. Negative for ear pain and sore throat.   Respiratory: Positive for cough.   Gastrointestinal: Negative for nausea, vomiting and diarrhea.  Skin: Negative for rash.  Neurological: Positive for headaches.      Allergies  Review of  patient's allergies indicates no known allergies.  Home Medications   Prior to Admission medications   Medication Sig Start Date End Date Taking? Authorizing Provider  acetaminophen (TYLENOL) 500 MG tablet Take 1,000 mg by mouth every 6 (six) hours as needed for mild pain or moderate pain.    Historical Provider, MD  Camphor-Eucalyptus-Menthol (VICKS VAPORUB EX) Place 1 application into both nostrils daily as needed (congestion).    Historical Provider, MD  chlorpheniramine-HYDROcodone (TUSSIONEX PENNKINETIC ER) 10-8 MG/5ML LQCR Take 5 mLs by mouth every 12 (twelve) hours as needed for cough (and pain). 03/09/15   Trixie DredgeEmily West, PA-C  guaiFENesin (ROBITUSSIN) 100 MG/5ML liquid Take 300 mg by mouth 3 (three) times daily as needed for cough or congestion.    Historical Provider, MD  ibuprofen (ADVIL,MOTRIN) 800 MG tablet Take 1 tablet (800 mg total) by mouth every 8 (eight) hours as needed for mild pain or moderate pain. 03/09/15   Trixie DredgeEmily West, PA-C  oxymetazoline (AFRIN NASAL SPRAY) 0.05 % nasal spray Place 1 spray into both nostrils 2 (two) times daily as needed for congestion (nasal congestion). X 3 days only 03/09/15   Trixie DredgeEmily West, PA-C  PRESCRIPTION MEDICATION Take 2 puffs by mouth as needed (breathing). Inhaler    Historical Provider, MD   There were no vitals taken for this visit. Physical Exam  Constitutional: She is oriented to person, place, and time. She appears well-developed and well-nourished. No distress.  HENT:  Head: Normocephalic and atraumatic.  Ear,  nose, and throat are normal.  No significant sinus tenderness.  No lymphadenopathy.   Eyes: Conjunctivae and EOM are normal.  Neck: Neck supple. No tracheal deviation present.  Cardiovascular: Normal rate, regular rhythm and normal heart sounds.  Exam reveals no gallop and no friction rub.   No murmur heard. Pulmonary/Chest: Effort normal and breath sounds normal. No respiratory distress. She has no wheezes. She has no rales.   Musculoskeletal: Normal range of motion.  Neurological: She is alert and oriented to person, place, and time.  Skin: Skin is warm and dry.  Psychiatric: She has a normal mood and affect. Her behavior is normal.  Nursing note and vitals reviewed.   ED Course  Procedures (including critical care time)  DIAGNOSTIC STUDIES: Oxygen Saturation is 100% on RA, normal by my interpretation.    COORDINATION OF CARE:  4:39 PM Discussed suspicion of viral etiology.  Will prescribe anti-tussive.  Return precuations advised.  Patient acknowledges and agrees with plan.     MDM   Final diagnoses:  URI (upper respiratory infection)    BP 133/80 mmHg  Pulse 96  Temp(Src) 99 F (37.2 C) (Oral)  Resp 16  SpO2 100%   I personally performed the services described in this documentation, which was scribed in my presence. The recorded information has been reviewed and is accurate.      Fayrene Helper, PA-C 10/24/15 1651  Fayrene Helper, PA-C 10/24/15 1610  Pricilla Loveless, MD 10/28/15 [phone]

## 2017-01-25 ENCOUNTER — Emergency Department (HOSPITAL_COMMUNITY)
Admission: EM | Admit: 2017-01-25 | Discharge: 2017-01-25 | Disposition: A | Discharge status: Home / Self Care | Payer: Self-pay | Attending: Emergency Medicine | Admitting: Emergency Medicine

## 2017-01-25 ENCOUNTER — Encounter (HOSPITAL_COMMUNITY): Payer: Self-pay | Admitting: Emergency Medicine

## 2017-01-25 DIAGNOSIS — J069 Acute upper respiratory infection, unspecified: Secondary | ICD-10-CM | POA: Insufficient documentation

## 2017-01-25 MED ORDER — PSEUDOEPHEDRINE HCL 30 MG PO TABS: 15.0000 mg | ORAL_TABLET | Freq: Four times a day (QID) | ORAL | 0 refills | Status: DC | PRN

## 2017-01-25 NOTE — Discharge Instructions (Signed)
You appear to have an upper respiratory infection (URI). An upper respiratory tract infection, or cold, is a viral infection of the air passages leading to the lungs. It is contagious and can be spread to others, especially during the first 3 or 4 days. It cannot be cured by antibiotics or other medicines. °RETURN IMMEDIATELY IF you develop shortness of breath, confusion or altered mental status, a new rash, become dizzy, faint, or poorly responsive, or are unable to be cared for at home. ° °

## 2017-01-25 NOTE — ED Provider Notes (Signed)
[facility]-EMERGENCY DEPT Provider Note   CSN: [id number] Arrival date & time: 01/25/17  [id number]   By signing my name below, I, Teofilo PodMatthew P. Jamison, attest that this documentation has been prepared under the direction and in the presence of Arthor CaptainAbigail Marialuiza Car, PA-C. Electronically Signed: Teofilo PodMatthew P. Jamison, ED Scribe. 01/25/2017. 11:36 AM.   History   Chief Complaint Chief Complaint  Patient presents with  . URI    The history is provided by the patient. No language interpreter was used.   HPI Comments:  Anna Morrison is a 31 y.o. female who presents to the Emergency Department complaining of worsening persistent cough and congestion x 1 week. Pt complains of associated rhinorrhea, headache. Pt states that she was having cold symptoms ~1 week ago and her symptoms have been worsening over the past several days. Pt has taken Alka Seltzer Plus with no relief, and was prescribed amoxicillin with no relief. Pt denies fever, photophobia, vomiting, rash.   Past Medical History:  Diagnosis Date  . Obesity   . Trichomonal vaginitis   . Vaginal yeast infection     There are no active problems to display for this patient.   History reviewed. No pertinent surgical history.  OB History    No data available       Home Medications    Prior to Admission medications   Medication Sig Start Date End Date Taking? Authorizing Provider  acetaminophen (TYLENOL) 500 MG tablet Take 1,000 mg by mouth every 6 (six) hours as needed for mild pain or moderate pain.    Historical Provider, MD  Camphor-Eucalyptus-Menthol (VICKS VAPORUB EX) Place 1 application into both nostrils daily as needed (congestion).    Historical Provider, MD  chlorpheniramine-HYDROcodone (TUSSIONEX PENNKINETIC ER) 10-8 MG/5ML LQCR Take 5 mLs by mouth every 12 (twelve) hours as needed for cough (and pain). 03/09/15   Trixie DredgeEmily West, PA-C  guaiFENesin (ROBITUSSIN) 100 MG/5ML liquid Take 15 mLs (300 mg total) by mouth 3 (three) times daily as  needed for cough or congestion. 10/24/15   Fayrene HelperBowie Tran, PA-C  ibuprofen (ADVIL,MOTRIN) 800 MG tablet Take 1 tablet (800 mg total) by mouth every 8 (eight) hours as needed for mild pain or moderate pain. 03/09/15   Trixie DredgeEmily West, PA-C  oxymetazoline (AFRIN NASAL SPRAY) 0.05 % nasal spray Place 1 spray into both nostrils 2 (two) times daily as needed for congestion (nasal congestion). X 3 days only 03/09/15   Trixie DredgeEmily West, PA-C  PRESCRIPTION MEDICATION Take 2 puffs by mouth as needed (breathing). Inhaler    Historical Provider, MD  promethazine-dextromethorphan (PROMETHAZINE-DM) 6.25-15 MG/5ML syrup Take 5 mLs by mouth 4 (four) times daily as needed for cough. 10/24/15   Fayrene HelperBowie Tran, PA-C    Family History History reviewed. No pertinent family history.  Social History Social History  Substance Use Topics  . Smoking status: Never Smoker  . Smokeless tobacco: Not on file  . Alcohol use Yes     Comment: socially     Allergies   Patient has no known allergies.   Review of Systems Review of Systems  Constitutional: Negative for fever.  HENT: Positive for congestion and rhinorrhea.   Eyes: Negative for photophobia.  Respiratory: Positive for cough.   Gastrointestinal: Negative for vomiting.  Skin: Negative for rash.  Neurological: Positive for headaches.     Physical Exam Updated Vital Signs BP 139/78 (BP Location: Left Arm)   Pulse 98   Temp 98.7 F (37.1 C) (Oral)   Resp 16   SpO2 100%  Physical Exam  Constitutional: She appears well-developed and well-nourished. No distress.  HENT:  Head: Normocephalic and atraumatic.  Right Ear: Tympanic membrane normal.  Left Ear: Tympanic membrane normal.  Eyes: Conjunctivae are normal.  Cardiovascular: Normal rate.   Pulmonary/Chest: Effort normal.  Abdominal: She exhibits no distension.  Neurological: She is alert.  Skin: Skin is warm and dry.  Psychiatric: She has a normal mood and affect.  Nursing note and vitals reviewed.    ED  Treatments / Results  DIAGNOSTIC STUDIES:  Oxygen Saturation is 100% on RA, normal by my interpretation.    COORDINATION OF CARE:  11:36 AM Discussed treatment plan with pt at bedside and pt agreed to plan.   Labs (all labs ordered are listed, but only abnormal results are displayed) Labs Reviewed - No data to display  EKG  EKG Interpretation None       Radiology No results found.  Procedures Procedures (including critical care time)  Medications Ordered in ED Medications - No data to display   Initial Impression / Assessment and Plan / ED Course  I have reviewed the triage vital signs and the nursing notes.  Pertinent labs & imaging results that were available during my care of the patient were reviewed by me and considered in my medical decision making (see chart for details).     Patients symptoms are consistent with URI, likely viral etiology. Discussed that antibiotics are not indicated for viral infections. Pt will be discharged with symptomatic treatment.  Verbalizes understanding and is agreeable with plan. Pt is hemodynamically stable & in NAD prior to dc.   Final Clinical Impressions(s) / ED Diagnoses   Final diagnoses:  None    New Prescriptions New Prescriptions   No medications on file  I personally performed the services described in this documentation, which was scribed in my presence. The recorded information has been reviewed and is accurate.       Arthor Captain, PA-C 01/25/17 1153    Lyndal Pulley, MD 01/25/17 [phone]

## 2017-01-25 NOTE — ED Triage Notes (Signed)
Pt sts cough and congestion x 1 week; pt denies fever

## 2017-08-26 ENCOUNTER — Emergency Department (HOSPITAL_COMMUNITY): Payer: Self-pay

## 2017-08-26 ENCOUNTER — Encounter (HOSPITAL_COMMUNITY): Payer: Self-pay | Admitting: *Deleted

## 2017-08-26 ENCOUNTER — Emergency Department (HOSPITAL_COMMUNITY)
Admission: EM | Admit: 2017-08-26 | Discharge: 2017-08-26 | Disposition: A | Discharge status: Home / Self Care | Payer: Self-pay | Attending: Emergency Medicine | Admitting: Emergency Medicine

## 2017-08-26 DIAGNOSIS — Z79899 Other long term (current) drug therapy: Secondary | ICD-10-CM | POA: Insufficient documentation

## 2017-08-26 DIAGNOSIS — R0789 Other chest pain: Secondary | ICD-10-CM | POA: Insufficient documentation

## 2017-08-26 LAB — CBC
HCT: 32.2 % — ABNORMAL LOW (ref 36.0–46.0)
Hemoglobin: 8.7 g/dL — ABNORMAL LOW (ref 12.0–15.0)
MCH: 16.9 pg — ABNORMAL LOW (ref 26.0–34.0)
MCHC: 27 g/dL — ABNORMAL LOW (ref 30.0–36.0)
MCV: 62.5 fL — ABNORMAL LOW (ref 78.0–100.0)
PLATELETS: 481 10*3/uL — AB (ref 150–400)
RBC: 5.15 MIL/uL — AB (ref 3.87–5.11)
RDW: 18.7 % — AB (ref 11.5–15.5)
WBC: 9.6 10*3/uL (ref 4.0–10.5)

## 2017-08-26 LAB — BASIC METABOLIC PANEL
Anion gap: 7 (ref 5–15)
BUN: 9 mg/dL (ref 6–20)
CO2: 24 mmol/L (ref 22–32)
CREATININE: 0.7 mg/dL (ref 0.44–1.00)
Calcium: 9.4 mg/dL (ref 8.9–10.3)
Chloride: 108 mmol/L (ref 101–111)
Glucose, Bld: 105 mg/dL — ABNORMAL HIGH (ref 65–99)
POTASSIUM: 4.2 mmol/L (ref 3.5–5.1)
SODIUM: 139 mmol/L (ref 135–145)

## 2017-08-26 LAB — I-STAT TROPONIN, ED: Troponin i, poc: 0 ng/mL (ref 0.00–0.08)

## 2017-08-26 LAB — D-DIMER, QUANTITATIVE (NOT AT ARMC): D DIMER QUANT: 0.32 ug{FEU}/mL (ref 0.00–0.50)

## 2017-08-26 MED ORDER — RANITIDINE HCL 150 MG PO TABS: 150.0000 mg | ORAL_TABLET | Freq: Two times a day (BID) | ORAL | 0 refills | Status: DC

## 2017-08-26 MED ORDER — FERROUS SULFATE 325 (65 FE) MG PO TABS: 325.0000 mg | ORAL_TABLET | Freq: Three times a day (TID) | ORAL | 0 refills | Status: DC

## 2017-08-26 NOTE — ED Notes (Signed)
Assessment at 0928 not [phone]

## 2017-08-26 NOTE — ED Triage Notes (Signed)
Pt reports mid chest tightness and heaviness that started last night, mild sob. No acute distress is noted at triage and ekg done. Denies recent cough.

## 2017-08-26 NOTE — ED Provider Notes (Signed)
[facility] Provider Note   CSN: [id number] Arrival date & time: 08/26/17  [id number]     History   Chief Complaint Chief Complaint  Patient presents with  . Chest Pain    HPI Anna Morrison is a 31 y.o. female.  Patient states that she's had some chest tightness also she's had very irregular menses that have been heavy   The history is provided by the patient. No language interpreter was used.  Chest Pain   This is a new problem. The current episode started more than 2 days ago. The problem occurs constantly. The problem has not changed since onset.The pain is associated with breathing. The pain is present in the substernal region. The pain is at a severity of 3/10. The pain is mild. The quality of the pain is described as dull. The pain does not radiate. The symptoms are aggravated by deep breathing. Pertinent negatives include no abdominal pain, no back pain, no cough and no headaches.  Pertinent negatives for past medical history include no seizures.    Past Medical History:  Diagnosis Date  . Obesity   . Trichomonal vaginitis   . Vaginal yeast infection     There are no active problems to display for this patient.   History reviewed. No pertinent surgical history.  OB History    No data available       Home Medications    Prior to Admission medications   Medication Sig Start Date End Date Taking? Authorizing Provider  ferrous sulfate 325 (65 FE) MG tablet Take 1 tablet (325 mg total) by mouth 3 (three) times daily with meals. 08/26/17   Bethann BerkshireZammit, Rhiannan Kievit, MD  guaiFENesin (ROBITUSSIN) 100 MG/5ML liquid Take 15 mLs (300 mg total) by mouth 3 (three) times daily as needed for cough or congestion. Patient not taking: Reported on 08/26/2017 10/24/15   Fayrene Helperran, Bowie, PA-C  ibuprofen (ADVIL,MOTRIN) 800 MG tablet Take 1 tablet (800 mg total) by mouth every 8 (eight) hours as needed for mild pain or moderate pain. Patient not taking: Reported on 08/26/2017 03/09/15   Trixie DredgeWest, Emily,  PA-C  promethazine-dextromethorphan (PROMETHAZINE-DM) 6.25-15 MG/5ML syrup Take 5 mLs by mouth 4 (four) times daily as needed for cough. Patient not taking: Reported on 08/26/2017 10/24/15   Fayrene Helperran, Bowie, PA-C  pseudoephedrine (SUDAFED) 30 MG tablet Take 0.5-1 tablets (15-30 mg total) by mouth every 6 (six) hours as needed for congestion. Patient not taking: Reported on 08/26/2017 01/25/17   Arthor CaptainHarris, Abigail, PA-C  ranitidine (ZANTAC) 150 MG tablet Take 1 tablet (150 mg total) by mouth 2 (two) times daily. 08/26/17   Bethann BerkshireZammit, Vanecia Limpert, MD    Family History History reviewed. No pertinent family history.  Social History Social History  Substance Use Topics  . Smoking status: Never Smoker  . Smokeless tobacco: Not on file  . Alcohol use Yes     Comment: socially     Allergies   Patient has no known allergies.   Review of Systems Review of Systems  Constitutional: Negative for appetite change and fatigue.  HENT: Negative for congestion, ear discharge and sinus pressure.   Eyes: Negative for discharge.  Respiratory: Negative for cough.   Cardiovascular: Positive for chest pain.  Gastrointestinal: Negative for abdominal pain and diarrhea.  Genitourinary: Negative for frequency and hematuria.  Musculoskeletal: Negative for back pain.  Skin: Negative for rash.  Neurological: Negative for seizures and headaches.  Psychiatric/Behavioral: Negative for hallucinations.     Physical Exam Updated Vital Signs BP (!) 130/93 (  BP Location: Left Arm)   Pulse 100   Temp 98.7 F (37.1 C) (Oral)   Resp 18   LMP 08/12/2017   SpO2 100%   Physical Exam  Constitutional: She is oriented to person, place, and time. She appears well-developed.  HENT:  Head: Normocephalic.  Eyes: Conjunctivae and EOM are normal. No scleral icterus.  Neck: Neck supple. No thyromegaly present.  Cardiovascular: Normal rate and regular rhythm.  Exam reveals no gallop and no friction rub.   No murmur heard. Pulmonary/Chest:  No stridor. She has no wheezes. She has no rales. She exhibits no tenderness.  Abdominal: She exhibits no distension. There is no tenderness. There is no rebound.  Musculoskeletal: Normal range of motion. She exhibits no edema.  Lymphadenopathy:    She has no cervical adenopathy.  Neurological: She is oriented to person, place, and time. She exhibits normal muscle tone. Coordination normal.  Skin: No rash noted. No erythema.  Psychiatric: She has a normal mood and affect. Her behavior is normal.     ED Treatments / Results  Labs (all labs ordered are listed, but only abnormal results are displayed) Labs Reviewed  BASIC METABOLIC PANEL - Abnormal; Notable for the following:       Result Value   Glucose, Bld 105 (*)    All other components within normal limits  CBC - Abnormal; Notable for the following:    RBC 5.15 (*)    Hemoglobin 8.7 (*)    HCT 32.2 (*)    MCV 62.5 (*)    MCH 16.9 (*)    MCHC 27.0 (*)    RDW 18.7 (*)    Platelets 481 (*)    All other components within normal limits  D-DIMER, QUANTITATIVE (NOT AT [facility])  I-STAT TROPONIN, ED    EKG  EKG Interpretation  Date/Time:  Friday August 26 2017 08:28:34 EDT Ventricular Rate:  95 PR Interval:  166 QRS Duration: 84 QT Interval:  360 QTC Calculation: 452 R Axis:   18 Text Interpretation:  Normal sinus rhythm Normal ECG Confirmed by Bethann Berkshire [phone]) on 08/26/2017 9:52:58 AM       Radiology Dg Chest 2 View  Result Date: 08/26/2017 CLINICAL DATA:  Chest pain and pressure starting last night but worsening this morning. EXAM: CHEST  2 VIEW COMPARISON:  03/09/2015 FINDINGS: Cardiothoracic index 53% on the PA chest radiograph. No cephalization of blood flow or edema. The lungs appear clear. No pleural effusion. IMPRESSION: Mild cardiomegaly, without edema or other significant findings. Electronically Signed   By: Gaylyn Rong M.D.   On: 08/26/2017 09:08    Procedures Procedures (including critical care  time)  Medications Ordered in ED Medications - No data to display   Initial Impression / Assessment and Plan / ED Course  I have reviewed the triage vital signs and the nursing notes.  Pertinent labs & imaging results that were available during my care of the patient were reviewed by me and considered in my medical decision making (see chart for details).     Patient's troponin and d-dimer are unremarkable. Patient is anemic. Suspect chest discomfort is noncardiac. I will put her on some Zantac for possible GERD. Patient is anemic from her heavy menses and she will be referred to GYN clinic along with placed on iron  Final Clinical Impressions(s) / ED Diagnoses   Final diagnoses:  Atypical chest pain    New Prescriptions New Prescriptions   FERROUS SULFATE 325 (65 FE) MG TABLET  Take 1 tablet (325 mg total) by mouth 3 (three) times daily with meals.   RANITIDINE (ZANTAC) 150 MG TABLET    Take 1 tablet (150 mg total) by mouth 2 (two) times daily.     Bethann Berkshire, MD 08/26/17 1118

## 2017-08-26 NOTE — Discharge Instructions (Signed)
[facility]

## 2017-11-07 ENCOUNTER — Encounter (HOSPITAL_COMMUNITY): Payer: Self-pay

## 2017-11-07 ENCOUNTER — Emergency Department (HOSPITAL_COMMUNITY): Payer: Self-pay

## 2017-11-07 ENCOUNTER — Other Ambulatory Visit: Payer: Self-pay

## 2017-11-07 ENCOUNTER — Emergency Department (HOSPITAL_COMMUNITY)
Admission: EM | Admit: 2017-11-07 | Discharge: 2017-11-07 | Disposition: A | Discharge status: Home / Self Care | Payer: Self-pay | Attending: Emergency Medicine | Admitting: Emergency Medicine

## 2017-11-07 DIAGNOSIS — J069 Acute upper respiratory infection, unspecified: Secondary | ICD-10-CM | POA: Insufficient documentation

## 2017-11-07 DIAGNOSIS — Z79899 Other long term (current) drug therapy: Secondary | ICD-10-CM | POA: Insufficient documentation

## 2017-11-07 DIAGNOSIS — R05 Cough: Secondary | ICD-10-CM

## 2017-11-07 DIAGNOSIS — R059 Cough, unspecified: Secondary | ICD-10-CM

## 2017-11-07 MED ORDER — BENZONATATE 100 MG PO CAPS: 100.0000 mg | ORAL_CAPSULE | Freq: Three times a day (TID) | ORAL | 0 refills | Status: DC

## 2017-11-07 NOTE — ED Provider Notes (Signed)
[facility]   CSN: [id number] Arrival date & time: 11/07/17  1645     History   Chief Complaint Chief Complaint  Patient presents with  . Cough    HPI  Anna Morrison is a 31 y.o. Female who presents complaining of 5-6 days of persistent cough. Patient reports last week she had a cold, with nasal congestion, rhinorrhea, sore throat, cough developed at the end of this course, all these symptoms have resolved aside from the cough. Patient reports cough is nonproductive, no hemoptysis, reports she gets into fits of coughing and feels like she can't stop. Patient reports occasional headaches from coughing. She's tried over-the-counter cough syrups with no relief. Patient denies any fevers or chills. Patient reports when she gets into a coughing spell she  sometimes feels short of breath or wheezy which resolves as soon as the coughing stops. Patient reports her chest feels sore from all the coughing, but denies any persistent chest pain or pleuritic chest pain. Patient denies any history of asthma, does not smoke.      Past Medical History:  Diagnosis Date  . Obesity   . Trichomonal vaginitis   . Vaginal yeast infection     There are no active problems to display for this patient.   History reviewed. No pertinent surgical history.  OB History    No data available       Home Medications    Prior to Admission medications   Medication Sig Start Date End Date Taking? Authorizing Provider  ferrous sulfate 325 (65 FE) MG tablet Take 1 tablet (325 mg total) by mouth 3 (three) times daily with meals. 08/26/17   Bethann BerkshireZammit, Joseph, MD  guaiFENesin (ROBITUSSIN) 100 MG/5ML liquid Take 15 mLs (300 mg total) by mouth 3 (three) times daily as needed for cough or congestion. Patient not taking: Reported on 08/26/2017 10/24/15   Fayrene Helperran, Bowie, PA-C  ibuprofen (ADVIL,MOTRIN) 800 MG tablet Take 1 tablet (800 mg total) by mouth every 8 (eight)  hours as needed for mild pain or moderate pain. Patient not taking: Reported on 08/26/2017 03/09/15   Trixie DredgeWest, Emily, PA-C  promethazine-dextromethorphan (PROMETHAZINE-DM) 6.25-15 MG/5ML syrup Take 5 mLs by mouth 4 (four) times daily as needed for cough. Patient not taking: Reported on 08/26/2017 10/24/15   Fayrene Helperran, Bowie, PA-C  pseudoephedrine (SUDAFED) 30 MG tablet Take 0.5-1 tablets (15-30 mg total) by mouth every 6 (six) hours as needed for congestion. Patient not taking: Reported on 08/26/2017 01/25/17   Arthor CaptainHarris, Abigail, PA-C  ranitidine (ZANTAC) 150 MG tablet Take 1 tablet (150 mg total) by mouth 2 (two) times daily. 08/26/17   Bethann BerkshireZammit, Joseph, MD    Family History History reviewed. No pertinent family history.  Social History Social History   Tobacco Use  . Smoking status: Never Smoker  . Smokeless tobacco: Never Used  Substance Use Topics  . Alcohol use: Yes    Comment: socially  . Drug use: No     Allergies   Patient has no known allergies.   Review of Systems Review of Systems  Constitutional: Negative for chills and fever.  HENT: Negative for congestion, ear discharge, ear pain, rhinorrhea, sinus pressure, sneezing, sore throat and trouble swallowing.   Eyes: Negative for pain and discharge.  Respiratory: Positive for cough. Negative for chest tightness, shortness of breath and wheezing.   Cardiovascular: Negative for chest pain.  Gastrointestinal: Negative for abdominal pain, diarrhea, nausea and vomiting.  Musculoskeletal: Positive for myalgias. Negative for  arthralgias.  Skin: Negative for rash.  Neurological: Positive for headaches. Negative for facial asymmetry, weakness, light-headedness and numbness.     Physical Exam Updated Vital Signs BP 124/81 (BP Location: Right Arm)   Pulse (!) 108   Temp 98.6 F (37 C) (Oral)   Resp 16   Ht 5\' 1"  (1.549 m)   Wt 134.7 kg (297 lb)   LMP 10/07/2017 (Within Days)   SpO2 100%   BMI 56.12 kg/m   Physical Exam  Constitutional:  She appears well-developed and well-nourished. No distress.  HENT:  Head: Normocephalic and atraumatic.  Mouth/Throat: Oropharynx is clear and moist.  TMs clear with good landmarks, moderate nasal mucosa edema with minmal clear rhinorrhea, posterior oropharynx clear and moist, with no erythema, edema or exudates  Eyes: Right eye exhibits no discharge. Left eye exhibits no discharge.  Neck: Normal range of motion. Neck supple.  Cardiovascular: Normal rate, regular rhythm and normal heart sounds.  Pulmonary/Chest: Effort normal and breath sounds normal. No stridor. No respiratory distress. She has no wheezes. She has no rales.  Abdominal: Soft. Bowel sounds are normal. She exhibits no distension. There is no tenderness.  Musculoskeletal: She exhibits no edema or deformity.  Lymphadenopathy:    She has no cervical adenopathy.  Neurological: She is alert. Coordination normal.  Skin: Skin is warm and dry. Capillary refill takes less than 2 seconds. She is not diaphoretic.  Psychiatric: She has a normal mood and affect. Her behavior is normal.  Nursing note and vitals reviewed.    ED Treatments / Results  Labs (all labs ordered are listed, but only abnormal results are displayed) Labs Reviewed - No data to display  EKG  EKG Interpretation None       Radiology Dg Chest 2 View  Result Date: 11/07/2017 CLINICAL DATA:  Three weeks of cough and congestion. EXAM: CHEST  2 VIEW COMPARISON:  08/26/2017 chest radiograph FINDINGS: Stable cardiomegaly. Clear lungs. No pleural effusion or pneumothorax. Bones are unremarkable. IMPRESSION: Stable cardiomegaly.  No acute pulmonary process identified. Electronically Signed   By: Mitzi Hansen M.D.   On: 11/07/2017 19:02    Procedures Procedures (including critical care time)  Medications Ordered in ED Medications - No data to display   Initial Impression / Assessment and Plan / ED Course  I have reviewed the triage vital signs and  the nursing notes.  Pertinent labs & imaging results that were available during my care of the patient were reviewed by me and considered in my medical decision making (see chart for details).  Patient presents complaining of persistent cough. Had URI symptoms last week which have resolved, but cough persists. Discussed with patient that cough very commonly lingers after upper respiratory infection. Patient reports some chest soreness, denies shortness of breath or pleuritic chest pain. Chest x-ray negative for pneumonia. Patient is well-appearing with normal vitals in no acute distress. Discussed symptomatic treatment of cough, we'll prescribe Tessalon Perles, patient did have some evidence of persistent nasal congestion, suggested Zyrtec and Flonase. Counseled patient on keeping throat date with water, warm teas or throat lozenges to help prevent coughing fits. Patient to follow-up with her primary doctor if symptoms are not improving. Return precautions discussed, patient expresses understanding and is in agreement with plan.   Final Clinical Impressions(s) / ED Diagnoses   Final diagnoses:  Cough  Viral upper respiratory tract infection    ED Discharge Orders        Ordered    benzonatate (TESSALON) 100  MG capsule  Every 8 hours     11/07/17 2122       Dartha LodgeFord, Maeson Purohit N, PA-C 11/08/17 [id number]    Rees, Elizabeth, MD 11/08/17 [id number]

## 2017-11-07 NOTE — Discharge Instructions (Signed)
It is not uncommon for cough to linger after a upper respiratory infection. Please use Tessalon Perles for cough, trying keep throat blades with warm teas with honey, throat lozenges and water. I recommend using Flonase and Zyrtec to help with nasal swelling and drainage, which may also contribute cough. Ibuprofen or Tylenol for aches and pains. If symptoms are not improving with this treatment please follow-up with the community health and wellness clinic. If you develop fevers or chills, worsening chest pain, shortness of breath or other concerning symptoms return to the ED.

## 2017-11-07 NOTE — ED Triage Notes (Signed)
Pt states she had URI sx for 2 weeks. She reports nasal congestion and dry cough. She reports pain with coughing. skin warm and dry, no distress noted.

## 2018-01-16 ENCOUNTER — Encounter (HOSPITAL_COMMUNITY): Payer: Self-pay | Admitting: *Deleted

## 2018-01-16 ENCOUNTER — Other Ambulatory Visit: Payer: Self-pay

## 2018-01-16 ENCOUNTER — Emergency Department (HOSPITAL_COMMUNITY): Payer: Self-pay

## 2018-01-16 ENCOUNTER — Emergency Department (HOSPITAL_COMMUNITY)
Admission: EM | Admit: 2018-01-16 | Discharge: 2018-01-16 | Disposition: A | Discharge status: Home / Self Care | Payer: Self-pay | Attending: Emergency Medicine | Admitting: Emergency Medicine

## 2018-01-16 DIAGNOSIS — J3489 Other specified disorders of nose and nasal sinuses: Secondary | ICD-10-CM | POA: Insufficient documentation

## 2018-01-16 DIAGNOSIS — R509 Fever, unspecified: Secondary | ICD-10-CM | POA: Insufficient documentation

## 2018-01-16 DIAGNOSIS — R69 Illness, unspecified: Secondary | ICD-10-CM | POA: Insufficient documentation

## 2018-01-16 DIAGNOSIS — R5383 Other fatigue: Secondary | ICD-10-CM | POA: Insufficient documentation

## 2018-01-16 DIAGNOSIS — J111 Influenza due to unidentified influenza virus with other respiratory manifestations: Secondary | ICD-10-CM

## 2018-01-16 DIAGNOSIS — Z79899 Other long term (current) drug therapy: Secondary | ICD-10-CM | POA: Insufficient documentation

## 2018-01-16 DIAGNOSIS — R0981 Nasal congestion: Secondary | ICD-10-CM | POA: Insufficient documentation

## 2018-01-16 NOTE — ED Triage Notes (Signed)
Pt was here with a fever for 3 days and cough for 2 weeks.  Pt states she gets lightheaded some.  PT reports temperature this am was 102 and highest 103.  She last had Tylenol at 0830

## 2018-01-16 NOTE — ED Provider Notes (Signed)
[facility] Provider Note  CSN: [id number] Arrival date & time: 01/16/18 1210  Chief Complaint(s) Cough and Fever  HPI Anna GreavesMonique Morrison is a 32 y.o. female  The history is provided by the patient.  Influenza  Presenting symptoms: cough, fatigue, fever (103 tmax - 4 days), myalgias and rhinorrhea   Presenting symptoms: no diarrhea, no headaches, no nausea, no shortness of breath, no sore throat and no vomiting   Severity:  Moderate Onset quality:  Gradual Duration:  1 week Progression:  Worsening Chronicity:  New Relieved by:  Nothing Worsened by:  Nothing Associated symptoms: chills and nasal congestion   Associated symptoms: no mental status change   Risk factors: no diabetes problem, no immunocompromised state and no sick contacts     Past Medical History Past Medical History:  Diagnosis Date  . Obesity   . Trichomonal vaginitis   . Vaginal yeast infection    There are no active problems to display for this patient.  Home Medication(s) Prior to Admission medications   Medication Sig Start Date End Date Taking? Authorizing Provider  benzonatate (TESSALON) 100 MG capsule Take 1 capsule (100 mg total) every 8 (eight) hours by mouth. 11/07/17   Dartha LodgeFord, Kelsey N, PA-C  ferrous sulfate 325 (65 FE) MG tablet Take 1 tablet (325 mg total) by mouth 3 (three) times daily with meals. 08/26/17   Bethann BerkshireZammit, Joseph, MD  guaiFENesin (ROBITUSSIN) 100 MG/5ML liquid Take 15 mLs (300 mg total) by mouth 3 (three) times daily as needed for cough or congestion. Patient not taking: Reported on 08/26/2017 10/24/15   Fayrene Helperran, Bowie, PA-C  ibuprofen (ADVIL,MOTRIN) 800 MG tablet Take 1 tablet (800 mg total) by mouth every 8 (eight) hours as needed for mild pain or moderate pain. Patient not taking: Reported on 08/26/2017 03/09/15   Trixie DredgeWest, Emily, PA-C  promethazine-dextromethorphan (PROMETHAZINE-DM) 6.25-15 MG/5ML syrup Take 5 mLs by mouth 4 (four) times daily as needed for  cough. Patient not taking: Reported on 08/26/2017 10/24/15   Fayrene Helperran, Bowie, PA-C  pseudoephedrine (SUDAFED) 30 MG tablet Take 0.5-1 tablets (15-30 mg total) by mouth every 6 (six) hours as needed for congestion. Patient not taking: Reported on 08/26/2017 01/25/17   Arthor CaptainHarris, Abigail, PA-C  ranitidine (ZANTAC) 150 MG tablet Take 1 tablet (150 mg total) by mouth 2 (two) times daily. 08/26/17   Bethann BerkshireZammit, Joseph, MD                                                                                                                                    Past Surgical History History reviewed. No pertinent surgical history. Family History No family history on file.  Social History Social History   Tobacco Use  . Smoking status: Never Smoker  . Smokeless tobacco: Never Used  Substance Use Topics  . Alcohol use: Yes    Comment: socially  . Drug use: No   Allergies Patient has no known  allergies.  Review of Systems Review of Systems  Constitutional: Positive for chills, fatigue and fever (103 tmax - 4 days).  HENT: Positive for congestion and rhinorrhea. Negative for sore throat.   Respiratory: Positive for cough. Negative for shortness of breath.   Gastrointestinal: Negative for diarrhea, nausea and vomiting.  Musculoskeletal: Positive for myalgias.  Neurological: Negative for headaches.   All other systems are reviewed and are negative for acute change except as noted in the HPI  Physical Exam Vital Signs  I have reviewed the triage vital signs BP (!) 129/56 (BP Location: Right Arm)   Pulse 94   Temp 98.9 F (37.2 C) (Oral)   Resp 18   Ht 5\' 1"  (1.549 m)   Wt 131.5 kg (290 lb)   LMP 12/29/2017   SpO2 100%   BMI 54.80 kg/m   Physical Exam  Constitutional: She is oriented to person, place, and time. She appears well-developed and well-nourished. No distress.  HENT:  Head: Normocephalic and atraumatic.  Right Ear: Tympanic membrane normal.  Left Ear: Tympanic membrane normal.  Nose: Mucosal  edema present.  Mouth/Throat: Posterior oropharyngeal erythema (mild with PND and cobblestoning) present. No tonsillar exudate.  Eyes: Conjunctivae and EOM are normal. Pupils are equal, round, and reactive to light. Right eye exhibits no discharge. Left eye exhibits no discharge. No scleral icterus.  Neck: Normal range of motion. Neck supple.  Cardiovascular: Normal rate and regular rhythm. Exam reveals no gallop and no friction rub.  No murmur heard. Pulmonary/Chest: Effort normal and breath sounds normal. No stridor. No respiratory distress. She has no wheezes. She has no rhonchi. She has no rales.  Abdominal: Soft. She exhibits no distension. There is no tenderness.  Musculoskeletal: She exhibits no edema or tenderness.  Neurological: She is alert and oriented to person, place, and time.  Skin: Skin is warm and dry. No rash noted. She is not diaphoretic. No erythema.  Psychiatric: She has a normal mood and affect.  Vitals reviewed.   ED Results and Treatments Labs (all labs ordered are listed, but only abnormal results are displayed) Labs Reviewed - No data to display                                                                                                                       EKG  EKG Interpretation  Date/Time:    Ventricular Rate:    PR Interval:    QRS Duration:   QT Interval:    QTC Calculation:   R Axis:     Text Interpretation:        Radiology Dg Chest 2 View  Result Date: 01/16/2018 CLINICAL DATA:  Fever for 2 days.  Productive cough. EXAM: CHEST  2 VIEW COMPARISON:  None. FINDINGS: The heart size and mediastinal contours are within normal limits. Both lungs are clear. The visualized skeletal structures are unremarkable. IMPRESSION: No active cardiopulmonary disease. Electronically Signed   By: Elige Ko   On: 01/16/2018 13:40  Pertinent labs & imaging results that were available during my care of the patient were reviewed by me and considered in my medical  decision making (see chart for details).  Medications Ordered in ED Medications - No data to display                                                                                                                                  Procedures Procedures  (including critical care time)  Medical Decision Making / ED Course I have reviewed the nursing notes for this encounter and the patient's prior records (if available in EHR or on provided paperwork).    32 y.o. female presents with flu-like symptoms for 1 week. adequate oral hydration. Rest of history as above.  Patient appears well. No signs of toxicity, patient is interactive. No hypoxia, tachypnea or other signs of respiratory distress. No sign of clinical dehydration. Lung exam clear. Rest of exam as above.  CXR w/o PNA.  Most consistent with flu-like illness   No evidence suggestive of pharyngitis, AOM, PNA, or meningitis.   Discussed symptomatic treatment with the patient and they will follow closely with their PCP.    Final Clinical Impression(s) / ED Diagnoses Final diagnoses:  Influenza-like illness    Disposition: Discharge  Condition: Good  I have discussed the results, Dx and Tx plan with the patient who expressed understanding and agree(s) with the plan. Discharge instructions discussed at great length. The patient was given strict return precautions who verbalized understanding of the instructions. No further questions at time of discharge.    ED Discharge Orders    None       Follow Up: Primary care provider        This chart was dictated using voice recognition software.  Despite best efforts to proofread,  errors can occur which can change the documentation meaning.   Nira Conn, MD 01/16/18 [phone]

## 2018-01-16 NOTE — Discharge Instructions (Signed)
If you do not have a primary care physician then it is very important that you develop a relationship with one.  Please contact [facility] at [phone] for a referral to many excellent primary care physicians in the community.   ° °You may take over-the-counter medicine for symptomatic relief, such as Tylenol, Motrin, TheraFlu, Alka seltzer , black elderberry, etc. Please limit acetaminophen (Tylenol) to 4000 mg and Ibuprofen (Motrin, Advil, etc.) to 2400 mg for a 24hr period. Please note that other over-the-counter medicine may contain acetaminophen or ibuprofen as a component of their ingredients.  ° ° °

## 2018-03-31 ENCOUNTER — Encounter (HOSPITAL_COMMUNITY): Payer: Self-pay | Admitting: Emergency Medicine

## 2018-03-31 ENCOUNTER — Emergency Department (HOSPITAL_COMMUNITY)
Admission: EM | Admit: 2018-03-31 | Discharge: 2018-03-31 | Disposition: A | Discharge status: Home / Self Care | Payer: Self-pay | Attending: Emergency Medicine | Admitting: Emergency Medicine

## 2018-03-31 ENCOUNTER — Emergency Department (HOSPITAL_COMMUNITY): Payer: Self-pay

## 2018-03-31 ENCOUNTER — Other Ambulatory Visit: Payer: Self-pay

## 2018-03-31 DIAGNOSIS — J0120 Acute ethmoidal sinusitis, unspecified: Secondary | ICD-10-CM

## 2018-03-31 DIAGNOSIS — D649 Anemia, unspecified: Secondary | ICD-10-CM

## 2018-03-31 DIAGNOSIS — Z79899 Other long term (current) drug therapy: Secondary | ICD-10-CM | POA: Insufficient documentation

## 2018-03-31 LAB — BASIC METABOLIC PANEL
ANION GAP: 9 (ref 5–15)
BUN: 7 mg/dL (ref 6–20)
CALCIUM: 9 mg/dL (ref 8.9–10.3)
CO2: 24 mmol/L (ref 22–32)
Chloride: 104 mmol/L (ref 101–111)
Creatinine, Ser: 0.68 mg/dL (ref 0.44–1.00)
GFR calc Af Amer: >60 mL/min (ref >60)
Glucose, Bld: 86 mg/dL (ref 65–99)
Potassium: 3.8 mmol/L (ref 3.5–5.1)
Sodium: 137 mmol/L (ref 135–145)

## 2018-03-31 LAB — CBC WITH DIFFERENTIAL/PLATELET
BASOS ABS: 0 10*3/uL (ref 0.0–0.1)
Basophils Relative: 0 %
EOS ABS: 0.1 10*3/uL (ref 0.0–0.7)
Eosinophils Relative: 1 %
HCT: 32.7 % — ABNORMAL LOW (ref 36.0–46.0)
HEMOGLOBIN: 8.8 g/dL — AB (ref 12.0–15.0)
LYMPHS PCT: 26 %
Lymphs Abs: 3.3 10*3/uL (ref 0.7–4.0)
MCH: 16.1 pg — ABNORMAL LOW (ref 26.0–34.0)
MCHC: 26.9 g/dL — ABNORMAL LOW (ref 30.0–36.0)
MCV: 60 fL — ABNORMAL LOW (ref 78.0–100.0)
Monocytes Absolute: 0.5 10*3/uL (ref 0.1–1.0)
Monocytes Relative: 4 %
Neutro Abs: 8.6 10*3/uL — ABNORMAL HIGH (ref 1.7–7.7)
Neutrophils Relative %: 69 %
Platelets: 441 10*3/uL — ABNORMAL HIGH (ref 150–400)
RBC: 5.45 MIL/uL — ABNORMAL HIGH (ref 3.87–5.11)
RDW: 19.3 % — ABNORMAL HIGH (ref 11.5–15.5)
WBC: 12.5 10*3/uL — ABNORMAL HIGH (ref 4.0–10.5)

## 2018-03-31 LAB — I-STAT BETA HCG BLOOD, ED (MC, WL, AP ONLY)

## 2018-03-31 MED ORDER — SALINE SPRAY 0.65 % NA SOLN: 1.0000 | NASAL | 0 refills | Status: DC | PRN

## 2018-03-31 MED ORDER — AMOXICILLIN-POT CLAVULANATE 875-125 MG PO TABS: 1.0000 | ORAL_TABLET | Freq: Two times a day (BID) | ORAL | 0 refills | Status: AC

## 2018-03-31 MED ORDER — IRON 28 MG PO TABS: 60.0000 mg | ORAL_TABLET | Freq: Every day | ORAL | 0 refills | Status: DC

## 2018-03-31 MED ORDER — IBUPROFEN 200 MG PO TABS
600.0000 mg | ORAL_TABLET | Freq: Once | ORAL | Status: AC
  Administered 2018-03-31: 600 mg via ORAL
  Filled 2018-03-31: qty 1

## 2018-03-31 MED ORDER — SODIUM CHLORIDE 0.9 % IV BOLUS
1000.0000 mL | Freq: Once | INTRAVENOUS | Status: AC
  Administered 2018-03-31: 1000 mL via INTRAVENOUS

## 2018-03-31 NOTE — ED Provider Notes (Signed)
Patient placed in Quick Look pathway, seen and evaluated   Chief Complaint: Headache  HPI:   32 year old female presents with a headache for the past month.  She states that she has never had issues with headaches in the past.  She states that the headache sometimes starts at the neck and radiates to the back of her head however sometimes it occurs on the side of her head.  Headache improves with ibuprofen but returns.  She reports associated photosensitivity and bilateral ear pain.  She also has had lightheadedness and feels like she is going to pass out.  She denies dizziness, weakness, numbness or tingling.  ROS: +headache, neck pain, lightheadedness, photosensitivity  -fever, vision changes, numbness, tingling, weakness  Physical Exam:   Gen: No distress. Tearful  Neuro: Awake and Alert  Skin: Warm    Focused Exam: Neuro: Sitting in NAD. GCS 15. Speaks in a clear voice. Right eye is twitching. Cranial nerves II through XII grossly intact. 5/5 strength in all extremities. Sensation fully intact.  Bilateral finger-to-nose intact. Ambulatory   Ibuprofen given. CT head ordered. Initiation of care has begun. The patient has been counseled on the process, plan, and necessity for staying for the completion/evaluation, and the remainder of the medical screening examination    Bethel BornGekas, Kelly Marie, PA-C 03/31/18 1638    Benjiman CorePickering, Nathan, MD 03/31/18 2216

## 2018-03-31 NOTE — Discharge Instructions (Addendum)
As discussed, make sure that you take your entire course of antibiotics even if you feel better.  Follow-up with your primary care provider as soon as possible for evaluation of anemia.  Eat plenty of greens and increase your intake of vegetables. Stay well-hydrated.  Return if symptoms worsen or new concerning symptoms in the meantime.

## 2018-03-31 NOTE — ED Provider Notes (Signed)
[facility] EMERGENCY DEPARTMENT Provider Note   CSN: [id number] Arrival date & time: 03/31/18  1614     History   Chief Complaint Chief Complaint  Patient presents with  . Headache  . Dizziness    HPI Anna Morrison is a 32 y.o. female with no significant past medical history presenting with 1 month of intermittent headache and fatigue as well as lightheadedness today and feeling as though she was going to faint.  When asked she reports that she has been told that her hemoglobin was low in the past but is not seeing a PCP or taking any supplements.  She denies any headache at this time.  Her headaches have been tension type starting from the back of her head and squeezing.  Denies any visual disturbances, nausea, vomiting, trauma. She reports chronic ice cravings.  HPI  Past Medical History:  Diagnosis Date  . Obesity   . Trichomonal vaginitis   . Vaginal yeast infection     There are no active problems to display for this patient.   History reviewed. No pertinent surgical history.   OB History   None      Home Medications    Prior to Admission medications   Medication Sig Start Date End Date Taking? Authorizing Provider  ibuprofen (ADVIL,MOTRIN) 800 MG tablet Take 1 tablet (800 mg total) by mouth every 8 (eight) hours as needed for mild pain or moderate pain. Patient taking differently: Take 400 mg by mouth every 8 (eight) hours as needed for mild pain or moderate pain.  03/09/15  Yes West, Emily, PA-C  amoxicillin-clavulanate (AUGMENTIN) 875-125 MG tablet Take 1 tablet by mouth 2 (two) times daily for 7 days. 03/31/18 04/07/18  Mathews Robinsons B, PA-C  ferrous sulfate 28 MG TABS Take 2.1429 tablets (60 mg total) by mouth daily. 03/31/18   Mathews Robinsons B, PA-C  sodium chloride (OCEAN) 0.65 % SOLN nasal spray Place 1 spray into both nostrils as needed for congestion. 03/31/18   Georgiana Shore, PA-C    Family History No family history on  file.  Social History Social History   Tobacco Use  . Smoking status: Never Smoker  . Smokeless tobacco: Never Used  Substance Use Topics  . Alcohol use: Yes    Comment: socially  . Drug use: No     Allergies   Patient has no known allergies.   Review of Systems Review of Systems  Constitutional: Positive for fatigue. Negative for chills, diaphoresis and fever.  HENT: Positive for congestion and sinus pressure. Negative for ear pain, facial swelling, sore throat, tinnitus and trouble swallowing.   Eyes: Negative for photophobia, pain, redness and visual disturbance.  Respiratory: Negative for cough, chest tightness, shortness of breath, wheezing and stridor.   Cardiovascular: Negative for chest pain, palpitations and leg swelling.  Gastrointestinal: Negative for abdominal distention, abdominal pain, nausea and vomiting.  Genitourinary: Negative for difficulty urinating, dysuria, flank pain and hematuria.  Musculoskeletal: Negative for arthralgias, back pain, gait problem, joint swelling, myalgias, neck pain and neck stiffness.  Skin: Negative for color change, pallor and rash.  Neurological: Positive for light-headedness and headaches. Negative for dizziness, tremors, seizures, syncope, facial asymmetry, speech difficulty, weakness and numbness.       Headache resolved after ibuprofen in triage.  Slight lightheadedness but improved since arrival  Psychiatric/Behavioral: Negative for behavioral problems.     Physical Exam Updated Vital Signs BP 105/88 (BP Location: Right Wrist)   Pulse 70   Temp  98.6 F (37 C) (Oral)   Resp 18   LMP 02/16/2018   SpO2 99%   Physical Exam  Constitutional: She is oriented to person, place, and time. She appears well-developed and well-nourished.  Non-toxic appearance. She does not appear ill. No distress.  Afebrile, nontoxic-appearing, sitting comfortably in bed no acute distress.  HENT:  Head: Normocephalic and atraumatic.    Mouth/Throat: Oropharynx is clear and moist.  Eyes: Pupils are equal, round, and reactive to light. Conjunctivae and EOM are normal.  Conjunctiva pale bilaterally  Neck: Normal range of motion. Neck supple. No neck rigidity.  Cardiovascular: Normal rate, regular rhythm and normal heart sounds.  No murmur heard. Pulmonary/Chest: Effort normal and breath sounds normal. No stridor. No respiratory distress. She has no wheezes. She has no rales.  Abdominal: She exhibits no distension.  Musculoskeletal: Normal range of motion. She exhibits no edema.  Neurological: She is alert and oriented to person, place, and time. She has normal strength. She is not disoriented. No cranial nerve deficit or sensory deficit. She displays a negative Romberg sign. Coordination and gait normal. GCS eye subscore is 4. GCS verbal subscore is 5. GCS motor subscore is 6.  Neurologic Exam:  - Mental status: Patient is alert and cooperative. Fluent speech and words are clear. Coherent thought processes and insight is good. Patient is oriented x 4 to person, place, time and event.  - Cranial nerves:  CN III, IV, VI: pupils equally round, reactive to light both direct and conscensual. Full extra-ocular movement. CN V: motor temporalis and masseter strength intact. CN VII : muscles of facial expression intact. CN X :  midline uvula. XI strength of sternocleidomastoid and trapezius muscles 5/5, XII: tongue is midline when protruded. - Motor: No involuntary movements. Muscle tone and bulk normal throughout. Muscle strength is 5/5 in bilateral shoulder abduction, elbow flexion and extension, grip, hip extension, flexion, leg flexion and extension, ankle dorsiflexion and plantar flexion.  - Sensory:  light tough sensation intact in all extremities.  - Cerebellar: rapid alternating movements and point to point movement intact in upper and lower extremities. Normal stance and gait.  Skin: Skin is warm and dry. No rash noted. She is not  diaphoretic. No cyanosis or erythema.  Psychiatric: She has a normal mood and affect.  Nursing note and vitals reviewed.    ED Treatments / Results  Labs (all labs ordered are listed, but only abnormal results are displayed) Labs Reviewed  CBC WITH DIFFERENTIAL/PLATELET - Abnormal; Notable for the following components:      Result Value   WBC 12.5 (*)    RBC 5.45 (*)    Hemoglobin 8.8 (*)    HCT 32.7 (*)    MCV 60.0 (*)    MCH 16.1 (*)    MCHC 26.9 (*)    RDW 19.3 (*)    Platelets 441 (*)    Neutro Abs 8.6 (*)    All other components within normal limits  BASIC METABOLIC PANEL  I-STAT BETA HCG BLOOD, ED ([facility], [facility], [facility] ONLY)   Hemoglobin  Date Value Ref Range Status  03/31/2018 8.8 (L) 12.0 - 15.0 g/dL Final  16/10/960409/06/2017 8.7 (L) 12.0 - 15.0 g/dL Final  54/09/811912/15/2014 14.710.2 (L) 12.0 - 15.0 g/dL Final  82/95/621304/13/2012 08.611.6 (L) 12.0 - 15.0 g/dL Final    EKG None  Radiology Ct Head Wo Contrast  Result Date: 03/31/2018 CLINICAL DATA:  Headache and dizziness EXAM: CT HEAD WITHOUT CONTRAST TECHNIQUE: Contiguous axial images were obtained  from the base of the skull through the vertex without intravenous contrast. COMPARISON:  None. FINDINGS: Brain: The ventricles are normal in size and configuration. 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 There is a cavum septum pellucidum, an anatomic variant. There is no intracranial mass, hemorrhage, extra-axial fluid collection, or midline shift. No focal gray-white compartment lesions are evident. No evident acute infarct. Vascular: No evident hyperdense vessel. There is no appreciable vascular calcification. Skull: Bony calvarium appears intact. Sinuses/Orbits: There is opacification of multiple ethmoid air cells on the right. There is mild mucosal thickening in the left maxillary antrum. There is opacification of a hypoplastic right frontal sinus. Other paranasal sinuses are clear. Orbits appear symmetric bilaterally. Other: Mastoid air cells are clear.  IMPRESSION: Areas of paranasal sinus disease, most notably in the right ethmoid air cell complex. 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 Study otherwise unremarkable. Electronically Signed   By: Bretta Bang III M.D.   On: 03/31/2018 18:09    Procedures Procedures (including critical care time)  Medications Ordered in ED Medications  ibuprofen (ADVIL,MOTRIN) tablet 600 mg (600 mg Oral Given 03/31/18 1643)  sodium chloride 0.9 % bolus 1,000 mL (0 mLs Intravenous Stopped 03/31/18 2152)     Initial Impression / Assessment and Plan / ED Course  I have reviewed the triage vital signs and the nursing notes.  Pertinent labs & imaging results that were available during my care of the patient were reviewed by me and considered in my medical decision making (see chart for details).     Patient presenting with 1 month of intermittent headache, fatigue and lightheadedness.  Headaches resolved after ibuprofen. Reassuring exam, normal neuro.  Hemoglobin 8.8 which was similar at her last visit in 2018. Patient with pica to ice  Orthostatic vital signs negative She was given IV fluids and improved  CT head was ordered at triage and is negative for acute abnormalities.  Evidence of sinusitis. WBC elevated at 12.5. Patient reports nasal congestion and pressure.  On reassessment,  She reports significant improvement and is asymptomatic. Denies headache, dizziness, chest pain, sob.  Will dc home with abx and iron and close follow up with PCP. Patient was provided with resources  For follow up.  Discussed strict return precautions and advised to return to the emergency department if experiencing any new or worsening symptoms. Instructions were understood and patient agreed with discharge plan. Final Clinical Impressions(s) / ED Diagnoses   Final diagnoses:  Anemia, unspecified type  Acute ethmoidal sinusitis, recurrence not specified    ED  Discharge Orders        Ordered    ferrous sulfate 28 MG TABS  Daily     03/31/18 2209    amoxicillin-clavulanate (AUGMENTIN) 875-125 MG tablet  2 times daily     03/31/18 2209    sodium chloride (OCEAN) 0.65 % SOLN nasal spray  As needed     03/31/18 2210       Gregary Cromer 03/31/18 2222    Benjiman Core, MD 04/05/18 1544

## 2018-03-31 NOTE — ED Triage Notes (Signed)
Patient to ED c/o recurrent headaches and lightheadedness x 1 month with photosensitivity and sharp, shooting pain behind her ears. Patient denies N/V or vision changes.

## 2018-04-13 ENCOUNTER — Emergency Department (HOSPITAL_COMMUNITY)
Admission: EM | Admit: 2018-04-13 | Discharge: 2018-04-13 | Disposition: A | Discharge status: Home / Self Care | Payer: Self-pay | Attending: Emergency Medicine | Admitting: Emergency Medicine

## 2018-04-13 ENCOUNTER — Encounter (HOSPITAL_COMMUNITY): Payer: Self-pay | Admitting: *Deleted

## 2018-04-13 ENCOUNTER — Encounter: Payer: Self-pay | Admitting: Gastroenterology

## 2018-04-13 ENCOUNTER — Other Ambulatory Visit: Payer: Self-pay

## 2018-04-13 DIAGNOSIS — R195 Other fecal abnormalities: Secondary | ICD-10-CM | POA: Insufficient documentation

## 2018-04-13 DIAGNOSIS — D509 Iron deficiency anemia, unspecified: Secondary | ICD-10-CM

## 2018-04-13 DIAGNOSIS — D649 Anemia, unspecified: Secondary | ICD-10-CM | POA: Insufficient documentation

## 2018-04-13 DIAGNOSIS — K921 Melena: Secondary | ICD-10-CM

## 2018-04-13 DIAGNOSIS — R1032 Left lower quadrant pain: Secondary | ICD-10-CM | POA: Insufficient documentation

## 2018-04-13 LAB — COMPREHENSIVE METABOLIC PANEL
ALT: 14 U/L (ref 14–54)
ANION GAP: 8 (ref 5–15)
AST: 15 U/L (ref 15–41)
Albumin: 3.4 g/dL — ABNORMAL LOW (ref 3.5–5.0)
Alkaline Phosphatase: 71 U/L (ref 38–126)
BILIRUBIN TOTAL: 0.8 mg/dL (ref 0.3–1.2)
BUN: 6 mg/dL (ref 6–20)
CO2: 23 mmol/L (ref 22–32)
Calcium: 9 mg/dL (ref 8.9–10.3)
Chloride: 107 mmol/L (ref 101–111)
Creatinine, Ser: 0.7 mg/dL (ref 0.44–1.00)
GFR calc non Af Amer: >60 mL/min (ref >60)
Glucose, Bld: 88 mg/dL (ref 65–99)
POTASSIUM: 3.7 mmol/L (ref 3.5–5.1)
Sodium: 138 mmol/L (ref 135–145)
TOTAL PROTEIN: 7.6 g/dL (ref 6.5–8.1)

## 2018-04-13 LAB — CBC
HEMATOCRIT: 33.1 % — AB (ref 36.0–46.0)
HEMOGLOBIN: 8.9 g/dL — AB (ref 12.0–15.0)
MCH: 16.1 pg — ABNORMAL LOW (ref 26.0–34.0)
MCHC: 26.9 g/dL — ABNORMAL LOW (ref 30.0–36.0)
MCV: 60 fL — ABNORMAL LOW (ref 78.0–100.0)
Platelets: 434 10*3/uL — ABNORMAL HIGH (ref 150–400)
RBC: 5.52 MIL/uL — AB (ref 3.87–5.11)
RDW: 19.3 % — AB (ref 11.5–15.5)
WBC: 10.5 10*3/uL (ref 4.0–10.5)

## 2018-04-13 LAB — URINALYSIS, ROUTINE W REFLEX MICROSCOPIC
Bilirubin Urine: NEGATIVE
GLUCOSE, UA: NEGATIVE mg/dL
HGB URINE DIPSTICK: NEGATIVE
Ketones, ur: NEGATIVE mg/dL
Leukocytes, UA: NEGATIVE
Nitrite: NEGATIVE
PH: 7 (ref 5.0–8.0)
PROTEIN: NEGATIVE mg/dL
Specific Gravity, Urine: 1.012 (ref 1.005–1.030)

## 2018-04-13 LAB — I-STAT BETA HCG BLOOD, ED (MC, WL, AP ONLY): I-stat hCG, quantitative: <5 m[IU]/mL (ref <5)

## 2018-04-13 LAB — LIPASE, BLOOD: Lipase: 31 U/L (ref 11–51)

## 2018-04-13 LAB — POC OCCULT BLOOD, ED: Fecal Occult Bld: POSITIVE — AB

## 2018-04-13 MED ORDER — DOCUSATE SODIUM 100 MG PO CAPS: 100.0000 mg | ORAL_CAPSULE | Freq: Two times a day (BID) | ORAL | 0 refills | Status: DC

## 2018-04-13 NOTE — ED Triage Notes (Signed)
Pt reports recent constipation and lower abd pain. Last bm was yesterday morning, reports noting blood in stools, mucus in stools and now left side abd pain. Denies n/v.

## 2018-04-13 NOTE — Discharge Instructions (Signed)
. °  Please read and follow all provided instructions.  Your diagnoses today include:  1. Microcytic anemia   2. Left lower quadrant pain   3. Blood in stool     Tests performed today include:  Blood counts and electrolytes - shows low blood counts again, normal blood counts  Blood tests to check liver and kidney function  Blood tests to check pancreas function   Urine test to look for infection and pregnancy (in women)  Vital signs. See below for your results today.   Medications prescribed:   Colace - stool softener  This medication can be found over-the-counter.    Take any prescribed medications only as directed.  Home care instructions:   Follow any educational materials contained in this packet.  Follow-up instructions: Please follow-up with your primary care provider and/or the stomach doctor listed in the next 1 week.    Return instructions:  SEEK IMMEDIATE MEDICAL ATTENTION IF:  The pain does not go away or becomes severe   A temperature above 101F develops   Repeated vomiting occurs (multiple episodes)   The pain becomes localized to portions of the abdomen. The right side could possibly be appendicitis. In an adult, the left lower portion of the abdomen could be colitis or diverticulitis.   Blood is being passed in stools or vomit (bright red or black tarry stools)   You develop chest pain, difficulty breathing, dizziness or fainting, or become confused, poorly responsive, or inconsolable (young children)  If you have any other emergent concerns regarding your health  Additional Information: Abdominal (belly) pain can be caused by many things. Your caregiver performed an examination and possibly ordered blood/urine tests and imaging (CT scan, x-rays, ultrasound). Many cases can be observed and treated at home after initial evaluation in the emergency department. Even though you are being discharged home, abdominal pain can be unpredictable. Therefore, you  need a repeated exam if your pain does not resolve, returns, or worsens. Most patients with abdominal pain don't have to be admitted to the hospital or have surgery, but serious problems like appendicitis and gallbladder attacks can start out as nonspecific pain. Many abdominal conditions cannot be diagnosed in one visit, so follow-up evaluations are very important.  Your vital signs today were: BP 119/77 (BP Location: Right Arm)    Pulse 81    Temp 98.8 F (37.1 C) (Oral)    Resp 16    SpO2 100%  If your blood pressure (bp) was elevated above 135/85 this visit, please have this repeated by your doctor within one month. --------------

## 2018-04-13 NOTE — ED Notes (Signed)
Wait time, Q2hr vitals recheck, process of care team reviewing labs explained to pt, pt states understanding at this time.

## 2018-04-13 NOTE — ED Provider Notes (Signed)
[facility] EMERGENCY DEPARTMENT Provider Note   CSN: [id number] Arrival date & time: 04/13/18  5621     History   Chief Complaint Chief Complaint  Patient presents with  . Abdominal Pain    HPI Anna Morrison is a 32 y.o. female.  Patient with a past surgical history, history of iron deficiency anemia presents the emergency department with complaint of 2 days of generalized abdominal pain, now more in the left lower quadrant.  Patient has also noted constipation during this time and admits to straining with bowel movements.  She started taking MiraLAX which caused her to have runny bowel movements.  This morning she noted an episode with bright red blood in the stool that was a mucus consistency.  She has had no fevers, chest pain, shortness of breath, vomiting.  No vaginal bleeding, easy bruising or bleeding, bleeding from the gums.  Patient did start taking iron supplementation after recent ED visit on 03/31/2018.  Patient reports a history of irregular periods but states that they are not particularly heavy.  She does not know why she has anemia.  No history of hemorrhoids or other abdnormal bleeding.  No other treatments prior to arrival.     Past Medical History:  Diagnosis Date  . Obesity   . Trichomonal vaginitis   . Vaginal yeast infection     There are no active problems to display for this patient.   History reviewed. No pertinent surgical history.   OB History   None      Home Medications    Prior to Admission medications   Medication Sig Start Date End Date Taking? Authorizing Provider  ferrous sulfate 28 MG TABS Take 2.1429 tablets (60 mg total) by mouth daily. 03/31/18   Georgiana Shore, PA-C  ibuprofen (ADVIL,MOTRIN) 800 MG tablet Take 1 tablet (800 mg total) by mouth every 8 (eight) hours as needed for mild pain or moderate pain. Patient taking differently: Take 400 mg by mouth every 8 (eight) hours as needed for mild pain or  moderate pain.  03/09/15   Trixie Dredge, PA-C  sodium chloride (OCEAN) 0.65 % SOLN nasal spray Place 1 spray into both nostrils as needed for congestion. 03/31/18   Georgiana Shore PA-C    Family History History reviewed. No pertinent family history.  Social History Social History   Tobacco Use  . Smoking status: Never Smoker  . Smokeless tobacco: Never Used  Substance Use Topics  . Alcohol use: Yes    Comment: socially  . Drug use: No     Allergies   Patient has no known allergies.   Review of Systems Review of Systems  Constitutional: Negative for fever.  HENT: Negative for rhinorrhea and sore throat.   Eyes: Negative for redness.  Respiratory: Negative for cough.   Cardiovascular: Negative for chest pain.  Gastrointestinal: Positive for abdominal pain, blood in stool and constipation. Negative for diarrhea, nausea and vomiting.  Genitourinary: Negative for dysuria.  Musculoskeletal: Negative for myalgias.  Skin: Negative for rash.  Neurological: Negative for headaches.     Physical Exam Updated Vital Signs BP 119/77 (BP Location: Right Arm)   Pulse 81   Temp 98.8 F (37.1 C) (Oral)   Resp 16   SpO2 100%   Physical Exam  Constitutional: She appears well-developed and well-nourished.  HENT:  Head: Normocephalic and atraumatic.  Eyes: Conjunctivae are normal. Right eye exhibits no discharge. Left eye exhibits no discharge.  Neck: Normal range of motion.  Neck supple.  Cardiovascular: Normal rate, regular rhythm and normal heart sounds.  Pulmonary/Chest: Effort normal and breath sounds normal.  Abdominal: Soft. There is no tenderness. There is no rebound and no guarding.  Genitourinary: Rectum normal. Rectal exam shows no external hemorrhoid, no internal hemorrhoid, no fissure and no tenderness. Pelvic exam was performed with patient supine.  Neurological: She is alert.  Skin: Skin is warm and dry.  Psychiatric: She has a normal mood and affect.  Nursing  note and vitals reviewed.    ED Treatments / Results  Labs (all labs ordered are listed, but only abnormal results are displayed) Labs Reviewed  COMPREHENSIVE METABOLIC PANEL - Abnormal; Notable for the following components:      Result Value   Albumin 3.4 (*)    All other components within normal limits  CBC - Abnormal; Notable for the following components:   RBC 5.52 (*)    Hemoglobin 8.9 (*)    HCT 33.1 (*)    MCV 60.0 (*)    MCH 16.1 (*)    MCHC 26.9 (*)    RDW 19.3 (*)    Platelets 434 (*)    All other components within normal limits  LIPASE, BLOOD  URINALYSIS, ROUTINE W REFLEX MICROSCOPIC  I-STAT BETA HCG BLOOD, ED ([facility], [facility], [facility] ONLY)  POC OCCULT BLOOD, ED    EKG None  Radiology No results found.  Procedures Procedures (including critical care time)  Medications Ordered in ED Medications - No data to display   Initial Impression / Assessment and Plan / ED Course  I have reviewed the triage vital signs and the nursing notes.  Pertinent labs & imaging results that were available during my care of the patient were reviewed by me and considered in my medical decision making (see chart for details).     Patient seen and examined.  Lab work reviewed.  Significant for microcytic anemia, chronic.  This is not changed from previous visit.  No leukocytosis.  Vital signs reviewed and are as follows: BP 119/77 (BP Location: Right Arm)   Pulse 81   Temp 98.8 F (37.1 C) (Oral)   Resp 16   SpO2 100%   12:50 PM Rectal exam performed with NT chaperone.   Abdominal exam without any pain at time of exam.  Discussed with patient, do not feel need for imaging at this time.  I have encouraged Colace, follow-up with PCP to address anemia, follow-up with GI if bleeding is still continues.  12:52 PM The patient was urged to return to the Emergency Department immediately with worsening of current symptoms, worsening abdominal pain, persistent vomiting, larger amounts of  blood noted in stools, fever, or any other concerns. The patient verbalized understanding.    Final Clinical Impressions(s) / ED Diagnoses   Final diagnoses:  Microcytic anemia  Left lower quadrant pain  Blood in stool   Patient with known microcytic anemia, on iron supplementation.  She has had constipation recently and straining.  Today when she used the restroom she noted some blood in her stool.  Blood is bright red.  She noted this 1 time.  Currently on exam she has no pain.  No fevers.  Normal white count.  Hemoglobin is low, but at her current baseline compared to a couple weeks ago.  No large GI bleed noted on exam.  At this time, will have patient follow-up with PCP.  She is currently on the waiting list for current health and wellness.  I have  given patient GI referral in case bleeding continues to further evaluation.  She denies having heavy menstrual periods.  Unclear etiology of her anemia at this point (vaginal bleeding vs GI most likely).     ED Discharge Orders        Ordered    docusate sodium (COLACE) 100 MG capsule  Every 12 hours     04/13/18 1249       Renne Crigler, PA-C 04/13/18 1254    Linwood Dibbles, MD 04/14/18 1425

## 2018-04-18 ENCOUNTER — Encounter: Payer: Self-pay | Admitting: Gastroenterology

## 2018-04-18 ENCOUNTER — Ambulatory Visit (INDEPENDENT_AMBULATORY_CARE_PROVIDER_SITE_OTHER): Payer: Self-pay | Admitting: Gastroenterology

## 2018-04-18 VITALS — BP 126/74 | HR 68 | Ht 61.0 in | Wt 289.0 lb

## 2018-04-18 DIAGNOSIS — D509 Iron deficiency anemia, unspecified: Secondary | ICD-10-CM

## 2018-04-18 DIAGNOSIS — K625 Hemorrhage of anus and rectum: Secondary | ICD-10-CM

## 2018-04-18 MED ORDER — HEMOCYTE PLUS 106-1 MG PO CAPS: 1.0000 mg | ORAL_CAPSULE | Freq: Every day | ORAL | 5 refills | Status: DC

## 2018-04-18 NOTE — Progress Notes (Signed)
Chief Complaint: Constipation  Referring Provider:  No ref. provider found      ASSESSMENT AND PLAN;   1. Iron def anemia with 1 episode or rectal bleeding. Hb 8.9, MCV 70 (03/2018), with normal BUN/creatinine -Proceed with colonoscopy with a 2-day preparation.  I discussed risks and benefits.  Since her BMI is above 50, she will be scheduled at [facility]. - If negative,  recommend hematology consultation to r/o any hematologic etiology of anemia like thalassemia. - hemocyte plus 1 tab po qd. 2. History of constipation - colace 1 tab po qd.    HPI:   32 year old very pleasant female with iron deficiency anemia and one episode of rectal bleeding which is resolved.  The rectal bleeding was associated with constipation. No nausea, vomiting, heartburn, regurgitation, odynophagia or dysphagia.  No significant diarrhea or constipation.  There is no melena or hematochezia now. No unintentional weight loss. No NSAIDs. Has been told she was anemic at age 57, started on iron. But she stopped on her own. Menstrual cycles - irregular but not heavy, has been on birth control pills, but stopped since she did not like how she felt on birth control pills.   Past Medical History:  Diagnosis Date  . Obesity   . Trichomonal vaginitis   . Vaginal yeast infection     History reviewed. No pertinent surgical history.  History reviewed. No pertinent family history.  Social History   Tobacco Use  . Smoking status: Never Smoker  . Smokeless tobacco: Never Used  Substance Use Topics  . Alcohol use: Yes    Comment: socially  . Drug use: No    Current Outpatient Medications  Medication Sig Dispense Refill  . ferrous sulfate 28 MG TABS Take 2.1429 tablets (60 mg total) by mouth daily. 30 tablet 0  . ranitidine (RANITIDINE 150 MAX STRENGTH) 150 MG tablet Take 150 mg by mouth 2 (two) times daily.     No current facility-administered medications for this visit.     No Known  Allergies  Review of Systems:  Constitutional: Denies fever, chills, diaphoresis, appetite change and fatigue.  HEENT: Denies photophobia, eye pain, redness, hearing loss, ear pain, congestion, sore throat, rhinorrhea, sneezing, mouth sores, neck pain, neck stiffness and tinnitus.   Respiratory: Denies SOB, DOE, cough, chest tightness,  and wheezing.   Cardiovascular: Denies chest pain, palpitations and leg swelling.  Genitourinary: Denies dysuria, urgency, frequency, hematuria, flank pain and difficulty urinating.  Musculoskeletal: Denies myalgias, back pain, joint swelling, arthralgias and gait problem.  Skin: No rash. .  Neurological: Denies dizziness, seizures, syncope, weakness, light-headedness, numbness and headaches.  Hematological: Denies adenopathy. Easy bruising, personal or family bleeding history.  History of anemia. Psychiatric/Behavioral: No anxiety or depression     Physical Exam:    BP 126/74   Pulse 68   Ht  (1.549 m)   Wt 289 lb (131.1 kg)   BMI 54.61 kg/m  Filed Weights   04/18/18 1114  Weight: 289 lb (131.1 kg)   Constitutional:  Well-developed, in no acute distress. Psychiatric: Normal mood and affect. Behavior is normal. HEENT: Pupils normal.  Conjunctivae are normal. No scleral icterus. Neck supple.  Cardiovascular: Normal rate, regular rhythm. No edema Pulmonary/chest: Effort normal and breath sounds normal. No wheezing, rales or rhonchi. Abdominal: Soft, nondistended. Nontender. Bowel sounds active throughout. There are no masses palpable. No hepatomegaly. Rectal: To be performed at the time of colonoscopy Neurological: Alert and oriented to person place and time.  Skin: Skin is warm and dry. No rashes noted.  Data Reviewed: I have personally reviewed following labs and imaging studies  CBC: Recent Labs  Lab 04/13/18 0801  WBC 10.5  HGB 8.9*  HCT 33.1*  MCV 60.0*  PLT 434*   Basic Metabolic Panel: Recent Labs  Lab 04/13/18 0801  NA  138  K 3.7  CL 107  CO2 23  GLUCOSE 88  BUN 6  CREATININE 0.70  CALCIUM 9.0   GFR: Estimated Creatinine Clearance: 131.6 mL/min (by C-G formula based on SCr of 0.7 mg/dL). Liver Function Tests: Recent Labs  Lab 04/13/18 0801  AST 15  ALT 14  ALKPHOS 71  BILITOT 0.8  PROT 7.6  ALBUMIN 3.4*   Recent Labs  Lab 04/13/18 0801  LIPASE 31   CBC Latest Ref Rng & Units 04/13/2018 03/31/2018 08/26/2017  WBC 4.0 - 10.5 K/uL 10.5 12.5(H) 9.6  Hemoglobin 12.0 - 15.0 g/dL 9.6(E) 9.5(M) 8.4(X)  Hematocrit 36.0 - 46.0 % 33.1(L) 32.7(L) 32.2(L)  Platelets 150 - 400 K/uL 434(H) 441(H) 481(H)   CMP Latest Ref Rng & Units 04/13/2018 03/31/2018 08/26/2017  Glucose 65 - 99 mg/dL 88 86 324(M)  BUN 6 - 20 mg/dL Creatinine 0.44 - 1.00 mg/dL 0.10 2.72 5.36  Sodium 135 - 145 mmol/L 138 137 139  Potassium 3.5 - 5.1 mmol/L 3.7 3.8 4.2  Chloride 101 - 111 mmol/L 107 104 108  CO2 22 - 32 mmol/L Calcium 8.9 - 10.3 mg/dL 9.0 9.0 9.4  Total Protein 6.5 - 8.1 g/dL 7.6 - -  Total Bilirubin 0.3 - 1.2 mg/dL 0.8 - -  Alkaline Phos 38 - 126 U/L 71 - -  AST 15 - 41 U/L 15 - -  ALT 14 - 54 U/L 14 - -       Radiology Studies: Ct Head Wo Contrast  Result Date: 03/31/2018 CLINICAL DATA:  Headache and dizziness EXAM: CT HEAD WITHOUT CONTRAST TECHNIQUE: Contiguous axial images were obtained from the base of the skull through the vertex without intravenous contrast. COMPARISON:  None. FINDINGS: Brain: The ventricles are normal in size and configuration. 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 There is a cavum septum pellucidum, an anatomic variant. There is no intracranial mass, hemorrhage, extra-axial fluid collection, or midline shift. No focal gray-white compartment lesions are evident. No evident acute infarct. Vascular: No evident hyperdense vessel. There is no appreciable vascular calcification. Skull: Bony calvarium appears intact. Sinuses/Orbits: There is opacification of multiple  ethmoid air cells on the right. There is mild mucosal thickening in the left maxillary antrum. There is opacification of a hypoplastic right frontal sinus. Other paranasal sinuses are clear. Orbits appear symmetric bilaterally. Other: Mastoid air cells are clear. IMPRESSION: Areas of paranasal sinus disease, most notably in the right ethmoid air cell complex. 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 Study otherwise unremarkable. Electronically Signed   By: Bretta Bang III M.D.   On: 03/31/2018 18:09      Edman Circle, MD   Cc: No ref. provider found

## 2018-04-18 NOTE — Patient Instructions (Addendum)
If you are age 30 or older, your body mass index should be between 23-30. Your Body mass index is 54.61 kg/m. If this is out of the aforementioned range listed, please consider follow up with your Primary Care Provider.  If you are age 87 or younger, your body mass index should be between 19-25. Your Body mass index is 54.61 kg/m. If this is out of the aformentioned range listed, please consider follow up with your Primary Care Provider.   You have been scheduled for a colonoscopy. Please follow written instructions given to you at your visit today.  Please pick up your prep supplies at the pharmacy within the next 1-3 days. If you use inhalers (even only as needed), please bring them with you on the day of your procedure. Your physician has requested that you go to www.startemmi.com and enter the access code given to you at your visit today. This web site gives a general overview about your procedure. However, you should still follow specific instructions given to you by our office regarding your preparation for the procedure.   We have sent the following medications to your pharmacy for you to pick up at your convenience: Hemocyt take one capsule daily.  Please purchase the following medications over the counter and take as directed: Colace  by mouth once daily.  Thank you for choosing [facility], Lynann Bologna, MD

## 2018-05-19 ENCOUNTER — Telehealth: Payer: Self-pay | Admitting: Gastroenterology

## 2018-05-19 MED ORDER — HEMOCYTE PLUS 106-1 MG PO CAPS: 1.0000 mg | ORAL_CAPSULE | Freq: Every day | ORAL | 5 refills | Status: DC

## 2018-05-19 NOTE — Telephone Encounter (Signed)
Pt said that [facility] never received medication that Dr. Chales Abrahams prescribed. She is requesting to send it again.

## 2018-05-22 ENCOUNTER — Emergency Department (HOSPITAL_COMMUNITY): Payer: Self-pay

## 2018-05-22 ENCOUNTER — Other Ambulatory Visit: Payer: Self-pay

## 2018-05-22 ENCOUNTER — Emergency Department (HOSPITAL_COMMUNITY)
Admission: EM | Admit: 2018-05-22 | Discharge: 2018-05-23 | Disposition: A | Discharge status: Home / Self Care | Payer: Self-pay | Attending: Emergency Medicine | Admitting: Emergency Medicine

## 2018-05-22 DIAGNOSIS — R519 Headache, unspecified: Secondary | ICD-10-CM

## 2018-05-22 DIAGNOSIS — R0789 Other chest pain: Secondary | ICD-10-CM | POA: Insufficient documentation

## 2018-05-22 DIAGNOSIS — R51 Headache: Secondary | ICD-10-CM | POA: Insufficient documentation

## 2018-05-22 DIAGNOSIS — R079 Chest pain, unspecified: Secondary | ICD-10-CM

## 2018-05-22 DIAGNOSIS — J0110 Acute frontal sinusitis, unspecified: Secondary | ICD-10-CM | POA: Insufficient documentation

## 2018-05-22 LAB — BASIC METABOLIC PANEL
ANION GAP: 9 (ref 5–15)
BUN: 6 mg/dL (ref 6–20)
CALCIUM: 9 mg/dL (ref 8.9–10.3)
CO2: 26 mmol/L (ref 22–32)
Chloride: 104 mmol/L (ref 101–111)
Creatinine, Ser: 0.78 mg/dL (ref 0.44–1.00)
GFR calc Af Amer: >60 mL/min (ref >60)
Glucose, Bld: 123 mg/dL — ABNORMAL HIGH (ref 65–99)
Potassium: 3.6 mmol/L (ref 3.5–5.1)
Sodium: 139 mmol/L (ref 135–145)

## 2018-05-22 LAB — CBC
HCT: 34 % — ABNORMAL LOW (ref 36.0–46.0)
HEMOGLOBIN: 8.8 g/dL — AB (ref 12.0–15.0)
MCH: 16.4 pg — ABNORMAL LOW (ref 26.0–34.0)
MCHC: 25.9 g/dL — ABNORMAL LOW (ref 30.0–36.0)
MCV: 63.4 fL — ABNORMAL LOW (ref 78.0–100.0)
Platelets: 469 10*3/uL — ABNORMAL HIGH (ref 150–400)
RBC: 5.36 MIL/uL — ABNORMAL HIGH (ref 3.87–5.11)
RDW: 21.6 % — AB (ref 11.5–15.5)
WBC: 12.6 10*3/uL — ABNORMAL HIGH (ref 4.0–10.5)

## 2018-05-22 LAB — I-STAT BETA HCG BLOOD, ED (MC, WL, AP ONLY): I-stat hCG, quantitative: <5 m[IU]/mL (ref <5)

## 2018-05-22 LAB — I-STAT TROPONIN, ED: TROPONIN I, POC: 0 ng/mL (ref 0.00–0.08)

## 2018-05-22 NOTE — ED Triage Notes (Signed)
Patient states that she has been having CP with her migraines. Having a migraine since Friday with intermittent CP. Patient has no neurologist - taking OTC pain medication for HA.

## 2018-05-23 ENCOUNTER — Emergency Department (HOSPITAL_COMMUNITY): Payer: Self-pay

## 2018-05-23 MED ORDER — DIPHENHYDRAMINE HCL 50 MG/ML IJ SOLN
12.5000 mg | Freq: Once | INTRAMUSCULAR | Status: AC
  Administered 2018-05-23: 12.5 mg via INTRAVENOUS
  Filled 2018-05-23: qty 1

## 2018-05-23 MED ORDER — RANITIDINE HCL 150 MG PO TABS: 150.0000 mg | ORAL_TABLET | Freq: Two times a day (BID) | ORAL | 0 refills | Status: DC

## 2018-05-23 MED ORDER — FAMOTIDINE 20 MG PO TABS
20.0000 mg | ORAL_TABLET | Freq: Once | ORAL | Status: AC
  Administered 2018-05-23: 20 mg via ORAL
  Filled 2018-05-23: qty 1

## 2018-05-23 MED ORDER — METOCLOPRAMIDE HCL 5 MG/ML IJ SOLN
10.0000 mg | Freq: Once | INTRAMUSCULAR | Status: AC
  Administered 2018-05-23: 10 mg via INTRAVENOUS
  Filled 2018-05-23: qty 2

## 2018-05-23 MED ORDER — AMOXICILLIN-POT CLAVULANATE 875-125 MG PO TABS: 1.0000 | ORAL_TABLET | Freq: Two times a day (BID) | ORAL | 0 refills | Status: DC

## 2018-05-23 NOTE — ED Notes (Signed)
Patient Alert and oriented to baseline. Stable and ambulatory to baseline. Patient verbalized understanding of the discharge instructions.  Patient belongings were taken by the patient.   

## 2018-05-23 NOTE — Discharge Instructions (Addendum)
1. Medications: Zantac, Augmentin, usual home medications 2. Treatment: rest, drink plenty of fluids,  3. Follow Up: Please followup with your primary doctor in 2-3 days for discussion of your diagnoses and further evaluation after today's visit; if you do not have a primary care doctor use the resource guide provided to find one; Please return to the ER for seeing pain, vision changes, syncope, chest pain that is associated with nausea or sweating or any other concerns

## 2018-05-23 NOTE — ED Notes (Signed)
Patient transported to CT 

## 2018-05-23 NOTE — ED Provider Notes (Signed)
[facility] EMERGENCY DEPARTMENT Provider Note   CSN: [id number] Arrival date & time: 05/22/18  2147     History   Chief Complaint Chief Complaint  Patient presents with  . Chest Pain    HPI Anna Morrison is a 32 y.o. female female with a hx of obesity, trich presents to the Emergency Department complaining of gradual, persistent, progressively worsening migratory headache onset 3 days ago.  Patient reports over the last 3 days, she has taken 2 doses of pain medication.  She reports equate brand body pain reliever but does not know what the active ingredient is.  Patient reports this seems to help some but the headache returns later.  Patient rates her pain a 4/10.  Patient denies vision changes, numbness, weakness, neck pain, neck stiffness, fevers, chills, abdominal pain, nausea, vomiting, diarrhea, syncope.  Patient denies a previous diagnosis of migraine headache.  She has never seen a neurologist.  Patient reports she does not usually get headaches.  She denies known trauma or falls.  Additionally, patient complains of centralized chest pain onset approximately 3 days ago as well.  She reports a history of acid reflux for which she has previously taken Zantac and reports this feels similar.  She denies shortness of breath, palpitations, cough, hemoptysis, fever.  She is not taking an oral contraceptive, has no history of lupus, no history of recent fracture or surgeries, no history of DVT, no periods of immobilization, no swelling or leg pain.  No treatments prior to arrival for her chest pain.  Nothing seems to make it better or worse.  The history is provided by the patient and medical records. No language interpreter was used.   The history is provided by the patient and medical records. No language interpreter was used.    Past Medical History:  Diagnosis Date  . Obesity   . Trichomonal vaginitis   . Vaginal yeast infection     There are no active  problems to display for this patient.   No past surgical history on file.   OB History   None      Home Medications    Prior to Admission medications   Medication Sig Start Date End Date Taking? Authorizing Provider  amoxicillin-clavulanate (AUGMENTIN) 875-125 MG tablet Take 1 tablet by mouth 2 (two) times daily. One po bid x 7 days 05/23/18   Anetra Czerwinski, Dahlia Client, PA-C  Fe Fum-FA-B Cmp-C-Zn-Mg-Mn-Cu (HEMOCYTE PLUS) 106-1 MG CAPS Take 1 mg by mouth daily. 05/19/18   Lynann Bologna, MD  ferrous sulfate 28 MG TABS Take 2.1429 tablets (60 mg total) by mouth daily. Patient not taking: Reported on 05/22/2018 03/31/18   Georgiana Shore, PA-C  ranitidine (ZANTAC) 150 MG tablet Take 1 tablet (150 mg total) by mouth 2 (two) times daily. 05/23/18   Jalysa Swopes, Dahlia Client, PA-C    Family History No family history on file.  Social History Social History   Tobacco Use  . Smoking status: Never Smoker  . Smokeless tobacco: Never Used  Substance Use Topics  . Alcohol use: Yes    Comment: socially  . Drug use: No     Allergies   Patient has no known allergies.   Review of Systems Review of Systems  Constitutional: Negative for appetite change, diaphoresis, fatigue, fever and unexpected weight change.  HENT: Negative for mouth sores.   Eyes: Negative for visual disturbance.  Respiratory: Negative for cough, chest tightness, shortness of breath and wheezing.   Cardiovascular: Positive for chest pain.  Gastrointestinal: Negative for abdominal pain, constipation, diarrhea, nausea and vomiting.  Endocrine: Negative for polydipsia, polyphagia and polyuria.  Genitourinary: Negative for dysuria, frequency, hematuria and urgency.  Musculoskeletal: Negative for back pain and neck stiffness.  Skin: Negative for rash.  Allergic/Immunologic: Negative for immunocompromised state.  Neurological: Positive for headaches. Negative for syncope and light-headedness.  Hematological: Does not bruise/bleed  easily.  Psychiatric/Behavioral: Negative for sleep disturbance. The patient is not nervous/anxious.      Physical Exam Updated Vital Signs BP 115/75   Pulse 79   Temp 98.4 F (36.9 C) (Oral)   Resp 19   Ht 5\' 1"  (1.549 m)   Wt 131.1 kg (289 lb)   LMP 04/24/2018   SpO2 100%   BMI 54.61 kg/m   Physical Exam  Constitutional: She is oriented to person, place, and time. She appears well-developed and well-nourished. No distress.  Awake, alert, nontoxic appearance  HENT:  Head: Normocephalic and atraumatic.  Mouth/Throat: Oropharynx is clear and moist. No oropharyngeal exudate.  Eyes: Pupils are equal, round, and reactive to light. Conjunctivae and EOM are normal. No scleral icterus.  No horizontal, vertical or rotational nystagmus  Neck: Normal range of motion. Neck supple.  Full active and passive ROM without pain No midline or paraspinal tenderness No nuchal rigidity or meningeal signs  Cardiovascular: Normal rate, regular rhythm and intact distal pulses.  Pulmonary/Chest: Effort normal and breath sounds normal. No respiratory distress. She has no wheezes. She has no rales.  Equal chest expansion  Abdominal: Soft. Bowel sounds are normal. She exhibits no mass. There is no tenderness. There is no rebound and no guarding.  Musculoskeletal: Normal range of motion. She exhibits no edema.  Lymphadenopathy:    She has no cervical adenopathy.  Neurological: She is alert and oriented to person, place, and time. No cranial nerve deficit. She exhibits normal muscle tone. Coordination normal.  Mental Status:  Alert, oriented, thought content appropriate. Speech fluent without evidence of aphasia. Able to follow 2 step commands without difficulty.  Cranial Nerves:  II:  Peripheral visual fields grossly normal, pupils equal, round, reactive to light III,IV, VI: ptosis not present, extra-ocular motions intact bilaterally  V,VII: smile symmetric, facial light touch sensation equal VIII:  hearing grossly normal bilaterally  IX,X: midline uvula rise  XI: bilateral shoulder shrug equal and strong XII: midline tongue extension  Motor:  5/5 in upper and lower extremities bilaterally including strong and equal grip strength and dorsiflexion/plantar flexion Sensory: Pinprick and light touch normal in all extremities.  Cerebellar: normal finger-to-nose with bilateral upper extremities Gait: normal gait and balance CV: distal pulses palpable throughout   Skin: Skin is warm and dry. No rash noted. She is not diaphoretic.  Psychiatric: She has a normal mood and affect. Her behavior is normal. Judgment and thought content normal.  Nursing note and vitals reviewed.    ED Treatments / Results  Labs (all labs ordered are listed, but only abnormal results are displayed) Labs Reviewed  BASIC METABOLIC PANEL - Abnormal; Notable for the following components:      Result Value   Glucose, Bld 123 (*)    All other components within normal limits  CBC - Abnormal; Notable for the following components:   WBC 12.6 (*)    RBC 5.36 (*)    Hemoglobin 8.8 (*)    HCT 34.0 (*)    MCV 63.4 (*)    MCH 16.4 (*)    MCHC 25.9 (*)    RDW 21.6 (*)  Platelets 469 (*)    All other components within normal limits  I-STAT TROPONIN, ED  I-STAT BETA HCG BLOOD, ED ([facility], [facility], [facility] ONLY)    EKG EKG Interpretation  Date/Time:  Monday May 22 2018 21:55:14 EDT Ventricular Rate:  96 PR Interval:  166 QRS Duration: 84 QT Interval:  362 QTC Calculation: 457 R Axis:   15 Text Interpretation:  Normal sinus rhythm Nonspecific ST and T wave abnormality Abnormal ECG No significant change since last tracing Confirmed by Drema Pry [phone]) on 05/23/2018 6:53:31 AM   Radiology Dg Chest 2 View  Result Date: 05/22/2018 CLINICAL DATA:  Chest pain EXAM: CHEST - 2 VIEW COMPARISON:  01/16/2018 FINDINGS: The heart size and mediastinal contours are within normal limits. Both lungs are clear. The visualized  skeletal structures are unremarkable. IMPRESSION: Clear lungs. Electronically Signed   By: Deatra Robinson M.D.   On: 05/22/2018 23:12   Ct Head Wo Contrast  Result Date: 05/23/2018 CLINICAL DATA:  Acute onset of migraine headache. Intermittent generalized chest pain. EXAM: CT HEAD WITHOUT CONTRAST TECHNIQUE: Contiguous axial images were obtained from the base of the skull through the vertex without intravenous contrast. COMPARISON:  CT of the head performed 03/31/2018 FINDINGS: Brain: No evidence of acute infarction, hemorrhage, hydrocephalus, extra-axial collection or mass lesion/mass effect. A cavum septum pellucidum is noted. The posterior fossa, including the cerebellum, brainstem and fourth ventricle, is within normal limits. The third and lateral ventricles, and basal ganglia are unremarkable in appearance. The cerebral hemispheres are symmetric in appearance, with normal gray-white differentiation. No mass effect or midline shift is seen. Vascular: No hyperdense vessel or unexpected calcification. Skull: There is no evidence of fracture; visualized osseous structures are unremarkable in appearance. Sinuses/Orbits: The orbits are within normal limits. There is opacification of the right ethmoid air cells and of the diminutive right frontal sinus. The remaining paranasal sinuses and mastoid air cells are well-aerated. Other: No significant soft tissue abnormalities are seen. IMPRESSION: 1. No acute intracranial pathology seen on CT. 2. Opacification of the right ethmoid air cells and of the diminutive right frontal sinus. Electronically Signed   By: Roanna Raider M.D.   On: 05/23/2018 06:25    Procedures Procedures (including critical care time)  Medications Ordered in ED Medications  metoCLOPramide (REGLAN) injection 10 mg (10 mg Intravenous Given 05/23/18 0549)  diphenhydrAMINE (BENADRYL) injection 12.5 mg (12.5 mg Intravenous Given 05/23/18 0549)  famotidine (PEPCID) tablet 20 mg (20 mg Oral Given  05/23/18 6045)     Initial Impression / Assessment and Plan / ED Course  I have reviewed the triage vital signs and the nursing notes.  Pertinent labs & imaging results that were available during my care of the patient were reviewed by me and considered in my medical decision making (see chart for details).     Patient presents with multiple complaints.  Patient's headache is nonfocal and migratory.  No changes in vision.  She has a normal neurologic exam here.  No paresthesias or gait disturbance to suggest multiple sclerosis.  Concern for possible pseudotumor cerebri.  Patient has no history of same.  CT scan is without acute abnormality including no intracranial hemorrhage or mass.  I personally evaluated these images.  Of note, patient with some frontal and ethmoid opacification.  Will treat for possible acute sinusitis.  She is to follow-up with neurology for further evaluation of her headaches.  Patient also complaining of chest pain.  She is young and otherwise healthy without family cardiac  history.  Troponin is negative and EKG is nonischemic.  Patient does have a history of acid reflux and does not currently taking any medications.  Suspect this may be the cause of her chest pain.  There is no evidence of acute coronary syndrome.  Patient without tachycardia, shortness of breath or hemoptysis.  Doubt pulmonary embolism.  No evidence of Boerhaave's.  Chest x-ray is without evidence of pneumonia, pneumothorax or pulmonary edema.  No swelling or calf tenderness to the lower extremities.  Discussed reasons to return immediately to the emergency department.  Patient states understanding and is in agreement with the plan.  Final Clinical Impressions(s) / ED Diagnoses   Final diagnoses:  Central chest pain  Generalized headache  Acute frontal sinusitis, recurrence not specified    ED Discharge Orders        Ordered    ranitidine (ZANTAC) 150 MG tablet  2 times daily     05/23/18 0643     amoxicillin-clavulanate (AUGMENTIN) 875-125 MG tablet  2 times daily     05/23/18 0643       Raiyah Speakman, Dahlia ClientHannah, PA-C 05/23/18 0657    Cardama, Amadeo GarnetPedro Eduardo, MD 05/24/18 [id number]

## 2018-05-26 NOTE — Telephone Encounter (Signed)
Patient states she has a procedure at the hosp this Tuesday 6.11.19 and her clenpiq prep is still not at the pharmacy. Pt wanting to know how to get prep.

## 2018-05-29 ENCOUNTER — Telehealth: Payer: Self-pay | Admitting: Internal Medicine

## 2018-05-29 ENCOUNTER — Encounter (HOSPITAL_COMMUNITY): Payer: Self-pay | Admitting: *Deleted

## 2018-05-29 MED ORDER — SOD PICOSULFATE-MAG OX-CIT ACD 10-3.5-12 MG-GM -GM/160ML PO SOLN: 1.0000 | Freq: Once | ORAL | 0 refills | Status: AC

## 2018-05-29 NOTE — Telephone Encounter (Signed)
Pt calls before colonoscopy tomorrow States all local pharmacies out of her Clenpiq Needs instruction for prep.  Buying out of pocket.  Instructions given for MiraLax prep.  Take 2 dulcolax tablets now. Mix 238g Miralax with 64 oz gatorade.  Drink 1/2 solution at instructed time tonight and the second half at instructed time in the morning.  NPO at same time as previously instructed.  Pt repeated instructions to me and they were correct. She thanked me for the call. She has been on clears all day today

## 2018-05-29 NOTE — Progress Notes (Signed)
Talked with patient for preop call regarding colonoscopy. Phone number is incorrect in chart. Correct contact number [phone]

## 2018-05-29 NOTE — Telephone Encounter (Signed)
Sent in prescription to patients pharmacy. Patient does not have a working number.

## 2018-05-29 NOTE — Telephone Encounter (Signed)
Called and left message for patient to come by the office at [facility] for sample of Clenpiq. Patient does not have insurance coverage.

## 2018-05-29 NOTE — H&P (View-Only) (Signed)
Talked with patient for preop call regarding colonoscopy. Phone number is incorrect in chart. Correct contact number is-[phone] 

## 2018-05-30 ENCOUNTER — Ambulatory Visit (HOSPITAL_COMMUNITY)
Admission: RE | Admit: 2018-05-30 | Discharge: 2018-05-30 | Disposition: A | Discharge status: Home / Self Care | Payer: Self-pay | Source: Ambulatory Visit | Attending: Gastroenterology | Admitting: Gastroenterology

## 2018-05-30 ENCOUNTER — Ambulatory Visit (HOSPITAL_COMMUNITY): Payer: Self-pay | Admitting: Certified Registered"

## 2018-05-30 ENCOUNTER — Encounter (HOSPITAL_COMMUNITY): Payer: Self-pay | Admitting: *Deleted

## 2018-05-30 ENCOUNTER — Other Ambulatory Visit: Payer: Self-pay

## 2018-05-30 ENCOUNTER — Encounter (HOSPITAL_COMMUNITY)
Admission: RE | Disposition: A | Discharge status: Home / Self Care | Payer: Self-pay | Source: Ambulatory Visit | Attending: Gastroenterology

## 2018-05-30 DIAGNOSIS — K640 First degree hemorrhoids: Secondary | ICD-10-CM | POA: Insufficient documentation

## 2018-05-30 DIAGNOSIS — K641 Second degree hemorrhoids: Secondary | ICD-10-CM

## 2018-05-30 DIAGNOSIS — Z6841 Body Mass Index (BMI) 40.0 and over, adult: Secondary | ICD-10-CM | POA: Insufficient documentation

## 2018-05-30 DIAGNOSIS — K625 Hemorrhage of anus and rectum: Secondary | ICD-10-CM | POA: Insufficient documentation

## 2018-05-30 DIAGNOSIS — D509 Iron deficiency anemia, unspecified: Secondary | ICD-10-CM | POA: Insufficient documentation

## 2018-05-30 HISTORY — PX: COLONOSCOPY WITH PROPOFOL: SHX5780

## 2018-05-30 LAB — PREGNANCY, URINE: Preg Test, Ur: NEGATIVE

## 2018-05-30 SURGERY — COLONOSCOPY WITH PROPOFOL
Anesthesia: Monitor Anesthesia Care

## 2018-05-30 MED ORDER — LACTATED RINGERS IV SOLN
INTRAVENOUS | Status: DC
Start: 2018-05-30 — End: 2018-05-30
  Administered 2018-05-30 (×2): via INTRAVENOUS

## 2018-05-30 MED ORDER — DOCUSATE SODIUM 100 MG PO CAPS: 100.0000 mg | ORAL_CAPSULE | Freq: Two times a day (BID) | ORAL | 2 refills | Status: AC

## 2018-05-30 MED ORDER — LIDOCAINE 2% (20 MG/ML) 5 ML SYRINGE
INTRAMUSCULAR | Status: DC | PRN
  Administered 2018-05-30: 60 mg via INTRAVENOUS

## 2018-05-30 MED ORDER — SODIUM CHLORIDE 0.9 % IV SOLN: INTRAVENOUS | Status: DC

## 2018-05-30 MED ORDER — PROPOFOL 10 MG/ML IV BOLUS
INTRAVENOUS | Status: DC | PRN
  Administered 2018-05-30 (×9): 20 mg via INTRAVENOUS
  Administered 2018-05-30: 50 mg via INTRAVENOUS
  Administered 2018-05-30: 20 mg via INTRAVENOUS
  Administered 2018-05-30: 50 mg via INTRAVENOUS

## 2018-05-30 MED ORDER — PROPOFOL 10 MG/ML IV BOLUS
INTRAVENOUS | Status: AC
  Filled 2018-05-30: qty 40

## 2018-05-30 SURGICAL SUPPLY — 21 items

## 2018-05-30 NOTE — H&P (Signed)
Chief Complaint: Constipation  Referring Provider:  No ref. provider found      ASSESSMENT AND PLAN;   1. Iron def anemia with 1 episode or rectal bleeding. Hb 8.9, MCV 70 (03/2018), with normal BUN/creatinine -Proceed with colonoscopy with a 2-day preparation.  I discussed risks and benefits.  Since her BMI is above 50, [facility] will be [facility] at [facility]. - If negative,  recommend hematology consultation to r/o any hematologic etiology of anemia like thalassemia. - hemocyte plus 1 tab po qd. 2. History of constipation - colace 1 tab po qd.    HPI:   32 year old very pleasant female with iron deficiency anemia and one episode of rectal bleeding which is resolved.  The rectal bleeding was associated with constipation. No nausea, vomiting, heartburn, regurgitation, odynophagia or dysphagia.  No significant diarrhea or constipation.  There is no melena or hematochezia now. No unintentional weight loss. No NSAIDs. Has been told she was anemic at age 32, started on iron. But she stopped on her own. Menstrual cycles - irregular but not heavy, has been on birth control pills, but stopped since she did not like how she felt on birth control pills.       Past Medical History:  Diagnosis Date  . Obesity   . Trichomonal vaginitis   . Vaginal yeast infection     History reviewed. No pertinent surgical history.  History reviewed. No pertinent family history.  Social History        Tobacco Use  . Smoking status: Never Smoker  . Smokeless tobacco: Never Used  Substance Use Topics  . Alcohol use: Yes    Comment: socially  . Drug use: No          Current Outpatient Medications  Medication Sig Dispense Refill  . ferrous sulfate 28 MG TABS Take 2.1429 tablets (60 mg total) by mouth daily. 30 tablet 0  . ranitidine (RANITIDINE 150 MAX STRENGTH) 150 MG tablet Take 150 mg by mouth 2 (two) times daily.     No current facility-administered medications  for this visit.     No Known Allergies  Review of Systems:  Constitutional: Denies fever, chills, diaphoresis, appetite change and fatigue.  HEENT: Denies photophobia, eye pain, redness, hearing loss, ear pain, congestion, sore throat, rhinorrhea, sneezing, mouth sores, neck pain, neck stiffness and tinnitus.   Respiratory: Denies SOB, DOE, cough, chest tightness,  and wheezing.   Cardiovascular: Denies chest pain, palpitations and leg swelling.  Genitourinary: Denies dysuria, urgency, frequency, hematuria, flank pain and difficulty urinating.  Musculoskeletal: Denies myalgias, back pain, joint swelling, arthralgias and gait problem.  Skin: No rash. .  Neurological: Denies dizziness, seizures, syncope, weakness, light-headedness, numbness and headaches.  Hematological: Denies adenopathy. Easy bruising, personal or family bleeding history.  History of anemia. Psychiatric/Behavioral: No anxiety or depression     Physical Exam:    BP 126/74   Pulse 68   Ht 5\' 1"  (1.549 m)   Wt 289 lb (131.1 kg)   BMI 54.61 kg/m     Filed Weights   04/18/18 1114  Weight: 289 lb (131.1 kg)   Constitutional:  Well-developed, in no acute distress. Psychiatric: Normal mood and affect. Behavior is normal. HEENT: Pupils normal.  Conjunctivae are normal. No scleral icterus. Neck supple.  Cardiovascular: Normal rate, regular rhythm. No edema Pulmonary/chest: Effort normal and breath sounds normal. No wheezing, rales or rhonchi. Abdominal: Soft, nondistended. Nontender. Bowel sounds active throughout. There are no masses palpable. No hepatomegaly. Rectal: To  be performed at the time of colonoscopy Neurological: Alert and oriented to person place and time. Skin: Skin is warm and dry. No rashes noted.  Data Reviewed: I have personally reviewed following labs and imaging studies  CBC: Last Labs      Recent Labs  Lab 04/13/18 0801  WBC 10.5  HGB 8.9*  HCT 33.1*  MCV 60.0*  PLT 434*      Basic Metabolic Panel: Last Labs      Recent Labs  Lab 04/13/18 0801  NA 138  K 3.7  CL 107  CO2 23  GLUCOSE 88  BUN 6  CREATININE 0.70  CALCIUM 9.0     GFR: Estimated Creatinine Clearance: 131.6 mL/min (by C-G formula based on SCr of 0.7 mg/dL). Liver Function Tests: Last Labs      Recent Labs  Lab 04/13/18 0801  AST 15  ALT 14  ALKPHOS 71  BILITOT 0.8  PROT 7.6  ALBUMIN 3.4*     Last Labs      Recent Labs  Lab 04/13/18 0801  LIPASE 31     CBC Latest Ref Rng & Units 04/13/2018 03/31/2018 08/26/2017  WBC 4.0 - 10.5 K/uL 10.5 12.5(H) 9.6  Hemoglobin 12.0 - 15.0 g/dL 0.9(W) 1.1(B) 1.4(N)  Hematocrit 36.0 - 46.0 % 33.1(L) 32.7(L) 32.2(L)  Platelets 150 - 400 K/uL 434(H) 441(H) 481(H)   CMP Latest Ref Rng & Units 04/13/2018 03/31/2018 08/26/2017  Glucose 65 - 99 mg/dL 88 86 829(F)  BUN 6 - 20 mg/dL 6 7 9   Creatinine 0.44 - 1.00 mg/dL 6.21 3.08 6.57  Sodium 135 - 145 mmol/L 138 137 139  Potassium 3.5 - 5.1 mmol/L 3.7 3.8 4.2  Chloride 101 - 111 mmol/L 107 104 108  CO2 22 - 32 mmol/L 23 24 24   Calcium 8.9 - 10.3 mg/dL 9.0 9.0 9.4  Total Protein 6.5 - 8.1 g/dL 7.6 - -  Total Bilirubin 0.3 - 1.2 mg/dL 0.8 - -  Alkaline Phos 38 - 126 U/L 71 - -  AST 15 - 41 U/L 15 - -  ALT 14 - 54 U/L 14 - -       Radiology Studies:  Imaging Results  Ct Head Wo Contrast  Result Date: 03/31/2018 CLINICAL DATA:  Headache and dizziness EXAM: CT HEAD WITHOUT CONTRAST TECHNIQUE: Contiguous axial images were obtained from the base of the skull through the vertex without intravenous contrast. COMPARISON:  None. FINDINGS: Brain: The ventricles are normal in size and configuration. 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 There is a cavum septum pellucidum, an anatomic variant. There is no intracranial mass, hemorrhage, extra-axial fluid collection, or midline shift. No focal gray-white compartment lesions are evident. No evident acute infarct. Vascular: No evident  hyperdense vessel. There is no appreciable vascular calcification. Skull: Bony calvarium appears intact. Sinuses/Orbits: There is opacification of multiple ethmoid air cells on the right. There is mild mucosal thickening in the left maxillary antrum. There is opacification of a hypoplastic right frontal sinus. Other paranasal sinuses are clear. Orbits appear symmetric bilaterally. Other: Mastoid air cells are clear. IMPRESSION: Areas of paranasal sinus disease, most notably in the right ethmoid air cell complex. 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 Study otherwise unremarkable. Electronically Signed   By: Bretta Bang III M.D.   On: 03/31/2018 18:09       Edman Circle, MD    No change this is preoperative for colonoscopy Edman Circle

## 2018-05-30 NOTE — Telephone Encounter (Signed)
Thanks a lot [employer]

## 2018-05-30 NOTE — Anesthesia Procedure Notes (Addendum)
Procedure Name: MAC Date/Time: 05/30/2018 9:44 AM Performed by: Cynda Familia, CRNA Pre-anesthesia Checklist: Patient identified, Emergency Drugs available, Suction available, Patient being monitored and Timeout performed Patient Re-evaluated:Patient Re-evaluated prior to induction Oxygen Delivery Method: Simple face mask Placement Confirmation: positive ETCO2 and breath sounds checked- equal and bilateral Dental Injury: Teeth and Oropharynx as per pre-operative assessment

## 2018-05-30 NOTE — Interval H&P Note (Signed)
History and Physical Interval Note:  05/30/2018 9:37 AM  Anna RungMonique Perlie GoldRussell  has presented today for surgery, with the diagnosis of Iron Def Anemia  The various methods of treatment have been discussed with the patient and family. After consideration of risks, benefits and other options for treatment, the patient has consented to  Procedure(s): COLONOSCOPY WITH PROPOFOL (N/A) as a surgical intervention .  The patient's history has been reviewed, patient examined, no change in status, stable for surgery.  I have reviewed the patient's chart and labs.  Questions were answered to the patient's satisfaction.     Lynann Bolognaajesh Kealan Buchan

## 2018-05-30 NOTE — Discharge Instructions (Signed)
YOU HAD AN ENDOSCOPIC PROCEDURE TODAY: Refer to the procedure report and other information in the discharge instructions given to you for any specific questions about what was found during the examination. If this information does not answer your questions, please call [place] office at [phone] to clarify.  ° °YOU SHOULD EXPECT: Some feelings of bloating in the abdomen. Passage of more gas than usual. Walking can help get rid of the air that was put into your GI tract during the procedure and reduce the bloating. If you had a lower endoscopy (such as a colonoscopy or flexible sigmoidoscopy) you may notice spotting of blood in your stool or on the toilet paper. Some abdominal soreness may be present for a day or two, also. ° °DIET: Your first meal following the procedure should be a light meal and then it is ok to progress to your normal diet. A half-sandwich or bowl of soup is an example of a good first meal. Heavy or fried foods are harder to digest and may make you feel nauseous or bloated. Drink plenty of fluids but you should avoid alcoholic beverages for 24 hours. If you had a esophageal dilation, please see attached instructions for diet.   ° °ACTIVITY: Your care partner should take you home directly after the procedure. You should plan to take it easy, moving slowly for the rest of the day. You can resume normal activity the day after the procedure however YOU SHOULD NOT DRIVE, use power tools, machinery or perform tasks that involve climbing or major physical exertion for 24 hours (because of the sedation medicines used during the test).  ° °SYMPTOMS TO REPORT IMMEDIATELY: °A gastroenterologist can be reached at any hour. Please call [phone]  for any of the following symptoms:  °Following lower endoscopy (colonoscopy, flexible sigmoidoscopy) °Excessive amounts of blood in the stool  °Significant tenderness, worsening of abdominal pains  °Swelling of the abdomen that is new, acute  °Fever of 100° or  higher  °Following upper endoscopy (EGD, EUS, ERCP, esophageal dilation) °Vomiting of blood or coffee ground material  °New, significant abdominal pain  °New, significant chest pain or pain under the shoulder blades  °Painful or persistently difficult swallowing  °New shortness of breath  °Black, tarry-looking or red, bloody stools ° °FOLLOW UP:  °If any biopsies were taken you will be contacted by phone or by letter within the next 1-3 weeks. Call [phone]  if you have not heard about the biopsies in 3 weeks.  °Please also call with any specific questions about appointments or follow up tests. ° °

## 2018-05-30 NOTE — Op Note (Addendum)
[facility] Patient Name: Anna Morrison Procedure Date: 05/30/2018 MRN: [id number] Attending MD: Jackquline Denmark , MD Date of Birth: 07-11-1986 CSN: [id number] Age: 31 Admit Type: Outpatient Procedure:                Colonoscopy Indications:              Rectal bleeding Providers:                Jackquline Denmark, MD, Cleda Daub, RN, William Dalton, Technician Referring MD:              Medicines:                Monitored Anesthesia Care Complications:            No immediate complications. Estimated Blood Loss:     Estimated blood loss: none. Procedure:                Pre-Anesthesia Assessment:                           - Prior to the procedure, a History and Physical                            was performed, and patient medications and                            allergies were reviewed. The patient is competent.                            The risks and benefits of the procedure and the                            sedation options and risks were discussed with the                            patient. All questions were answered and informed                            consent was obtained. Patient identification and                            proposed procedure were verified by the physician                            in the pre-procedure area in the procedure room.                            Mental Status Examination: alert and oriented.                            Prophylactic Antibiotics: The patient does not                            require prophylactic antibiotics. Prior  Anticoagulants: The patient has taken no previous                            anticoagulant or antiplatelet agents. ASA Grade                            Assessment: II - A patient with mild systemic                            disease. After reviewing the risks and benefits,                            the patient was deemed in satisfactory  condition to                            undergo the procedure. The anesthesia plan was to                            use monitored anesthesia care (MAC). Immediately                            prior to administration of medications, the patient                            was re-assessed for adequacy to receive sedatives.                            The heart rate, respiratory rate, oxygen                            saturations, blood pressure, adequacy of pulmonary                            ventilation, and response to care were monitored                            throughout the procedure. The physical status of                            the patient was re-assessed after the procedure.                           After obtaining informed consent, the colonoscope                            was passed under direct vision. Throughout the                            procedure, the patient's blood pressure, pulse, and                            oxygen saturations were monitored continuously. The  EC-[id number] ([id number]) scope was introduced through                            the anus and advanced to the 4 cm into the ileum.                            The colonoscopy was performed without difficulty.                            The patient tolerated the procedure well. The                            quality of the bowel preparation was excellent. Scope In: 9:52:09 AM Scope Out: 10:04:28 AM Scope Withdrawal Time: 0 hours 8 minutes 51 seconds  Total Procedure Duration: 0 hours 12 minutes 19 seconds  Findings:      Non-bleeding internal hemorrhoids were found during retroflexion. The       hemorrhoids were small and Grade I.      The exam was otherwise without abnormality. Impression:               - Non-bleeding small internal hemorrhoids.                           - The examination was otherwise normal to TI. Moderate Sedation:      none Recommendation:           - Patient  has a contact number available for                            emergencies. The signs and symptoms of potential                            delayed complications were discussed with the                            patient. Return to normal activities tomorrow.                            Written discharge instructions were provided to the                            patient.                           - High fiber diet.                           - Continue present medications.                           - Colace capsule(s) orally 100 mg BID.                           - FU in GI clinic in 12 weeks. If she continues to  be anemic, would recommend hematology consultation. Procedure Code(s):        --- Professional ---                           [phone], Colonoscopy, flexible; diagnostic, including                            collection of specimen(s) by brushing or washing,                            when performed (separate procedure) Diagnosis Code(s):        --- Professional ---                           K64.1, Second degree hemorrhoids                           K62.5, Hemorrhage of anus and rectum CPT copyright 2017 [employer]. All rights reserved. The codes documented in this report are preliminary and upon coder review may  be revised to meet current compliance requirements. Jackquline Denmark, MD 05/30/2018 10:10:30 AM This report has been signed electronically. Number of Addenda: 0

## 2018-05-30 NOTE — Anesthesia Preprocedure Evaluation (Addendum)
Anesthesia Evaluation  Patient identified by MRN, date of birth, ID band Patient awake    Reviewed: Allergy & Precautions, NPO status , Patient's Chart, lab work & pertinent test results  Airway Mallampati: I  TM Distance: >3 FB Neck ROM: Full    Dental  (+) Dental Advisory Given   Pulmonary    Pulmonary exam normal        Cardiovascular Normal cardiovascular exam     Neuro/Psych    GI/Hepatic   Endo/Other  Morbid obesity  Renal/GU      Musculoskeletal   Abdominal   Peds  Hematology   Anesthesia Other Findings   Reproductive/Obstetrics                            Anesthesia Physical Anesthesia Plan  ASA: III  Anesthesia Plan: MAC   Post-op Pain Management:    Induction: Intravenous  PONV Risk Score and Plan: 2 and Treatment may vary due to age or medical condition  Airway Management Planned: Simple Face Mask  Additional Equipment:   Intra-op Plan:   Post-operative Plan:   Informed Consent: I have reviewed the patients History and Physical, chart, labs and discussed the procedure including the risks, benefits and alternatives for the proposed anesthesia with the patient or authorized representative who has indicated his/her understanding and acceptance.     Plan Discussed with: CRNA and Surgeon  Anesthesia Plan Comments:         Anesthesia Quick Evaluation

## 2018-05-30 NOTE — Transfer of Care (Signed)
Immediate Anesthesia Transfer of Care Note  Patient: Anna Morrison  Procedure(s) Performed: COLONOSCOPY WITH PROPOFOL (N/A )  Patient Location: PACU and Endoscopy Unit  Anesthesia Type:MAC  Level of Consciousness: awake and alert   Airway & Oxygen Therapy: Patient Spontanous Breathing and Patient connected to face mask oxygen  Post-op Assessment: Report given to RN and Post -op Vital signs reviewed and stable  Post vital signs: Reviewed and stable  Last Vitals:  Vitals Value Taken Time  BP    Temp    Pulse 84 05/30/2018 10:15 AM  Resp 19 05/30/2018 10:15 AM  SpO2 100 % 05/30/2018 10:15 AM  Vitals shown include unvalidated device data.  Last Pain:  Vitals:   05/30/18 0845  TempSrc: Oral  PainSc: 0-No pain         Complications: No apparent anesthesia complications

## 2018-05-30 NOTE — Anesthesia Postprocedure Evaluation (Signed)
Anesthesia Post Note  Patient: Anna Morrison  Procedure(s) Performed: COLONOSCOPY WITH PROPOFOL (N/A )     Patient location during evaluation: PACU Anesthesia Type: MAC Level of consciousness: awake and alert Pain management: pain level controlled Vital Signs Assessment: post-procedure vital signs reviewed and stable Respiratory status: spontaneous breathing, nonlabored ventilation, respiratory function stable and patient connected to nasal cannula oxygen Cardiovascular status: stable and blood pressure returned to baseline Postop Assessment: no apparent nausea or vomiting Anesthetic complications: no    Last Vitals:  Vitals:   05/30/18 1025 05/30/18 1030  BP:    Pulse:  84  Resp:  (!) 22  Temp:    SpO2: 100% 100%    Last Pain:  Vitals:   05/30/18 1030  TempSrc:   PainSc: 0-No pain                 Arwin Bisceglia DAVID

## 2018-06-02 ENCOUNTER — Encounter (HOSPITAL_COMMUNITY): Payer: Self-pay | Admitting: Gastroenterology

## 2018-07-23 ENCOUNTER — Encounter (HOSPITAL_COMMUNITY): Payer: Self-pay | Admitting: Emergency Medicine

## 2018-07-23 ENCOUNTER — Emergency Department (HOSPITAL_COMMUNITY): Payer: Self-pay

## 2018-07-23 ENCOUNTER — Emergency Department (HOSPITAL_COMMUNITY)
Admission: EM | Admit: 2018-07-23 | Discharge: 2018-07-24 | Disposition: A | Discharge status: Home / Self Care | Payer: Self-pay | Attending: Emergency Medicine | Admitting: Emergency Medicine

## 2018-07-23 DIAGNOSIS — R51 Headache: Secondary | ICD-10-CM | POA: Insufficient documentation

## 2018-07-23 DIAGNOSIS — R519 Headache, unspecified: Secondary | ICD-10-CM

## 2018-07-23 DIAGNOSIS — Z79899 Other long term (current) drug therapy: Secondary | ICD-10-CM | POA: Insufficient documentation

## 2018-07-23 DIAGNOSIS — Q018 Encephalocele of other sites: Secondary | ICD-10-CM | POA: Insufficient documentation

## 2018-07-23 LAB — I-STAT CHEM 8, ED
BUN: 9 mg/dL (ref 6–20)
CHLORIDE: 105 mmol/L (ref 98–111)
Calcium, Ion: 1.12 mmol/L — ABNORMAL LOW (ref 1.15–1.40)
Creatinine, Ser: 0.7 mg/dL (ref 0.44–1.00)
Glucose, Bld: 85 mg/dL (ref 70–99)
HCT: 37 % (ref 36.0–46.0)
Hemoglobin: 12.6 g/dL (ref 12.0–15.0)
POTASSIUM: 4.4 mmol/L (ref 3.5–5.1)
SODIUM: 140 mmol/L (ref 135–145)
TCO2: 27 mmol/L (ref 22–32)

## 2018-07-23 LAB — I-STAT BETA HCG BLOOD, ED (MC, WL, AP ONLY)

## 2018-07-23 MED ORDER — KETOROLAC TROMETHAMINE 15 MG/ML IJ SOLN
15.0000 mg | Freq: Once | INTRAMUSCULAR | Status: AC
  Administered 2018-07-23: 15 mg via INTRAVENOUS
  Filled 2018-07-23: qty 1

## 2018-07-23 MED ORDER — PROCHLORPERAZINE EDISYLATE 10 MG/2ML IJ SOLN
10.0000 mg | Freq: Once | INTRAMUSCULAR | Status: AC
  Administered 2018-07-23: 10 mg via INTRAVENOUS
  Filled 2018-07-23: qty 2

## 2018-07-23 MED ORDER — DEXAMETHASONE SODIUM PHOSPHATE 10 MG/ML IJ SOLN
10.0000 mg | Freq: Once | INTRAMUSCULAR | Status: AC
  Administered 2018-07-23: 10 mg via INTRAVENOUS
  Filled 2018-07-23: qty 1

## 2018-07-23 NOTE — ED Provider Notes (Signed)
[facility] EMERGENCY DEPARTMENT Provider Note   CSN: [id number] Arrival date & time: 07/23/18  1831     History   Chief Complaint Chief Complaint  Patient presents with  . Headache  . Spasms    HPI Anna Morrison is a 32 y.o. female with history of obesity who presents to the  ED with complaints of headache for the past 5 days. Patient's pain is located to the L side of her head, it has had fairly gradual onset and waxes/wanes in nature. No specific alleviating/aggravating factors have been identified. She states she has had similar quality headaches, not necessarily same location. She has noted some generalized "muscle spasms", mostly to the LUE and the RLE. No specific triggers or alleviating/aggravating factors. Has tired ibuprofen, aspirin, and zyrtec without relief. Denies fever, chills, change in vision, numbness, weakness, photophobia, dizziness, or syncope. No recent injuries. Denies leg swelling,  recent surgery/trauma, recent long travel, hormone use, personal hx of cancer, or hx of DVT/PE.    HPI  Past Medical History:  Diagnosis Date  . Obesity   . Trichomonal vaginitis   . Vaginal yeast infection     There are no active problems to display for this patient.   Past Surgical History:  Procedure Laterality Date  . COLONOSCOPY WITH PROPOFOL N/A 05/30/2018   Procedure: COLONOSCOPY WITH PROPOFOL;  Surgeon: Lynann Bologna, MD;  Location: [facility] ENDOSCOPY;  Service: Endoscopy;  Laterality: N/A;     OB History   None      Home Medications    Prior to Admission medications   Medication Sig Start Date End Date Taking? Authorizing Provider  docusate sodium (COLACE) 100 MG capsule Take 1 capsule (100 mg total) by mouth 2 (two) times daily. 05/30/18 05/30/19  Lynann Bologna, MD  Fe Fum-FA-B Cmp-C-Zn-Mg-Mn-Cu (HEMOCYTE PLUS) 106-1 MG CAPS Take 1 mg by mouth daily. 05/19/18   Lynann Bologna, MD  ferrous sulfate 28 MG TABS Take 2.1429 tablets (60 mg total) by  mouth daily. Patient not taking: Reported on 05/22/2018 03/31/18   Georgiana Shore, PA-C  ranitidine (ZANTAC) 150 MG tablet Take 1 tablet (150 mg total) by mouth 2 (two) times daily. 05/23/18   Muthersbaugh, Boyd Kerbs    Family History History reviewed. No pertinent family history.  Social History Social History   Tobacco Use  . Smoking status: Never Smoker  . Smokeless tobacco: Never Used  Substance Use Topics  . Alcohol use: Yes    Comment: socially  . Drug use: No     Allergies   Patient has no known allergies.   Review of Systems Review of Systems  Constitutional: Negative for chills and fever.  HENT: Positive for congestion.   Eyes: Negative for visual disturbance.  Respiratory: Negative for shortness of breath.   Cardiovascular: Negative for chest pain.  Musculoskeletal: Positive for myalgias.  Neurological: Positive for headaches. Negative for dizziness, tremors, seizures, syncope, speech difficulty, weakness and numbness.  All other systems reviewed and are negative.   Physical Exam Updated Vital Signs BP (!) 163/94 (BP Location: Right Arm)   Pulse 96   Temp 98.6 F (37 C) (Oral)   Resp 18   Ht 5\' 1"  (1.549 m)   Wt 134.3 kg (296 lb)   LMP 07/08/2018   SpO2 100%   BMI 55.93 kg/m   Physical Exam  Constitutional: She appears well-developed and well-nourished.  Non-toxic appearance. No distress.  HENT:  Head: Normocephalic and atraumatic.  Right Ear: Tympanic membrane normal.  Left Ear: Tympanic membrane normal.  Nose: Mucosal edema present.  Mouth/Throat: Uvula is midline.  Eyes: Pupils are equal, round, and reactive to light. Conjunctivae and EOM are normal. Right eye exhibits no discharge. Left eye exhibits no discharge.  Neck: Normal range of motion. Muscular tenderness (L trapezius) present. No spinous process tenderness present. No neck rigidity. No edema and no erythema present.  Cardiovascular: Normal rate and regular rhythm.  No murmur  heard. Pulses:      Radial pulses are 2+ on the right side, and 2+ on the left side.  Pulmonary/Chest: Breath sounds normal. No respiratory distress. She has no wheezes. She has no rales.  Abdominal: Soft. She exhibits no distension. There is no tenderness.  Musculoskeletal:  No obvious deformity, appreciable swelling, erythema, ecchymosis, open wounds.  No peripheral edema.  Back has no midline tenderness.  Extremities are nontender.  Neurological: She is alert.  Clear speech.  CN III through XII grossly intact.  Sensation grossly intact bilateral upper and lower extremities.  Patient has 5 out of 5 symmetric grip strength.  Patient has 5 out of 5 strength plantar dorsiflexion bilaterally.  Negative pronator drift.  Normal finger-nose bilaterally.  Gait steady and intact.  Negative Romberg.  Skin: Skin is warm and dry. No rash noted.  Psychiatric: She has a normal mood and affect. Her behavior is normal.  Nursing note and vitals reviewed.    ED Treatments / Results  Labs Results for orders placed or performed during the hospital encounter of 07/23/18  I-stat Chem 8, ED  Result Value Ref Range   Sodium 140 135 - 145 mmol/L   Potassium 4.4 3.5 - 5.1 mmol/L   Chloride 105 98 - 111 mmol/L   BUN 9 6 - 20 mg/dL   Creatinine, Ser 1.61 0.44 - 1.00 mg/dL   Glucose, Bld 85 70 - 99 mg/dL   Calcium, Ion 0.96 (L) 1.15 - 1.40 mmol/L   TCO2 27 22 - 32 mmol/L   Hemoglobin 12.6 12.0 - 15.0 g/dL   HCT 04.5 40.9 - 81.1 %  I-Stat beta hCG blood, ED  Result Value Ref Range   I-stat hCG, quantitative <5.0 <5 mIU/mL   Comment 3           EKG None  Radiology Ct Head Wo Contrast  Result Date: 07/23/2018 CLINICAL DATA:  Headache beginning 4 days ago. EXAM: CT HEAD WITHOUT CONTRAST TECHNIQUE: Contiguous axial images were obtained from the base of the skull through the vertex without intravenous contrast. COMPARISON:  05/23/2018. 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 The remainder the brain appears normal. No evidence of stroke, hemorrhage, hydrocephalus or extra-axial fluid collection. Vascular: No hyperdense vessel. No evidence of atherosclerotic calcification. Skull: Defect of the cribriform plate region on the right through which the right frontal lobe extends into the sinus region. This could be associated with CSF leak or increased risk of intracranial infection. See below. Sinuses/Orbits: As noted above, there is a right frontal encephalocele resulting in opacification of the right ethmoid sinus. This probably blocks normal drainage of the right frontal sinus which is diminutive but opacified. Other sinuses are clear. Other: None significant IMPRESSION: Abnormal finding in the right frontal region and right ethmoid sinus region consistent with frontal encephalocele. There is opacification of the ethmoid sinus region with bone erosion. The right frontal sinus is opacified. Encephalocele is virtually certain, but the differential diagnosis does include right ethmoid  polyp or mucocele with erosion of the skull base. In this patient with headache, this could certainly be the cause. This skull base defect is fairly small, at about 6 mm. The encephalocele is fairly large, at over 2 cm. Electronically Signed   By: Paulina FusiMark  Shogry M.D.   On: 07/23/2018 22:04    Procedures Procedures (including critical care time)  Medications Ordered in ED Medications  ketorolac (TORADOL) 15 MG/ML injection 15 mg (15 mg Intravenous Given 07/23/18 2340)  prochlorperazine (COMPAZINE) injection 10 mg (10 mg Intravenous Given 07/23/18 2341)  dexamethasone (DECADRON) injection 10 mg (10 mg Intravenous Given 07/23/18 2341)     Initial Impression / Assessment and Plan / ED Course  I have reviewed the triage vital signs and the nursing notes.  Pertinent labs & imaging results that were available during my care of the patient were reviewed by me and  considered in my medical decision making (see chart for details).   Patient presents to the emergency department with left-sided headache as well as some muscle spasms over the past 4 days.  Patient nontoxic-appearing, resting comfortably, vitals notable for hypertension, low suspicion for hypertensive emergency, patient aware of need for recheck.  On exam she has no focal neurologic deficits, she does have some left trapezius tenderness to palpation.  Will evaluate with basic labs and CT of the head.  Patient's labs are unremarkable, no electrolyte disturbance to explain muscle spasm/cramping.  She is not pregnant.  CT of her head did show abnormal findings consistent with a right frontal encephalocele-per radiologist Dr.Shogry it appears that this was present on imaging performed in April 2018 without significant change, however it was not identified or made note of at that time likely due to general radiology interpretation as opposed to neuro radiologist interpretation such as Dr. Karin GoldenShogry.  Recommends neurosurgical consultation for likely outpatient follow up.   23:02 CONSULT: Discussed with neurosurgeon Dr. Venetia MaxonStern- patient will require outpatient office follow up, recommends MRI brain w and wo prior to neurosurgery evaluation- this does not need to be done emergently, can be done as outpatient per his instruction.   Based on H&P and patient work-up fairly non concerning [facility], ICH, ischemic CVA, dural venous sinus thrombosis, acute glaucoma, giant cell arteritis, or meningitis. Suspect that frontal encephalocele is possible etiology, also query L trapezius spasm leading to headaches. Patient had improvement following migraine cocktail and feels ready to go home. Will trial naproxen/robaxin prescriptions- instructed patient not to drive or operate heavy machinery with this medicine. I discussed results, treatment plan, need for PCP and neurosurgery follow-up with MRI, and return precautions with the  patient. Provided opportunity for questions, patient confirmed understanding and is in agreement with plan.   Findings and plan of care discussed with supervising physician Dr. Adela LankFloyd who is in agreement with plan.   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

## 2018-07-23 NOTE — ED Notes (Signed)
Patient transported to CT 

## 2018-07-23 NOTE — ED Triage Notes (Signed)
Pt presents with headache ongoing since Wednesday; has taken ibuprofen, aspirin, and zyrtec without relief; denies photophobia or nausea; pt also reporting muscle spasms to random areas, not painful, just noticed it about the same time as the headache started

## 2018-07-23 NOTE — ED Notes (Signed)
ED Provider at bedside. 

## 2018-07-24 MED ORDER — METHOCARBAMOL 500 MG PO TABS: 500.0000 mg | ORAL_TABLET | Freq: Three times a day (TID) | ORAL | 0 refills | Status: DC | PRN

## 2018-07-24 MED ORDER — NAPROXEN 500 MG PO TABS: 500.0000 mg | ORAL_TABLET | Freq: Two times a day (BID) | ORAL | 0 refills | Status: DC

## 2018-07-24 NOTE — Discharge Instructions (Addendum)
You were seen in the emergency department today for a headache.  Your CT scan showed what is known as a frontal encephalocele-he will need to have an MRI of the brain with and without contrast performed outpatient.  Please discuss with your primary care provider, if you do not have a primary care provider please call her [place] [facility] or call the phone number circled in your discharge instructions to set up primary care and subsequently set up this MRI.   We would like you to have the MRI prior to following up with neurosurgery.  Would like you to follow-up with neurosurgery for this encephalocele for further evaluation and management.  We are sending you home with medications to help with the discomfort you have been having, naproxen and Robaxin. Naproxen is a nonsteroidal anti-inflammatory medication that will help with pain and swelling. Be sure to take this medication as prescribed with food, 1 pill every 12 hours,  It should be taken with food, as it can cause stomach upset, and more seriously, stomach bleeding. Do not take other nonsteroidal anti-inflammatory medications with this such as Advil, Motrin, or Aleve.   Robaxin is the muscle relaxer I have prescribed, this is meant to help with muscle tightness. Be aware that this medication may make you drowsy therefore the first time you take this it should be at a time you are in an environment where you can rest. Do not drive or operate heavy machinery when taking this medication.   We have prescribed you new medication(s) today. Discuss the medications prescribed today with your pharmacist as they can have adverse effects and interactions with your other medicines including over the counter and prescribed medications. Seek medical evaluation if you start to experience new or abnormal symptoms after taking one of these medicines, seek care immediately if you start to experience difficulty breathing, feeling of your throat closing, facial  swelling, or rash as these could be indications of a more serious allergic reaction  Please be sure to follow-up with primary care sometime this week and subsequently with neurosurgery in the next 2 weeks.  Return to the ER for new or worsening symptoms including but not limited to worsening pain, vision change, numbness, weakness, trouble walking, fevers, or any other concerns that you may have.

## 2018-07-24 NOTE — ED Notes (Signed)
Pt verbalizes understanding of d/c instructions. Pt to pick up prescriptions. Pt ambulatory at d/c with all belongings and with family.   

## 2018-08-02 ENCOUNTER — Other Ambulatory Visit (HOSPITAL_COMMUNITY): Payer: Self-pay | Admitting: Neurological Surgery

## 2018-08-02 DIAGNOSIS — Q011 Nasofrontal encephalocele: Secondary | ICD-10-CM

## 2018-08-09 ENCOUNTER — Ambulatory Visit (HOSPITAL_COMMUNITY)
Admission: RE | Admit: 2018-08-09 | Discharge: 2018-08-09 | Disposition: A | Discharge status: Home / Self Care | Payer: Self-pay | Source: Ambulatory Visit | Attending: Neurological Surgery | Admitting: Neurological Surgery

## 2018-08-09 DIAGNOSIS — J3489 Other specified disorders of nose and nasal sinuses: Secondary | ICD-10-CM | POA: Insufficient documentation

## 2018-08-09 DIAGNOSIS — Q011 Nasofrontal encephalocele: Secondary | ICD-10-CM | POA: Insufficient documentation

## 2018-08-09 MED ORDER — GADOBENATE DIMEGLUMINE 529 MG/ML IV SOLN
10.0000 mL | Freq: Once | INTRAVENOUS | Status: AC | PRN
  Administered 2018-08-09: 10 mL via INTRAVENOUS

## 2018-08-31 ENCOUNTER — Inpatient Hospital Stay: Payer: Self-pay | Admitting: Family Medicine

## 2018-09-08 ENCOUNTER — Other Ambulatory Visit: Payer: Self-pay | Admitting: Neurological Surgery

## 2018-09-21 NOTE — H&P (Signed)
HPI:   Anna Morrison is a 32 y.o. female who presents as a consult Patient.   Referring Provider: Self, A Referral  Chief complaint: Severe headache.  HPI: For the past couple of months she has been had intermittent severe headaches, sometimes with aura, sometimes bad enough that she just closes her eyes and goes to sleep. She denies any nasal obstruction or discharge but she does suffer with postnasal drainage and reflux. She saw a neurosurgeon recently. She has had a CT of the head and MRI a frontal encephalocele was identified. The neurosurgeon recommended referral to [place] and consideration for joint surgical treatment of this.  PMH/Meds/All/SocHx/FamHx/ROS:   History reviewed. No pertinent past medical history.  History reviewed. No pertinent surgical history.  No family history of bleeding disorders, wound healing problems or difficulty with anesthesia.   Social History   Social History  . Marital status: N/A  Spouse name: N/A  . Number of children: N/A  . Years of education: N/A   Occupational History  . Not on file.   Social History Main Topics  . Smoking status: Former Games developer  . Smokeless tobacco: Former Neurosurgeon  . Alcohol use Not on file  . Drug use: Unknown  . Sexual activity: Not on file   Other Topics Concern  . Not on file   Social History Narrative  . No narrative on file   Current Outpatient Prescriptions:  . docusate sodium (COLACE) 100 MG capsule, Take 100 mg by mouth., Disp: , Rfl:  . ferrous sulfate (IRON ORAL), Take by mouth., Disp: , Rfl:   A complete ROS was performed with pertinent positives/negatives noted in the HPI. The remainder of the ROS are negative.   Physical Exam:   Ht 1.549 m (5\' 1" )  Wt 132 kg (291 lb)  BMI 54.98 kg/m   General: Obese young lady, in no distress, breathing easily. Normal affect. In a pleasant mood. Head: Normocephalic, atraumatic. No masses, or scars. Eyes: Pupils are equal, and reactive to light. Vision is  grossly intact. No spontaneous or gaze nystagmus. Ears: Ear canals are clear. Tympanic membranes are intact, with normal landmarks and the middle ears are clear and healthy. Hearing: Grossly normal. Nose: Nasal cavities are clear with healthy mucosa, no polyps or exudate. Airways are patent. Face: No masses or scars, facial nerve function is symmetric. Oral Cavity: No mucosal abnormalities are noted. Tongue with normal mobility. Dentition appears healthy. Oropharynx: Tonsils are symmetric. There are no mucosal masses identified. Tongue base appears normal and healthy. Larynx/Hypopharynx: deferred Chest: Deferred Neck: No palpable masses, no cervical adenopathy, no thyroid nodules or enlargement. Neuro: Cranial nerves II-XII with normal function. Balance: Normal gate. Other findings: none.  Independent Review of Additional Tests or Records:   Procedures:  none  Impression & Plans:  Right frontal encephalocele with severe headaches. Cause of this unknown. I will discuss with the neurosurgeon and we will plan surgical care of this. I have reiterated to her symptoms to be aware of and to come to the emergency department immediately if she develops any intense headache, different from what she had before, stiff neck, back pain, high fever, or anything else that seems out of sort.

## 2018-10-02 ENCOUNTER — Encounter (HOSPITAL_COMMUNITY)
Admission: RE | Admit: 2018-10-02 | Discharge: 2018-10-02 | Disposition: A | Discharge status: Home / Self Care | Payer: Medicaid Other | Source: Ambulatory Visit | Attending: Neurological Surgery | Admitting: Neurological Surgery

## 2018-10-02 ENCOUNTER — Other Ambulatory Visit: Payer: Self-pay

## 2018-10-02 ENCOUNTER — Encounter (HOSPITAL_COMMUNITY): Payer: Self-pay

## 2018-10-02 DIAGNOSIS — Z01812 Encounter for preprocedural laboratory examination: Secondary | ICD-10-CM | POA: Insufficient documentation

## 2018-10-02 HISTORY — DX: Headache: R51

## 2018-10-02 HISTORY — DX: Gastro-esophageal reflux disease without esophagitis: K21.9

## 2018-10-02 HISTORY — DX: Anemia, unspecified: D64.9

## 2018-10-02 HISTORY — DX: Other constipation: K59.09

## 2018-10-02 HISTORY — DX: Pneumonia, unspecified organism: J18.9

## 2018-10-02 HISTORY — DX: Headache, unspecified: R51.9

## 2018-10-02 LAB — BASIC METABOLIC PANEL
ANION GAP: 8 (ref 5–15)
BUN: 7 mg/dL (ref 6–20)
CALCIUM: 9.8 mg/dL (ref 8.9–10.3)
CHLORIDE: 104 mmol/L (ref 98–111)
CO2: 27 mmol/L (ref 22–32)
CREATININE: 0.68 mg/dL (ref 0.44–1.00)
GFR calc non Af Amer: >60 mL/min (ref >60)
Glucose, Bld: 89 mg/dL (ref 70–99)
Potassium: 3.8 mmol/L (ref 3.5–5.1)
SODIUM: 139 mmol/L (ref 135–145)

## 2018-10-02 LAB — TYPE AND SCREEN
ABO/RH(D): B POS
ANTIBODY SCREEN: NEGATIVE

## 2018-10-02 LAB — CBC
HCT: 39.5 % (ref 36.0–46.0)
Hemoglobin: 10.7 g/dL — ABNORMAL LOW (ref 12.0–15.0)
MCH: 19.2 pg — AB (ref 26.0–34.0)
MCHC: 27.1 g/dL — AB (ref 30.0–36.0)
MCV: 70.8 fL — ABNORMAL LOW (ref 80.0–100.0)
Platelets: 461 10*3/uL — ABNORMAL HIGH (ref 150–400)
RBC: 5.58 MIL/uL — ABNORMAL HIGH (ref 3.87–5.11)
RDW: 19.3 % — ABNORMAL HIGH (ref 11.5–15.5)
WBC: 10.5 10*3/uL (ref 4.0–10.5)
nRBC: 0 % (ref 0.0–0.2)

## 2018-10-02 LAB — ABO/RH: ABO/RH(D): B POS

## 2018-10-02 NOTE — Progress Notes (Signed)
Pt denies cardiac history, HTN or diabetes.  

## 2018-10-02 NOTE — Pre-Procedure Instructions (Signed)
Anna Morrison  10/02/2018    Your procedure is scheduled on Monday, October 09, 2018 at 7:30 AM.   Report to [facility] Entrance "A" Admitting Office at 5:30 AM.   Call this number if you have problems the morning of surgery: [phone]   Questions prior to day of surgery, please call [phone] between 8 & 4 PM.   Remember:  Do not eat or drink after midnight Sunday, 10/08/18.  Take these medicines the morning of surgery with A SIP OF WATER: Ranitidine (Zantac), Tylenol - if needed, Cetirizine (Zyrtec) - if needed  Do not use Aspirin products, NSAIDS (Ibuprofen, Aleve, etc) or Herbal products prior to surgery.     Do not wear jewelry, make-up or nail polish.  Do not wear lotions, powders, perfumes or deodorant.  Do not shave 48 hours prior to surgery.    Do not bring valuables to the hospital.  [facility] is not responsible for any belongings or valuables.  Contacts, dentures or bridgework may not be worn into surgery.  Leave your suitcase in the car.  After surgery it may be brought to your room.  For patients admitted to the hospital, discharge time will be determined by your treatment team.  [facility] - Preparing for Surgery  Before surgery, you can play an important role.  Because skin is not sterile, your skin needs to be as free of germs as possible.  You can reduce the number of germs on you skin by washing with CHG (chlorahexidine gluconate) soap before surgery.  CHG is an antiseptic cleaner which kills germs and bonds with the skin to continue killing germs even after washing.  Oral Hygiene is also important in reducing the risk of infection.  Remember to brush your teeth with your regular toothpaste the morning of surgery.  Please DO NOT use if you have an allergy to CHG or antibacterial soaps.  If your skin becomes reddened/irritated stop using the CHG and inform your nurse when you arrive at Short Stay.  Do not shave (including legs and underarms)  for at least 48 hours prior to the first CHG shower.  You may shave your face.  Please follow these instructions carefully:   1.  Shower with CHG Soap the night before surgery and the morning of Surgery.  2.  If you choose to wash your hair, wash your hair first as usual with your normal shampoo.  3.  After you shampoo, rinse your hair and body thoroughly to remove the shampoo. 4.  Use CHG as you would any other liquid soap.  You can apply chg directly to the skin and wash gently with a      scrungie or washcloth.           5.  Apply the CHG Soap to your body ONLY FROM THE NECK DOWN.   Do not use on open wounds or open sores. Avoid contact with your eyes, ears, mouth and genitals (private parts).  Wash genitals (private parts) with your normal soap.  6.  Wash thoroughly, paying special attention to the area where your surgery will be performed.  7.  Thoroughly rinse your body with warm water from the neck down.  8.  DO NOT shower/wash with your normal soap after using and rinsing off the CHG Soap.  9.  Pat yourself dry with a clean towel.            10 .  Wear clean pajamas.  11.  Place clean sheets on your bed the night of your first shower and do not sleep with pets.  Day of Surgery  Shower as above. Do not apply any lotions/deodorants the morning of surgery.   Please wear clean clothes to the hospital. Remember to brush your teeth with toothpaste.   Please read over the fact sheets that you were given.

## 2018-10-06 MED ORDER — DEXTROSE 5 % IV SOLN
3.0000 g | INTRAVENOUS | Status: AC
  Administered 2018-10-09 (×2): 3 g via INTRAVENOUS
  Filled 2018-10-06: qty 3

## 2018-10-08 ENCOUNTER — Encounter (HOSPITAL_COMMUNITY): Payer: Self-pay | Admitting: Anesthesiology

## 2018-10-08 NOTE — Anesthesia Preprocedure Evaluation (Addendum)
Anesthesia Evaluation  Patient identified by MRN, date of birth, ID band Patient awake    Reviewed: Allergy & Precautions, NPO status , Patient's Chart, lab work & pertinent test results  Airway Mallampati: I  TM Distance: >3 FB Neck ROM: Full    Dental no notable dental hx. (+) Teeth Intact   Pulmonary pneumonia, resolved,    Pulmonary exam normal breath sounds clear to auscultation       Cardiovascular negative cardio ROS Normal cardiovascular exam Rate:Normal     Neuro/Psych  Headaches, Nasofrontal encephalocele negative psych ROS   GI/Hepatic Neg liver ROS, GERD  ,  Endo/Other  Morbid obesity  Renal/GU negative Renal ROS  negative genitourinary   Musculoskeletal negative musculoskeletal ROS (+)   Abdominal (+) + obese,   Peds  Hematology negative hematology ROS (+) anemia ,   Anesthesia Other Findings Day of surgery medications reviewed with the patient.  Reproductive/Obstetrics                            Anesthesia Physical Anesthesia Plan  ASA: III  Anesthesia Plan: General   Post-op Pain Management:    Induction: Intravenous  PONV Risk Score and Plan: 4 or greater and Scopolamine patch - Pre-op, Midazolam, Ondansetron, Dexamethasone and Treatment may vary due to age or medical condition  Airway Management Planned: Oral ETT  Additional Equipment: Arterial line  Intra-op Plan:   Post-operative Plan: Extubation in OR  Informed Consent: I have reviewed the patients History and Physical, chart, labs and discussed the procedure including the risks, benefits and alternatives for the proposed anesthesia with the patient or authorized representative who has indicated his/her understanding and acceptance.   Dental advisory given  Plan Discussed with: CRNA and Surgeon  Anesthesia Plan Comments:       Anesthesia Quick Evaluation

## 2018-10-09 ENCOUNTER — Inpatient Hospital Stay (HOSPITAL_COMMUNITY): Payer: Self-pay | Admitting: Anesthesiology

## 2018-10-09 ENCOUNTER — Other Ambulatory Visit: Payer: Self-pay

## 2018-10-09 ENCOUNTER — Inpatient Hospital Stay (HOSPITAL_COMMUNITY)
Admission: RE | Admit: 2018-10-09 | Discharge: 2018-10-10 | DRG: 982 | Disposition: A | Discharge status: Home / Self Care | Payer: Self-pay | Source: Ambulatory Visit | Attending: Neurological Surgery | Admitting: Neurological Surgery

## 2018-10-09 ENCOUNTER — Encounter (HOSPITAL_COMMUNITY)
Admission: RE | Disposition: A | Discharge status: Home / Self Care | Payer: Self-pay | Source: Ambulatory Visit | Attending: Neurological Surgery

## 2018-10-09 ENCOUNTER — Inpatient Hospital Stay (HOSPITAL_COMMUNITY): Payer: Self-pay | Admitting: Emergency Medicine

## 2018-10-09 ENCOUNTER — Encounter (HOSPITAL_COMMUNITY): Payer: Self-pay

## 2018-10-09 DIAGNOSIS — Q011 Nasofrontal encephalocele: Principal | ICD-10-CM

## 2018-10-09 DIAGNOSIS — G960 Cerebrospinal fluid leak: Secondary | ICD-10-CM | POA: Diagnosis present

## 2018-10-09 DIAGNOSIS — Z6841 Body Mass Index (BMI) 40.0 and over, adult: Secondary | ICD-10-CM

## 2018-10-09 DIAGNOSIS — Z87891 Personal history of nicotine dependence: Secondary | ICD-10-CM

## 2018-10-09 HISTORY — PX: ETHMOIDECTOMY: SHX5197

## 2018-10-09 HISTORY — PX: OTHER SURGICAL HISTORY: SHX169

## 2018-10-09 HISTORY — DX: Nasofrontal encephalocele: Q01.1

## 2018-10-09 HISTORY — PX: REPAIR OF CEREBROSPINAL FLUID LEAK: SHX6322

## 2018-10-09 HISTORY — PX: NASAL SINUS SURGERY: SHX719

## 2018-10-09 LAB — POCT PREGNANCY, URINE: PREG TEST UR: NEGATIVE

## 2018-10-09 SURGERY — REPAIR OF CEREBROSPINAL FLUID LEAK
Anesthesia: General | Site: Nose

## 2018-10-09 MED ORDER — LIDOCAINE-EPINEPHRINE 1 %-1:100000 IJ SOLN
INTRAMUSCULAR | Status: DC | PRN
  Administered 2018-10-09: 5 mL

## 2018-10-09 MED ORDER — ONDANSETRON HCL 4 MG/2ML IJ SOLN: 4.0000 mg | Freq: Once | INTRAMUSCULAR | Status: DC | PRN

## 2018-10-09 MED ORDER — DEXAMETHASONE SODIUM PHOSPHATE 10 MG/ML IJ SOLN
INTRAMUSCULAR | Status: AC
  Filled 2018-10-09: qty 1

## 2018-10-09 MED ORDER — ROCURONIUM BROMIDE 50 MG/5ML IV SOSY
PREFILLED_SYRINGE | INTRAVENOUS | Status: AC
  Filled 2018-10-09: qty 15

## 2018-10-09 MED ORDER — ACETAMINOPHEN 650 MG RE SUPP: 650.0000 mg | RECTAL | Status: DC | PRN

## 2018-10-09 MED ORDER — PROPOFOL 10 MG/ML IV BOLUS
INTRAVENOUS | Status: DC | PRN
  Administered 2018-10-09: 180 mg via INTRAVENOUS

## 2018-10-09 MED ORDER — SCOPOLAMINE 1 MG/3DAYS TD PT72
1.0000 | MEDICATED_PATCH | TRANSDERMAL | Status: DC
  Administered 2018-10-09: 1 via TRANSDERMAL

## 2018-10-09 MED ORDER — 0.9 % SODIUM CHLORIDE (POUR BTL) OPTIME
TOPICAL | Status: DC | PRN
  Administered 2018-10-09: 1000 mL

## 2018-10-09 MED ORDER — ACETAMINOPHEN 325 MG PO TABS: 650.0000 mg | ORAL_TABLET | ORAL | Status: DC | PRN

## 2018-10-09 MED ORDER — LIDOCAINE-EPINEPHRINE 1 %-1:100000 IJ SOLN
INTRAMUSCULAR | Status: AC
  Filled 2018-10-09: qty 1

## 2018-10-09 MED ORDER — HEMOSTATIC AGENTS (NO CHARGE) OPTIME
TOPICAL | Status: DC | PRN
  Administered 2018-10-09: 1 via TOPICAL

## 2018-10-09 MED ORDER — LACTATED RINGERS IV SOLN
INTRAVENOUS | Status: DC | PRN
  Administered 2018-10-09: 08:00:00 via INTRAVENOUS

## 2018-10-09 MED ORDER — OXYMETAZOLINE HCL 0.05 % NA SOLN
2.0000 | NASAL | Status: DC
  Administered 2018-10-09: 2 via NASAL
  Filled 2018-10-09: qty 15

## 2018-10-09 MED ORDER — FENTANYL CITRATE (PF) 100 MCG/2ML IJ SOLN
INTRAMUSCULAR | Status: DC | PRN
  Administered 2018-10-09 (×2): 50 ug via INTRAVENOUS
  Administered 2018-10-09: 100 ug via INTRAVENOUS
  Administered 2018-10-09 (×2): 50 ug via INTRAVENOUS

## 2018-10-09 MED ORDER — ONDANSETRON HCL 4 MG PO TABS: 4.0000 mg | ORAL_TABLET | ORAL | Status: DC | PRN

## 2018-10-09 MED ORDER — SUGAMMADEX SODIUM 200 MG/2ML IV SOLN
INTRAVENOUS | Status: DC | PRN
  Administered 2018-10-09: 300 mg via INTRAVENOUS

## 2018-10-09 MED ORDER — OXYMETAZOLINE HCL 0.05 % NA SOLN
NASAL | Status: DC | PRN
  Administered 2018-10-09: 1 via TOPICAL

## 2018-10-09 MED ORDER — THROMBIN 5000 UNITS EX SOLR
CUTANEOUS | Status: AC
  Filled 2018-10-09: qty 5000

## 2018-10-09 MED ORDER — ONDANSETRON HCL 4 MG/2ML IJ SOLN: 4.0000 mg | INTRAMUSCULAR | Status: DC | PRN

## 2018-10-09 MED ORDER — MIDAZOLAM HCL 2 MG/2ML IJ SOLN
INTRAMUSCULAR | Status: AC
  Filled 2018-10-09: qty 2

## 2018-10-09 MED ORDER — PROPOFOL 10 MG/ML IV BOLUS
INTRAVENOUS | Status: AC
  Filled 2018-10-09: qty 20

## 2018-10-09 MED ORDER — ROCURONIUM BROMIDE 50 MG/5ML IV SOSY
PREFILLED_SYRINGE | INTRAVENOUS | Status: AC
  Filled 2018-10-09: qty 5

## 2018-10-09 MED ORDER — ROCURONIUM BROMIDE 100 MG/10ML IV SOLN
INTRAVENOUS | Status: DC | PRN
  Administered 2018-10-09: 20 mg via INTRAVENOUS
  Administered 2018-10-09: 50 mg via INTRAVENOUS
  Administered 2018-10-09 (×2): 20 mg via INTRAVENOUS
  Administered 2018-10-09: 10 mg via INTRAVENOUS
  Administered 2018-10-09: 50 mg via INTRAVENOUS
  Administered 2018-10-09: 20 mg via INTRAVENOUS

## 2018-10-09 MED ORDER — SODIUM CHLORIDE 0.9 % IR SOLN
Status: DC | PRN
  Administered 2018-10-09: 1000 mL

## 2018-10-09 MED ORDER — LORATADINE 10 MG PO TABS
10.0000 mg | ORAL_TABLET | Freq: Every day | ORAL | Status: DC
  Administered 2018-10-10: 10 mg via ORAL
  Filled 2018-10-09: qty 1

## 2018-10-09 MED ORDER — OXYMETAZOLINE HCL 0.05 % NA SOLN
NASAL | Status: AC
  Filled 2018-10-09: qty 15

## 2018-10-09 MED ORDER — DOCUSATE SODIUM 100 MG PO CAPS
100.0000 mg | ORAL_CAPSULE | Freq: Two times a day (BID) | ORAL | Status: DC
  Administered 2018-10-09 – 2018-10-10 (×2): 100 mg via ORAL
  Filled 2018-10-09 (×2): qty 1

## 2018-10-09 MED ORDER — CHLORHEXIDINE GLUCONATE CLOTH 2 % EX PADS: 6.0000 | MEDICATED_PAD | Freq: Once | CUTANEOUS | Status: DC

## 2018-10-09 MED ORDER — SCOPOLAMINE 1 MG/3DAYS TD PT72
MEDICATED_PATCH | TRANSDERMAL | Status: AC
  Filled 2018-10-09: qty 1

## 2018-10-09 MED ORDER — FENTANYL CITRATE (PF) 250 MCG/5ML IJ SOLN
INTRAMUSCULAR | Status: AC
  Filled 2018-10-09: qty 5

## 2018-10-09 MED ORDER — LIDOCAINE 2% (20 MG/ML) 5 ML SYRINGE
INTRAMUSCULAR | Status: DC | PRN
  Administered 2018-10-09: 80 mg via INTRAVENOUS

## 2018-10-09 MED ORDER — DEXTROSE 5 % IV SOLN
3.0000 g | INTRAVENOUS | Status: DC
  Filled 2018-10-09: qty 3000

## 2018-10-09 MED ORDER — MIDAZOLAM HCL 5 MG/5ML IJ SOLN
INTRAMUSCULAR | Status: DC | PRN
  Administered 2018-10-09: 2 mg via INTRAVENOUS

## 2018-10-09 MED ORDER — HYDROMORPHONE HCL 1 MG/ML IJ SOLN: 0.5000 mg | INTRAMUSCULAR | Status: DC | PRN

## 2018-10-09 MED ORDER — PROMETHAZINE HCL 25 MG PO TABS: 12.5000 mg | ORAL_TABLET | ORAL | Status: DC | PRN

## 2018-10-09 MED ORDER — HYDROCODONE-ACETAMINOPHEN 5-325 MG PO TABS: 1.0000 | ORAL_TABLET | ORAL | Status: DC | PRN

## 2018-10-09 MED ORDER — ONDANSETRON HCL 4 MG/2ML IJ SOLN
INTRAMUSCULAR | Status: AC
  Filled 2018-10-09: qty 2

## 2018-10-09 MED ORDER — DEXAMETHASONE SODIUM PHOSPHATE 10 MG/ML IJ SOLN
INTRAMUSCULAR | Status: DC | PRN
  Administered 2018-10-09: 10 mg via INTRAVENOUS

## 2018-10-09 MED ORDER — SUCCINYLCHOLINE 20MG/ML (10ML) SYRINGE FOR MEDFUSION PUMP - OPTIME
INTRAMUSCULAR | Status: DC | PRN
  Administered 2018-10-09: 140 mg via INTRAVENOUS

## 2018-10-09 MED ORDER — FENTANYL CITRATE (PF) 100 MCG/2ML IJ SOLN: 25.0000 ug | INTRAMUSCULAR | Status: DC | PRN

## 2018-10-09 MED ORDER — SUCCINYLCHOLINE CHLORIDE 200 MG/10ML IV SOSY
PREFILLED_SYRINGE | INTRAVENOUS | Status: AC
  Filled 2018-10-09: qty 10

## 2018-10-09 MED ORDER — FAMOTIDINE 20 MG PO TABS
10.0000 mg | ORAL_TABLET | Freq: Every day | ORAL | Status: DC
  Administered 2018-10-10: 10 mg via ORAL
  Filled 2018-10-09: qty 1

## 2018-10-09 MED ORDER — OXYCODONE-ACETAMINOPHEN 5-325 MG PO TABS: 1.0000 | ORAL_TABLET | ORAL | Status: DC | PRN

## 2018-10-09 MED ORDER — MEPERIDINE HCL 50 MG/ML IJ SOLN: 6.2500 mg | INTRAMUSCULAR | Status: DC | PRN

## 2018-10-09 MED ORDER — SODIUM CHLORIDE 0.9 % IV SOLN
INTRAVENOUS | Status: DC | PRN
  Administered 2018-10-09: 10 ug/min via INTRAVENOUS

## 2018-10-09 MED ORDER — SUGAMMADEX SODIUM 500 MG/5ML IV SOLN
INTRAVENOUS | Status: AC
  Filled 2018-10-09: qty 5

## 2018-10-09 MED ORDER — HYDROCODONE-ACETAMINOPHEN 7.5-325 MG PO TABS: 1.0000 | ORAL_TABLET | Freq: Once | ORAL | Status: DC | PRN

## 2018-10-09 MED ORDER — LACTATED RINGERS IV SOLN
INTRAVENOUS | Status: DC | PRN
  Administered 2018-10-09 (×2): via INTRAVENOUS

## 2018-10-09 MED ORDER — ONDANSETRON HCL 4 MG/2ML IJ SOLN
INTRAMUSCULAR | Status: DC | PRN
  Administered 2018-10-09: 4 mg via INTRAVENOUS

## 2018-10-09 MED ORDER — THROMBIN 5000 UNITS EX SOLR
OROMUCOSAL | Status: DC | PRN
  Administered 2018-10-09: 5 mL via TOPICAL

## 2018-10-09 SURGICAL SUPPLY — 102 items
ATTRACTOMAT 16X20 MAGNETIC DRP (DRAPES) IMPLANT
BLADE CLIPPER SURG (BLADE) IMPLANT
BLADE RAD40 ROTATE 4M 4 5PK (BLADE) IMPLANT
BLADE RAD60 ROTATE M4 4 5PK (BLADE) IMPLANT
BLADE ROTATE TRICUT 4X13 M4 (BLADE) IMPLANT
BLADE SURG 15 STRL LF DISP TIS (BLADE) ×1 IMPLANT
BLADE SURG 15 STRL SS (BLADE) ×2
BLADE TRICUT ROTATE M4 4 5PK (BLADE) ×2 IMPLANT
BNDG GAUZE ELAST 4 BULKY (GAUZE/BANDAGES/DRESSINGS) IMPLANT
BUR ACORN 9.0 PRECISION (BURR) IMPLANT
BUR MATCHSTICK NEURO 3.0 LAGG (BURR) IMPLANT
BUR SPIRAL ROUTER 2.3 (BUR) IMPLANT
CANISTER SUCT 3000ML PPV (MISCELLANEOUS) ×4 IMPLANT
CLEANER TIP ELECTROSURG 2X2 (MISCELLANEOUS) ×2 IMPLANT
CLIP VESOCCLUDE MED 6/CT (CLIP) IMPLANT
CONT SPEC STER OR (MISCELLANEOUS) ×2 IMPLANT
COVER WAND RF STERILE (DRAPES) ×2 IMPLANT
DECANTER SPIKE VIAL GLASS SM (MISCELLANEOUS) ×2 IMPLANT
DRAPE HALF SHEET 40X57 (DRAPES) IMPLANT
DRAPE NEUROLOGICAL W/INCISE (DRAPES) IMPLANT
DRAPE SHEET LG 3/4 BI-LAMINATE (DRAPES) IMPLANT
DRAPE SURG 17X23 STRL (DRAPES) IMPLANT
DRAPE WARM FLUID 44X44 (DRAPE) IMPLANT
DRESSING NASAL KENNEDY 3.5X.9 (MISCELLANEOUS) IMPLANT
DRESSING NASAL POPE 10X1.5X2.5 (GAUZE/BANDAGES/DRESSINGS) ×1 IMPLANT
DRSG NASAL KENNEDY 3.5X.9 (MISCELLANEOUS)
DRSG NASAL POPE 10X1.5X2.5 (GAUZE/BANDAGES/DRESSINGS) ×2
DRSG NASOPORE 8CM (GAUZE/BANDAGES/DRESSINGS) ×2 IMPLANT
DURAMATRIX ONLAY 2X2 (Neuro Prosthesis/Implant) ×2 IMPLANT
DURAPREP 6ML APPLICATOR 50/CS (WOUND CARE) IMPLANT
ELECT NEEDLE BLADE 2-5/6 (NEEDLE) ×2 IMPLANT
ELECT REM PT RETURN 9FT ADLT (ELECTROSURGICAL) ×2
ELECTRODE REM PT RTRN 9FT ADLT (ELECTROSURGICAL) ×1 IMPLANT
EVACUATOR 1/8 PVC DRAIN (DRAIN) IMPLANT
EVACUATOR SILICONE 100CC (DRAIN) IMPLANT
FILTER ARTHROSCOPY CONVERTOR (FILTER) ×2 IMPLANT
FLOSEAL 5ML (HEMOSTASIS) IMPLANT
GAUZE 4X4 16PLY RFD (DISPOSABLE) IMPLANT
GAUZE SPONGE 4X4 12PLY STRL (GAUZE/BANDAGES/DRESSINGS) IMPLANT
GLOVE BIO SURGEON STRL SZ7 (GLOVE) ×2 IMPLANT
GLOVE BIO SURGEON STRL SZ7.5 (GLOVE) ×2 IMPLANT
GLOVE BIOGEL PI IND STRL 6.5 (GLOVE) ×2 IMPLANT
GLOVE BIOGEL PI IND STRL 7.5 (GLOVE) ×3 IMPLANT
GLOVE BIOGEL PI INDICATOR 6.5 (GLOVE) ×2
GLOVE BIOGEL PI INDICATOR 7.5 (GLOVE) ×3
GLOVE ECLIPSE 7.5 STRL STRAW (GLOVE) ×2 IMPLANT
GLOVE EXAM NITRILE LRG STRL (GLOVE) IMPLANT
GLOVE EXAM NITRILE XL STR (GLOVE) IMPLANT
GLOVE EXAM NITRILE XS STR PU (GLOVE) IMPLANT
GLOVE SURG SS PI 6.0 STRL IVOR (GLOVE) ×4 IMPLANT
GLOVE SURG SS PI 7.0 STRL IVOR (GLOVE) ×4 IMPLANT
GOWN STRL REUS W/ TWL LRG LVL3 (GOWN DISPOSABLE) ×3 IMPLANT
GOWN STRL REUS W/ TWL XL LVL3 (GOWN DISPOSABLE) ×1 IMPLANT
GOWN STRL REUS W/TWL 2XL LVL3 (GOWN DISPOSABLE) IMPLANT
GOWN STRL REUS W/TWL LRG LVL3 (GOWN DISPOSABLE) ×3
GOWN STRL REUS W/TWL XL LVL3 (GOWN DISPOSABLE) ×1
HEMOSTAT SURGICEL 2X14 (HEMOSTASIS) IMPLANT
KIT BASIN OR (CUSTOM PROCEDURE TRAY) ×2 IMPLANT
KIT TURNOVER KIT B (KITS) ×2 IMPLANT
MARKER SKIN DUAL TIP RULER LAB (MISCELLANEOUS) ×2 IMPLANT
NEEDLE HYPO 22GX1.5 SAFETY (NEEDLE) ×2 IMPLANT
NEEDLE PRECISIONGLIDE 27X1.5 (NEEDLE) IMPLANT
NS IRRIG 1000ML POUR BTL (IV SOLUTION) ×2 IMPLANT
Non Reinforced Medical Grade Silicone Sheeting ×2 IMPLANT
PACK CRANIOTOMY CUSTOM (CUSTOM PROCEDURE TRAY) ×2 IMPLANT
PAD ARMBOARD 7.5X6 YLW CONV (MISCELLANEOUS) ×8 IMPLANT
PATTIES SURGICAL .5 X.5 (GAUZE/BANDAGES/DRESSINGS) ×2 IMPLANT
PATTIES SURGICAL .5 X3 (DISPOSABLE) IMPLANT
PATTIES SURGICAL 1X1 (DISPOSABLE) IMPLANT
PENCIL FOOT CONTROL (ELECTROSURGICAL) ×2 IMPLANT
SEALANT ADHERUS EXTEND TIP (MISCELLANEOUS) ×2 IMPLANT
SHEATH ENDOSCRUB 0 DEG (SHEATH) IMPLANT
SHEATH ENDOSCRUB 30 DEG (SHEATH) IMPLANT
SPECIMEN JAR SMALL (MISCELLANEOUS) IMPLANT
SPONGE NEURO XRAY DETECT 1X3 (DISPOSABLE) IMPLANT
SPONGE SURGIFOAM ABS GEL 100 (HEMOSTASIS) IMPLANT
STAPLER VISISTAT 35W (STAPLE) ×2 IMPLANT
STOCKINETTE 6  STRL (DRAPES)
STOCKINETTE 6 STRL (DRAPES) IMPLANT
SUT ETHILON 3 0 FSL (SUTURE) IMPLANT
SUT ETHILON 3 0 PS 1 (SUTURE) IMPLANT
SUT NURALON 4 0 TR CR/8 (SUTURE) IMPLANT
SUT STEEL 0 (SUTURE)
SUT STEEL 0 18XMFL TIE 17 (SUTURE) IMPLANT
SUT VIC AB 0 CT1 18XCR BRD8 (SUTURE) IMPLANT
SUT VIC AB 0 CT1 8-18 (SUTURE)
SUT VIC AB 3-0 SH 8-18 (SUTURE) IMPLANT
SWAB COLLECTION DEVICE MRSA (MISCELLANEOUS) IMPLANT
SWAB CULTURE ESWAB REG 1ML (MISCELLANEOUS) IMPLANT
SYR 50ML SLIP (SYRINGE) IMPLANT
TOWEL GREEN STERILE (TOWEL DISPOSABLE) ×4 IMPLANT
TOWEL GREEN STERILE FF (TOWEL DISPOSABLE) ×4 IMPLANT
TOWEL OR 17X24 6PK STRL BLUE (TOWEL DISPOSABLE) ×2 IMPLANT
TRACKER ENT INSTRUMENT (MISCELLANEOUS) ×2 IMPLANT
TRACKER ENT PATIENT (MISCELLANEOUS) IMPLANT
TRAY ENT MC OR (CUSTOM PROCEDURE TRAY) ×2 IMPLANT
TRAY FOLEY MTR SLVR 16FR STAT (SET/KITS/TRAYS/PACK) ×2 IMPLANT
TUBE CONNECTING 12X1/4 (SUCTIONS) IMPLANT
TUBING EXTENTION W/L.L. (IV SETS) IMPLANT
TUBING STRAIGHTSHOT EPS 5PK (TUBING) IMPLANT
UNDERPAD 30X30 (UNDERPADS AND DIAPERS) IMPLANT
WATER STERILE IRR 1000ML POUR (IV SOLUTION) ×4 IMPLANT

## 2018-10-09 NOTE — Op Note (Signed)
PATIENT: Anna Morrison  DAY OF SURGERY: 10/09/18   PRE-OPERATIVE DIAGNOSIS:  Nasofrontal encephalocele   POST-OPERATIVE DIAGNOSIS:  Nasofrontal encephalocele   PROCEDURE:  Endoscopic endonasal repair of nasofrontal encephalocele, harvest of tensor fascia lata graft, sinonasal endoscopy for repair of CSF leak of the fovea ethmoidalis   SURGEON:  Surgeon(s) and Role:    Jadene Pierini, MD - Co-surgeon    [name], MD - Co-surgeon   ANESTHESIA: ETGA   BRIEF HISTORY: This is a 32 year old woman who presented with headaches and a CSF leak. The patient was found to have an encephalocele of the anterior cranial fossa centered around her fovea ethmoidalis. This was discussed with the patient as well as risks, benefits, and alternatives and wished to proceed with surgical repair.   OPERATIVE DETAIL: The patient was taken to the operating room and placed on the OR table in the supine position. A formal time out was performed with two patient identifiers and confirmed the operative site. Anesthesia was induced by the anesthesia team. The surgical area was then prepped and draped in a sterile fashion. Dr. Pollyann Kennedy performed an endonasal endoscopic approach and located the encephalocele. The encephalocele was then cauterized and reduced. A 'gasket seal' technique was used that consisted of dural graft was then placed as an inlay and followed by a bone autograft. Dr. Pollyann Kennedy had harvested an excellent nasoseptal flap that was then placed as the final layer, which was secured with Adheris fibrin glue. Closure was completed by Dr. Pollyann Kennedy, which is detailed in his operative note.   EBL:    DRAINS: none   SPECIMENS: Encephalocele contents   Jadene Pierini, MD 10/09/18 7:45 AM

## 2018-10-09 NOTE — Anesthesia Procedure Notes (Signed)
Arterial Line Insertion Start/End10/21/2019 7:35 AM, 10/09/2018 8:00 AM Performed by: Mal Amabile, MD, Dorie Rank, CRNA, anesthesiologist, CRNA  Preanesthetic checklist: patient identified, IV checked, site marked, risks and benefits discussed, surgical consent, monitors and equipment checked, pre-op evaluation and timeout performed Right, radial was placed Catheter size: 20 G Hand hygiene performed , maximum sterile barriers used  and Seldinger technique used Allen's test indicative of satisfactory collateral circulation Attempts: 3 Procedure performed without using ultrasound guided technique. Following insertion, dressing applied and Biopatch. Post procedure assessment: normal  Patient tolerated the procedure well with no immediate complications. Additional procedure comments: 2 attempts by [facility] Q on left.  1 attempt by Helmut Muster on Right .

## 2018-10-09 NOTE — Transfer of Care (Signed)
Immediate Anesthesia Transfer of Care Note  Patient: Anna Morrison  Procedure(s) Performed: Endoscopic endonasal repair of ethmoidal encephalocele (N/A Nose) NASAL ENDOSCOPY ,REPAIR OF FRONTAL ENCEPHALOCELE,HARVEST OF SEPTO BONE/CARTLEDGE (N/A Nose) ETHMOIDECTOMY (N/A Nose)  Patient Location: PACU  Anesthesia Type:General  Level of Consciousness: drowsy, patient cooperative and responds to stimulation  Airway & Oxygen Therapy: Patient Spontanous Breathing  Post-op Assessment: Report given to RN and Post -op Vital signs reviewed and stable  Post vital signs: Reviewed and stable  Last Vitals:  Vitals Value Taken Time  BP 146/102 10/09/2018  1:13 PM  Temp    Pulse 113 10/09/2018  1:24 PM  Resp 25 10/09/2018  1:24 PM  SpO2 96 % 10/09/2018  1:24 PM  Vitals shown include unvalidated device data.  Last Pain:  Vitals:   10/09/18 0654  TempSrc:   PainSc: 0-No pain      Patients Stated Pain Goal: 3 (10/09/18 0654)  Complications: No apparent anesthesia complications

## 2018-10-09 NOTE — Anesthesia Procedure Notes (Signed)
Procedure Name: Intubation Date/Time: 10/09/2018 7:43 AM Performed by: Tillman Abide, CRNA Pre-anesthesia Checklist: Patient identified, Emergency Drugs available, Suction available and Patient being monitored Patient Re-evaluated:Patient Re-evaluated prior to induction Oxygen Delivery Method: Circle System Utilized Preoxygenation: Pre-oxygenation with 100% oxygen Induction Type: IV induction Ventilation: Mask ventilation without difficulty Laryngoscope Size: Miller Grade View: Grade I Tube type: Oral Tube size: 7.0 mm Number of attempts: 1 Airway Equipment and Method: Stylet and Oral airway Placement Confirmation: ETT inserted through vocal cords under direct vision,  positive ETCO2 and breath sounds checked- equal and bilateral Secured at: 22 cm Tube secured with: Tape Dental Injury: Teeth and Oropharynx as per pre-operative assessment

## 2018-10-09 NOTE — Op Note (Signed)
OPERATIVE REPORT  DATE OF SURGERY: 10/09/2018  PATIENT:  Anna Morrison,  32 y.o. female  PRE-OPERATIVE DIAGNOSIS:  Nasofrontal encephalocele  POST-OPERATIVE DIAGNOSIS:  Nasofrontal encephalocele  PROCEDURE:  Procedure(s): Endoscopic endonasal repair of ethmoidal encephalocele NASAL ENDOSCOPY ,REPAIR OF FRONTAL ENCEPHALOCELE,HARVEST OF SEPTO BONE/CARTLEDGE ETHMOIDECTOMY  SURGEON:  Surgeon(s) and Role: Ostergard, Clovis Pu, MD - Co-surgeon [name], MD - Co-surgeon   ASSISTANTS:   ANESTHESIA:   General   EBL:  200 ml  DRAINS: none  LOCAL MEDICATIONS USED: 1% Xylocaine with epinephrine  SPECIMEN: Right nasal and sinus contents  COUNTS:  Correct  PROCEDURE DETAILS: The patient was taken to the operating room and placed on the operating table in the supine position. Following induction of general endotracheal anesthesia, the face was draped in the standard fashion.  Afrin spray was used preoperatively in the nasal cavities.  The 0 degree nasal endoscope was used to infiltrate local anesthetic solution.  This was infiltrated into the upper septum, the posterior and superior attachments of the right middle turbinate, and the right lateral nasal wall.  The uncinate process was removed using a sickle knife.  This exposed the bulla.  There is a large soft tissue mass filling the infundibulum.  The middle turbinate was medially displaced because of this and was very thin.  At initially it was not clear if the soft tissue mass was all encephalocele.  Further palpation revealed that there did appear to be some edematous mucosa and this was removed using up-biting while forceps.  The microdebrider was then used to open multiple ethmoid cells.  The posterior cells were opened completely up to the fovea.  Anterior cells were nearly completely replaced by the soft tissue mass.  Right maxillary antrostomy.  In order to facilitate anatomic exposure, a right maxillary antrostomy was  performed using a curved suction and a 30 degree endoscope.  Backbiting forceps were used to enlarge the antrostomy anteriorly and through cut forceps were used posteriorly.  At this point there is adequate exposure of the ethmoid sinus.  Dr. Johnsie Cancel then performed his part of the procedure isolating the encephalocele, cauterizing it with bipolar cautery and reducing it into the intracranial cavity.  He was then able to place a dural graft and middle turbinate bone as an underlay graft.  Initially attempted to expose the middle turbinate bone to harvest as a bone graft and then to use the mucosal graft as a layer of closure of the defect but because the turbinate was so thinned I was only able to resect the turbinate all the way up to the fovea.  Once it was resected I was able to harvest a small piece of bone that was then used by Dr. Johnsie Cancel.  Due to the inability to use the turbinate flap I was able to use a nasal septal mucosal flap.  A needlepoint cautery was used to incise the septal mucosa and a you configuration with the superior flap remaining attached.  The flap was then elevated off the septal bone using a caudal elevator.  This was then rotated and placed into position to cover the entire skull base defect.  This was supported using a neurosurgical sealant.  Right endoscopic frontal sinusotomy.  After the bone graft was placed into the defect attention was turned to the frontal duct.  Just anterior to the foveal dehiscence was the frontal sinus outflow tract.  This was probed using a 30 degree scope and curved suction.  The frontal sinus was identified.  It was free of disease at this point.  I was able to roll up thin Silastic sheeting and placed into the frontal sinus.  After all of the work was done, a piece of NasalPore dressing was placed up to support the septal mucosal graft and then a long flat straight Merocel packing was placed and inflated with local anesthetic solution.  The  pharynx was suctioned of blood and secretions.  The patient was awakened extubated and transferred to recovery in stable condition.    PATIENT DISPOSITION:  To PACU, stable

## 2018-10-09 NOTE — Interval H&P Note (Signed)
History and Physical Interval Note:  10/09/2018 7:16 AM  Anna Morrison  has presented today for surgery, with the diagnosis of Nasofrontal encephalocele  The various methods of treatment have been discussed with the patient and family. After consideration of risks, benefits and other options for treatment, the patient has consented to  Procedure(s) with comments: Endoscopic endonasal repair of ethmoidal encephalocele (N/A) - Endoscopic endonasal repair of ethmoidal encephalocele NASAL ENDOSCOPY ,REPAIR OF FRONTAL ENCEPHALOCELE,HARVEST OF SEPTO BONE/CARTLEDGE (N/A) ETHMOIDECTOMY (N/A) as a surgical intervention .  The patient's history has been reviewed, patient examined, no change in status, stable for surgery.  I have reviewed the patient's chart and labs.  Questions were answered to the patient's satisfaction.     [name]

## 2018-10-10 ENCOUNTER — Encounter (HOSPITAL_COMMUNITY): Payer: Self-pay | Admitting: Neurological Surgery

## 2018-10-10 MED ORDER — CLINDAMYCIN HCL 300 MG PO CAPS
300.0000 mg | ORAL_CAPSULE | Freq: Three times a day (TID) | ORAL | Status: DC
  Administered 2018-10-10: 300 mg via ORAL
  Filled 2018-10-10 (×2): qty 1

## 2018-10-10 MED ORDER — CLINDAMYCIN HCL 300 MG PO CAPS: 300.0000 mg | ORAL_CAPSULE | Freq: Three times a day (TID) | ORAL | 0 refills | Status: AC

## 2018-10-10 MED ORDER — OXYCODONE-ACETAMINOPHEN 5-325 MG PO TABS: 1.0000 | ORAL_TABLET | ORAL | 0 refills | Status: DC | PRN

## 2018-10-10 NOTE — Progress Notes (Signed)
Pt being discharged home. Pt given AVS and went over prescriptions . Pt and mom verbalized understanding. Pt vitals stable. Pt nor family has any concerns at this time all questions answered. Pt escorted out via staff. Pt taken home by mother.

## 2018-10-10 NOTE — Progress Notes (Signed)
Neurosurgery Service Progress Note  Subjective: No acute events overnight, nasal fullness, minimal drainage   Objective: Vitals:   10/09/18 2022 10/09/18 2300 10/10/18 0458 10/10/18 0814  BP: 126/79 119/78 132/85   Pulse: (!) 114 (!) 114 (!) 106   Resp: 20 14    Temp: 99 F (37.2 C) 99 F (37.2 C) 98.5 F (36.9 C) 98.9 F (37.2 C)  TempSrc: Oral Oral Oral   SpO2: 97% 97% 99%   Weight:      Height:       Temp (24hrs), Avg:98.5 F (36.9 C), Min:97.6 F (36.4 C), Max:99 F (37.2 C)  CBC Latest Ref Rng & Units 10/02/2018 07/23/2018 05/22/2018  WBC 4.0 - 10.5 K/uL 10.5 - 12.6(H)  Hemoglobin 12.0 - 15.0 g/dL 10.7(L) 12.6 8.8(L)  Hematocrit 36.0 - 46.0 % 39.5 37.0 34.0(L)  Platelets 150 - 400 K/uL 461(H) - 469(H)   BMP Latest Ref Rng & Units 10/02/2018 07/23/2018 05/22/2018  Glucose 70 - 99 mg/dL 89 85 409(W)  BUN 6 - 20 mg/dL 7 9 6   Creatinine 0.44 - 1.00 mg/dL 1.19 1.47 8.29  Sodium 135 - 145 mmol/L 139 140 139  Potassium 3.5 - 5.1 mmol/L 3.8 4.4 3.6  Chloride 98 - 111 mmol/L 104 105 104  CO2 22 - 32 mmol/L 27 - 26  Calcium 8.9 - 10.3 mg/dL 9.8 - 9.0    Intake/Output Summary (Last 24 hours) at 10/10/2018 0950 Last data filed at 10/10/2018 5621 Gross per 24 hour  Intake 1220 ml  Output 2475 ml  Net -1255 ml    Current Facility-Administered Medications:  .  acetaminophen (TYLENOL) tablet 650 mg, 650 mg, Oral, Q4H PRN **OR** acetaminophen (TYLENOL) suppository 650 mg, 650 mg, Rectal, Q4H PRN, Ostergard, Thomas A, MD .  docusate sodium (COLACE) capsule 100 mg, 100 mg, Oral, BID, Jadene Pierini, MD, 100 mg at 10/10/18 0908 .  famotidine (PEPCID) tablet 10 mg, 10 mg, Oral, Daily, Jadene Pierini, MD, 10 mg at 10/10/18 0908 .  HYDROcodone-acetaminophen (NORCO/VICODIN) 5-325 MG per tablet 1 tablet, 1 tablet, Oral, Q4H PRN, Ostergard, Thomas A, MD .  HYDROmorphone (DILAUDID) injection 0.5 mg, 0.5 mg, Intravenous, Q3H PRN, Jadene Pierini, MD .  loratadine (CLARITIN)  tablet 10 mg, 10 mg, Oral, Daily, Ostergard, Clovis Pu, MD, 10 mg at 10/10/18 0908 .  ondansetron (ZOFRAN) tablet 4 mg, 4 mg, Oral, Q4H PRN **OR** ondansetron (ZOFRAN) injection 4 mg, 4 mg, Intravenous, Q4H PRN, Ostergard, Clovis Pu, MD .  oxyCODONE-acetaminophen (PERCOCET/ROXICET) 5-325 MG per tablet 1 tablet, 1 tablet, Oral, Q4H PRN, Ostergard, Clovis Pu, MD .  promethazine (PHENERGAN) tablet 12.5-25 mg, 12.5-25 mg, Oral, Q4H PRN, Jadene Pierini, MD   Physical Exam: AOx3, PERRL, EOMI, FS, TM, Strength 5/5 x4, SILTx4, no drift  Some dried blood on dressing, no drainage this morning on rounds  Assessment & Plan: 32 y.o. woman s/p repair of ethmoidal encephalocele, recovering well. -okay for discharge home today if she ambulates -f/u Monday w/ Dr. Pollyann Kennedy for removal of packs, clindamycin while packs are in -activity as tolerated, regular diet -SCDs, TEDs, no SQH until POD2 if she remains inpatient  Jadene Pierini  10/10/18 9:50 AM

## 2018-10-10 NOTE — Plan of Care (Signed)
  Problem: Education: Goal: Knowledge of General Education information will improve Description Including pain rating scale, medication(s)/side effects and non-pharmacologic comfort measures Outcome: Progressing   

## 2018-10-10 NOTE — Discharge Summary (Signed)
Discharge Summary  Date of Admission: 10/09/2018  Date of Discharge: 10/10/18  Attending Physician: Autumn Patty, MD  Hospital Course: Patient was admitted following an uncomplicated endoscopic endonasal repair of an ethmoidal encephalocele in a joint case with me and Dr. Pollyann Kennedy. She was recovered in PACU and transferred to stepdown for neurologic monitoring. Her hospital course was uncomplicated and the patient was discharged home on 10/10/18. She will follow up in clinic with me in 2 weeks and with Dr. Pollyann Kennedy on 10/16/18 for removal of her packs.  Neurologic exam at discharge:  AOx3, PERRL, EOMI, FS, TM Strength 5/5 x4, SILTx4, no drift  Jadene Pierini, MD 10/10/18 12:53 PM

## 2018-10-10 NOTE — Discharge Instructions (Signed)
Discharge Instructions  No restriction in activities, slowly increase your activity back to normal. Okay to shower on the day of discharge.   Your nose has packing in it to help with surgical healing. Dr. Pollyann Kennedy will remove the packing in the office on Monday 10/16/18. Follow up with Dr. Maurice Small in 2-3 weeks. If you do not already have a post-operative visit scheduled with Dr. Maurice Small, please call his office at [phone] to schedule one.   Call the office if you are having any clear drainage from your nose or if you have any concerns or questions.

## 2018-10-10 NOTE — Progress Notes (Signed)
Patient ID: Anna Morrison, female   DOB: February 08, 1986, 32 y.o.   MRN: [id number] POD 1  Awake and alert. Packing in place, no bleeding.  Stable post op. Follow up Monday in office for packing removal.

## 2018-10-11 NOTE — Anesthesia Postprocedure Evaluation (Signed)
Anesthesia Post Note  Patient: Anna Morrison  Procedure(s) Performed: Endoscopic endonasal repair of ethmoidal encephalocele (N/A Nose) NASAL ENDOSCOPY ,REPAIR OF FRONTAL ENCEPHALOCELE,HARVEST OF SEPTO BONE/CARTLEDGE (N/A Nose) ETHMOIDECTOMY (N/A Nose)     Patient location during evaluation: PACU Anesthesia Type: General Level of consciousness: awake and alert Pain management: pain level controlled Vital Signs Assessment: post-procedure vital signs reviewed and stable Respiratory status: spontaneous breathing, nonlabored ventilation, respiratory function stable and patient connected to nasal cannula oxygen Cardiovascular status: blood pressure returned to baseline and stable Postop Assessment: no apparent nausea or vomiting Anesthetic complications: no    Last Vitals:  Vitals:   10/10/18 1142 10/10/18 1611  BP: 117/70 127/81  Pulse: 100 99  Resp:    Temp: 37 C 36.8 C  SpO2: 100%     Last Pain:  Vitals:   10/10/18 1611  TempSrc: Oral  PainSc:                  Kalasia Crafton

## 2019-01-02 ENCOUNTER — Ambulatory Visit: Payer: Medicaid Other | Admitting: Gastroenterology

## 2019-02-04 ENCOUNTER — Encounter (HOSPITAL_COMMUNITY): Payer: Self-pay | Admitting: Emergency Medicine

## 2019-02-04 ENCOUNTER — Ambulatory Visit (HOSPITAL_COMMUNITY)
Admission: EM | Admit: 2019-02-04 | Discharge: 2019-02-04 | Disposition: A | Discharge status: Home / Self Care | Payer: Managed Care, Other (non HMO) | Attending: Family Medicine | Admitting: Family Medicine

## 2019-02-04 DIAGNOSIS — J020 Streptococcal pharyngitis: Secondary | ICD-10-CM

## 2019-02-04 LAB — POCT RAPID STREP A: Streptococcus, Group A Screen (Direct): POSITIVE — AB

## 2019-02-04 MED ORDER — DEXAMETHASONE 10 MG/ML FOR PEDIATRIC ORAL USE
INTRAMUSCULAR | Status: AC
  Filled 2019-02-04: qty 1

## 2019-02-04 MED ORDER — PENICILLIN V POTASSIUM 500 MG PO TABS: 500.0000 mg | ORAL_TABLET | Freq: Two times a day (BID) | ORAL | 0 refills | Status: AC

## 2019-02-04 MED ORDER — DEXAMETHASONE SODIUM PHOSPHATE 10 MG/ML IJ SOLN
INTRAMUSCULAR | Status: AC
  Filled 2019-02-04: qty 1

## 2019-02-04 MED ORDER — ACETAMINOPHEN 325 MG PO TABS
650.0000 mg | ORAL_TABLET | Freq: Once | ORAL | Status: AC
  Administered 2019-02-04: 650 mg via ORAL

## 2019-02-04 MED ORDER — DEXAMETHASONE 1 MG/ML PO CONC
10.0000 mg | Freq: Once | ORAL | Status: AC
  Administered 2019-02-04: 10 mg via ORAL

## 2019-02-04 MED ORDER — ACETAMINOPHEN 325 MG PO TABS
ORAL_TABLET | ORAL | Status: AC
  Filled 2019-02-04: qty 2

## 2019-02-04 NOTE — Discharge Instructions (Addendum)
Take 10 full days of penicillin Take 2 doses today Drink lots of water Run a humidifier if you have one You may take an over-the-counter cold pill in addition if you need to Return if not improving in 2 to 3 days

## 2019-02-04 NOTE — ED Provider Notes (Signed)
[facility]    CSN: [id number] Arrival date & time: 02/04/19  1014     History   Chief Complaint Chief Complaint  Patient presents with  . Sore Throat    HPI Anna Morrison is a 33 y.o. female.   HPI  Sore throat and cold for a week.  Runny stuffy nose.  Works at a daycare.  Exposed to "everything".  She has had some chills and body aches.  She has not taken her temperature.  Past Medical History:  Diagnosis Date  . Anemia    low iron  . Chronic constipation   . GERD (gastroesophageal reflux disease)   . Headache   . Nasofrontal encephalocele (HCC)    hx/notes 10/09/2018  . Obesity   . Pneumonia   . Trichomonal vaginitis   . Vaginal yeast infection     Patient Active Problem List   Diagnosis Date Noted  . 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 COLONOSCOPY WITH PROPOFOL N/A 05/30/2018   Procedure: COLONOSCOPY WITH PROPOFOL;  Surgeon: Lynann BolognaGupta, Rajesh, MD;  Location: [facility];  Service: Endoscopy;  Laterality: N/A;  . Endoscopic endonasal repair of ethmoidal encephalocele  10/09/2018  . ETHMOIDECTOMY N/A 10/09/2018   Procedure: ETHMOIDECTOMY;  Surgeon: Serena Colonelosen, Jefry, MD;  [facility];  Service: ENT;  Laterality: N/A;  . NASAL SINUS SURGERY N/A 10/09/2018   Procedure: NASAL ENDOSCOPY ,REPAIR OF FRONTAL ENCEPHALOCELE,HARVEST OF SEPTO BONE/CARTLEDGE;  Surgeon: Serena Colonelosen, Jefry, MD;  [facility];  Service: ENT;  Laterality: N/A;  . REPAIR OF CEREBROSPINAL FLUID LEAK N/A 10/09/2018   Procedure: Endoscopic endonasal repair of ethmoidal encephalocele;  Surgeon: Jadene Pierinistergard, Thomas A, MD;  Location[facility];  Service: Neurosurgery;  Laterality: N/A;    OB History   No obstetric history on file.      Home Medications    Prior to Admission medications   Medication Sig Start Date End Date Taking? Authorizing Provider  acetaminophen (TYLENOL) 500 MG tablet Take 1,000 mg by mouth every 6 (six)  hours as needed for moderate pain or headache.    [provider]  Ascorbic Acid (VITAMIN C PO) Take 1 tablet by mouth daily.    [provider]  cetirizine (ZYRTEC) 10 MG tablet Take 10 mg by mouth daily as needed for allergies.    [provider]  docusate sodium (COLACE) 100 MG capsule Take 1 capsule (100 mg total) by mouth 2 (two) times daily. 05/30/18 05/30/19  Lynann BolognaGupta, Rajesh, MD  IRON PO Take 1 tablet by mouth daily.    [provider]  penicillin v potassium (VEETID) 500 MG tablet Take 1 tablet (500 mg total) by mouth 2 (two) times daily for 10 days. 02/04/19 02/14/19  Eustace MooreNelson, Nalleli Largent Sue, MD    Family History Family History  Problem Relation Age of Onset  . Healthy Mother     Social History Social History   Tobacco Use  . Smoking status: Never Smoker  . Smokeless tobacco: Never Used  Substance Use Topics  . Alcohol use: Yes    Comment: 10/09/2018 "couple glasses of wine/month"  . Drug use: Never     Allergies   Patient has no known allergies.   Review of Systems Review of Systems  Constitutional: Positive for chills and fever.  HENT: Positive for congestion, sore throat and trouble swallowing. Negative for ear pain.   Eyes: Negative for pain and visual disturbance.  Respiratory: Negative for cough and  shortness of breath.   Cardiovascular: Negative for chest pain and palpitations.  Gastrointestinal: Negative for abdominal pain and vomiting.  Genitourinary: Negative for dysuria and hematuria.  Musculoskeletal: Positive for myalgias. Negative for arthralgias and back pain.  Skin: Negative for color change and rash.  Neurological: Negative for seizures and syncope.  All other systems reviewed and are negative.    Physical Exam Triage Vital Signs ED Triage Vitals  Enc Vitals Group     BP 02/04/19 1114 120/70     Pulse Rate 02/04/19 1114 (!) 114     Resp 02/04/19 1114 18     Temp 02/04/19 1114 (!) 101.6 F (38.7 C)     Temp src  --      SpO2 02/04/19 1114 100 %     Weight --      Height --      Head Circumference --      Peak Flow --      Pain Score 02/04/19 1115 9     Pain Loc --      Pain Edu? --      Excl. in GC? --    No data found.  Updated Vital Signs BP 120/70   Pulse (!) 114   Temp (!) 101.6 F (38.7 C)   Resp 18   SpO2 100%   Visual Acuity Right Eye Distance:   Left Eye Distance:   Bilateral Distance:    Right Eye Near:   Left Eye Near:    Bilateral Near:     Physical Exam Constitutional:      General: She is not in acute distress.    Appearance: She is well-developed. She is obese. She is ill-appearing.  HENT:     Head: Normocephalic and atraumatic.     Right Ear: Tympanic membrane and ear canal normal.     Left Ear: Tympanic membrane and ear canal normal.     Nose: Congestion and rhinorrhea present.     Mouth/Throat:     Mouth: Mucous membranes are moist.     Pharynx: Posterior oropharyngeal erythema present.     Tonsils: Tonsillar exudate present. Swelling: 2+ on the right. 2+ on the left.  Eyes:     Conjunctiva/sclera: Conjunctivae normal.     Pupils: Pupils are equal, round, and reactive to light.  Neck:     Musculoskeletal: Normal range of motion.  Cardiovascular:     Rate and Rhythm: Normal rate and regular rhythm.  Pulmonary:     Effort: Pulmonary effort is normal. No respiratory distress.     Breath sounds: Normal breath sounds.  Abdominal:     General: There is no distension.     Palpations: Abdomen is soft.  Musculoskeletal: Normal range of motion.  Lymphadenopathy:     Cervical: Cervical adenopathy present.  Skin:    General: Skin is warm and dry.  Neurological:     General: No focal deficit present.     Mental Status: She is alert.  Psychiatric:        Mood and Affect: Mood normal.      UC Treatments / Results  Labs (all labs ordered are listed, but only abnormal results are displayed) Labs Reviewed  POCT RAPID STREP A - Abnormal; Notable for the  following components:      Result Value   Streptococcus, Group A Screen (Direct) POSITIVE (*)    All other components within normal limits    EKG None  Radiology No results found.  Procedures Procedures (  including critical care time)  Medications Ordered in UC Medications  acetaminophen (TYLENOL) tablet 650 mg (650 mg Oral Given 02/04/19 1119)  dexamethasone (DECADRON) 1 MG/ML solution 10 mg (10 mg Oral Given 02/04/19 1220)    Initial Impression / Assessment and Plan / UC Course  I have reviewed the triage vital signs and the nursing notes.  Pertinent labs & imaging results that were available during my care of the patient were reviewed by me and considered in my medical decision making (see chart for details).     Discussed strep throat.  Importance of taking 10 full days of antibiotics.  Contagiousness.  Stay out of workFor couple of days, especially until fever is gone. Final Clinical Impressions(s) / UC Diagnoses   Final diagnoses:  Strep throat     Discharge Instructions     Take 10 full days of penicillin Take 2 doses today Drink lots of water Run a humidifier if you have one You may take an over-the-counter cold pill in addition if you need to Return if not improving in 2 to 3 days   ED Prescriptions    Medication Sig Dispense Auth. Provider   penicillin v potassium (VEETID) 500 MG tablet Take 1 tablet (500 mg total) by mouth 2 (two) times daily for 10 days. 20 tablet Eustace Moore, MD     Controlled Substance Prescriptions [place] Controlled Substance Registry consulted? Not Applicable   Eustace Moore, MD 02/04/19 2055

## 2019-02-04 NOTE — ED Triage Notes (Signed)
Pt c/o sore throat, chills, body aches, congestion for several days.

## 2019-05-29 IMAGING — DX DG CHEST 2V
2 series · 2 of 2 positions shown · non-contrast
Comparison: None.

CLINICAL DATA: Fever for 2 days.  Productive cough.

EXAM:
CHEST  2 VIEW

[w chest lat]
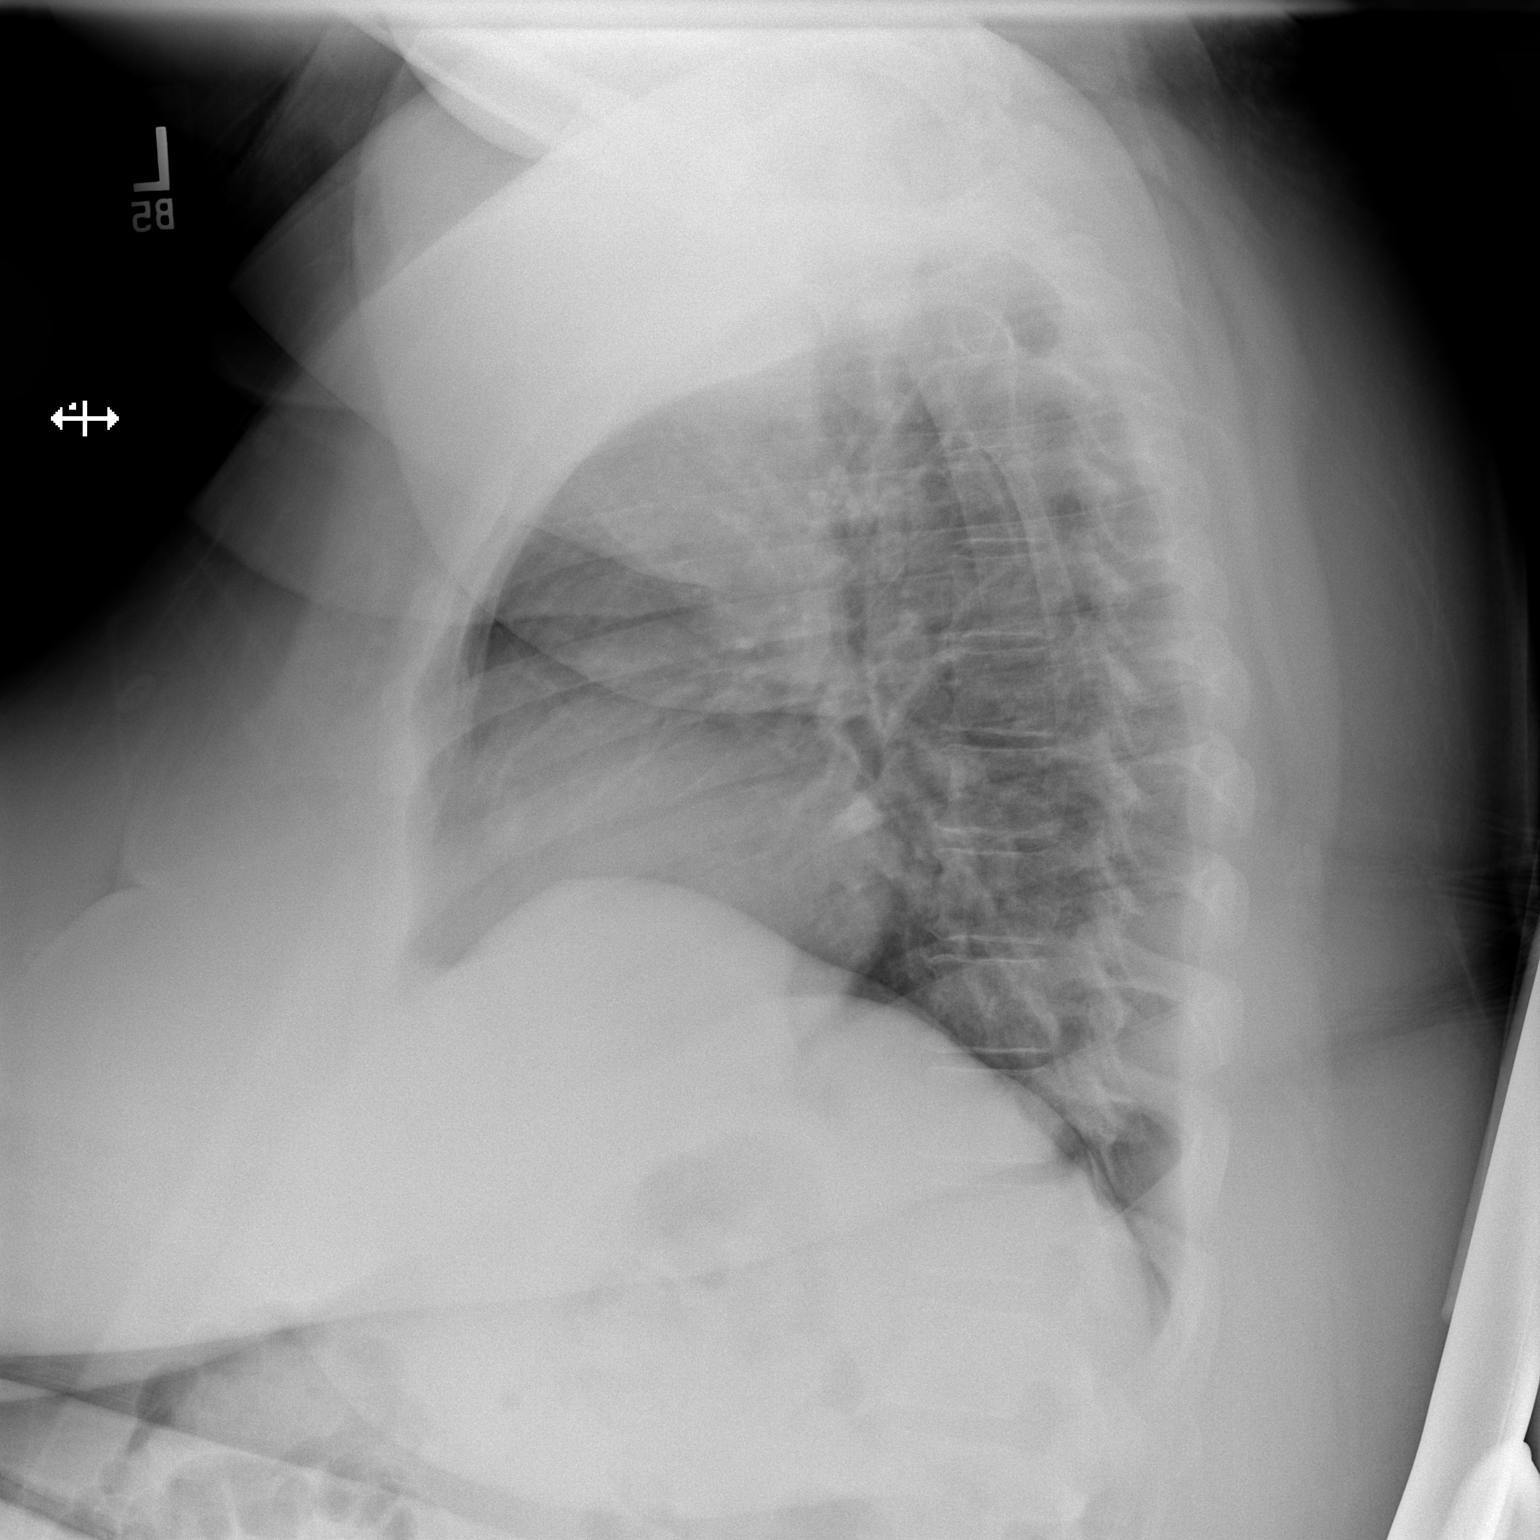

[x chest ap]
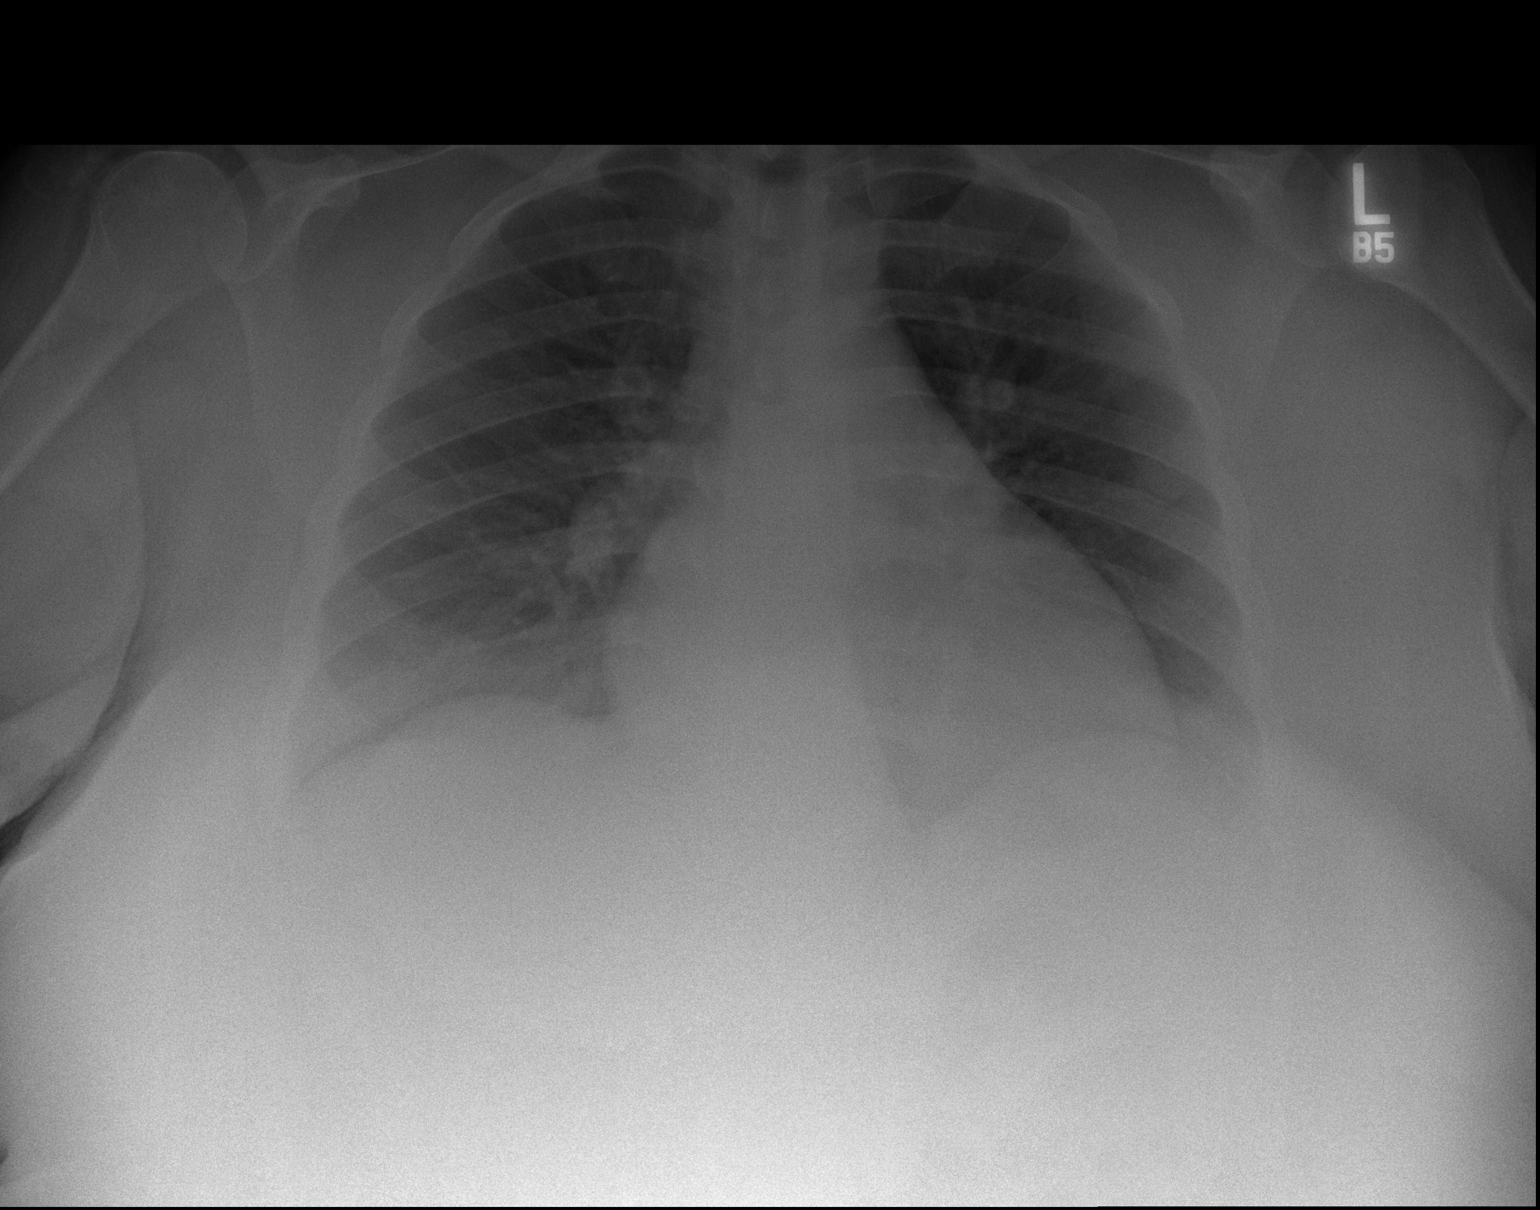

[2 of 2 positions shown; findings below may reference images not displayed]

FINDINGS: The heart size and mediastinal contours are within normal limits.
Both lungs are clear. The visualized skeletal structures are
unremarkable.
IMPRESSION: No active cardiopulmonary disease.

## 2019-08-11 IMAGING — CT CT HEAD W/O CM
3 series · 15 of 47 positions shown, 18 images · non-contrast
Comparison: None.

CLINICAL DATA: Headache and dizziness

EXAM:
CT HEAD WITHOUT CONTRAST
TECHNIQUE: Contiguous axial images were obtained from the base of the skull
through the vertex without intravenous contrast.

[Series 3: head 5.0 h30s · axial · 0.46mm/px · z∈[-123,+27]mm · 9 of 36 slices shown, 12 images]
[im 3/36  brain]
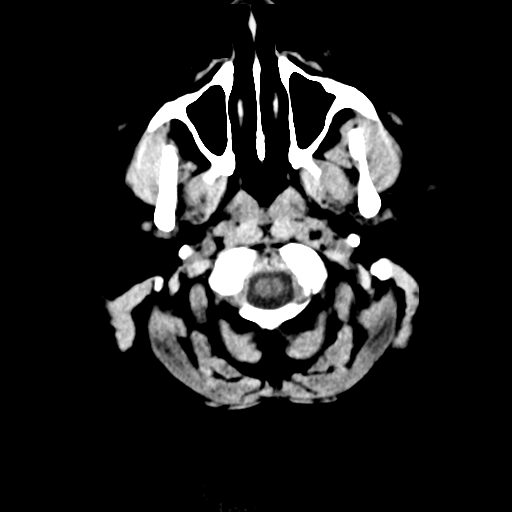
[im 3/36  bone]
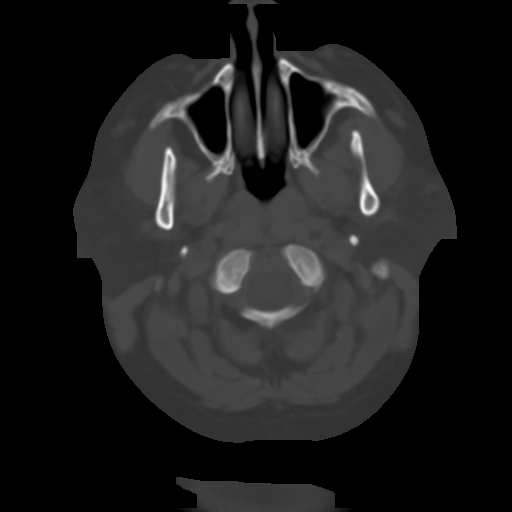
[im 7/36  brain]
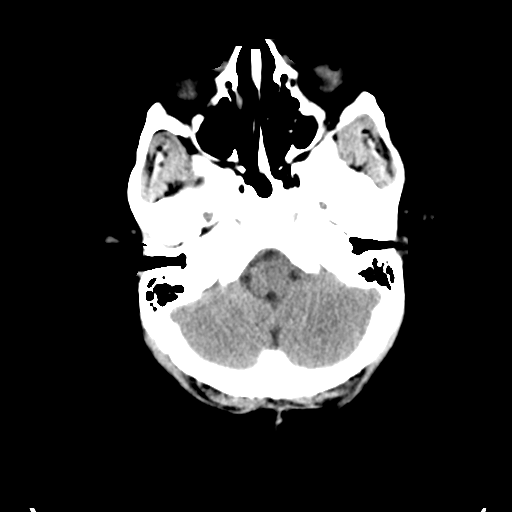
[im 10/36  brain]
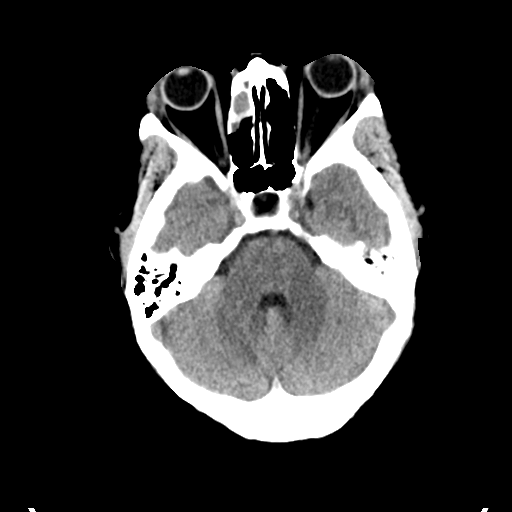
[im 14/36  brain]
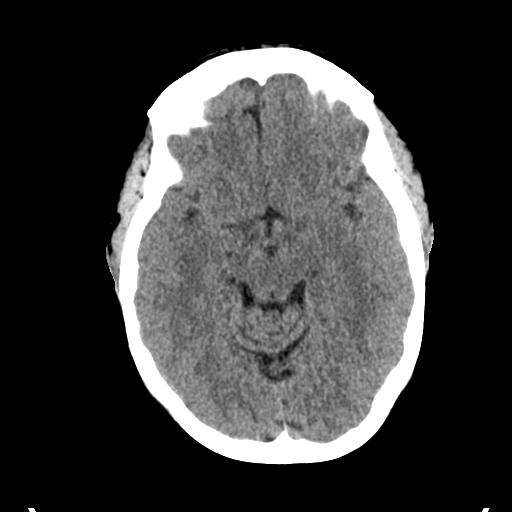
[im 19/36  brain]
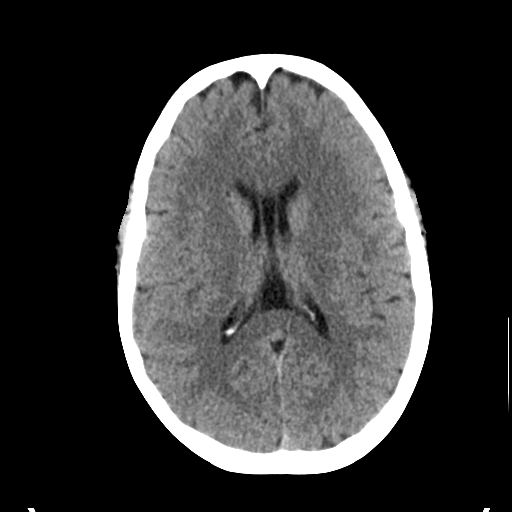
[im 19/36  bone]
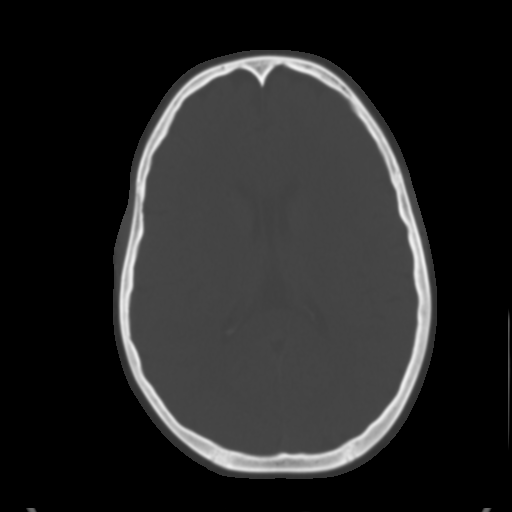
[im 22/36  brain]
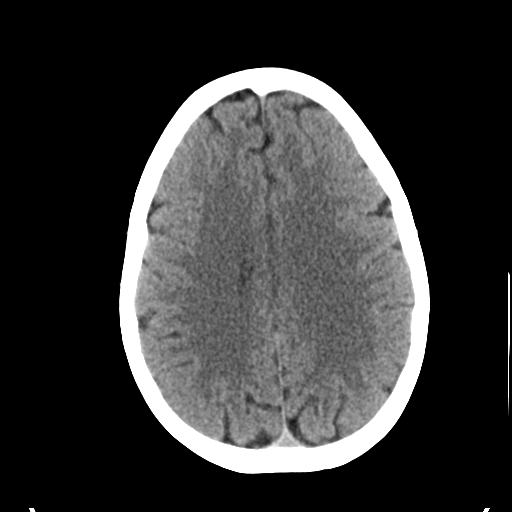
[im 26/36  brain]
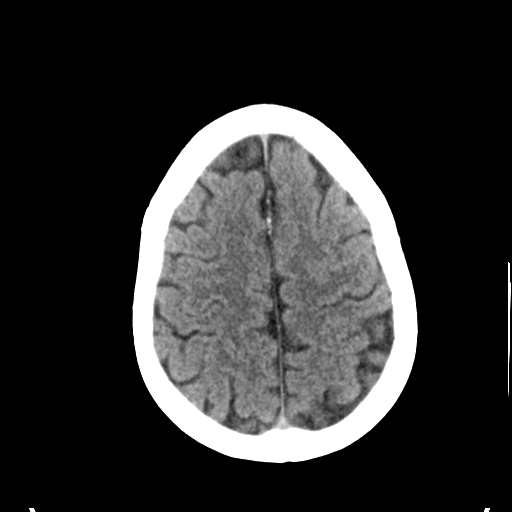
[im 29/36  brain]
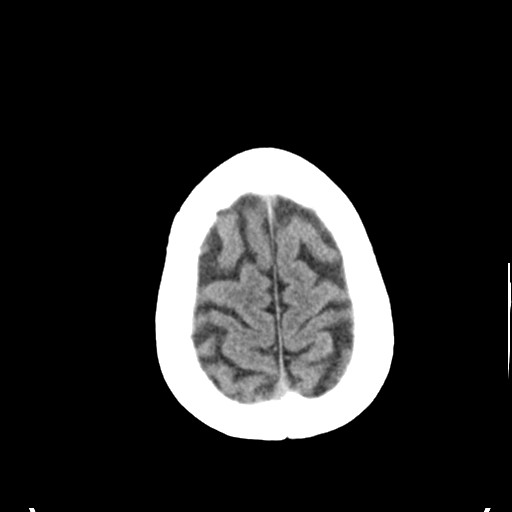
[im 33/36  brain]
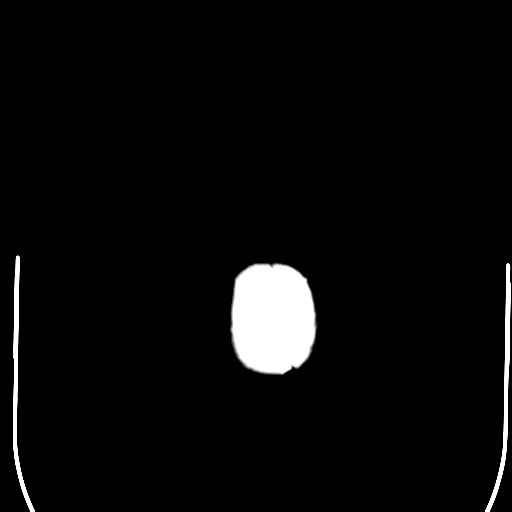
[im 33/36  bone]
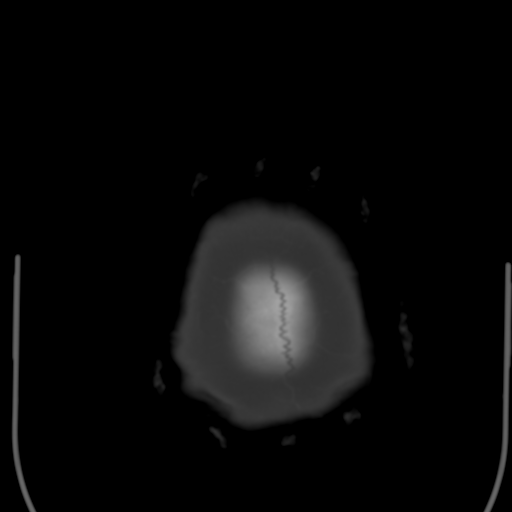

[Series 5: head 3.0 mpr cor · coronal · 0.35mm/px · 3 of 76 slices shown]
[im 26/76  brain]
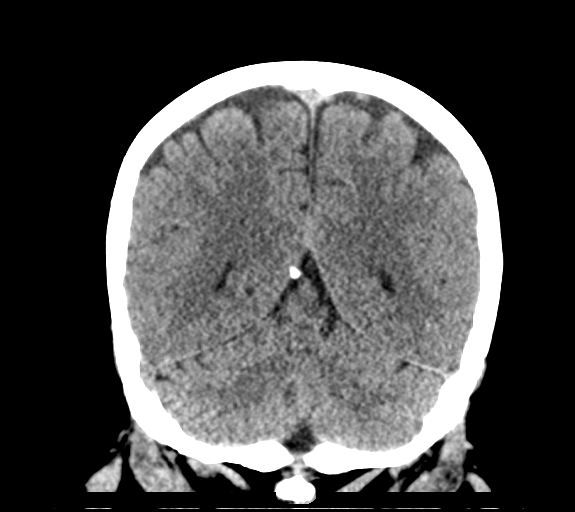
[im 34/76  brain]
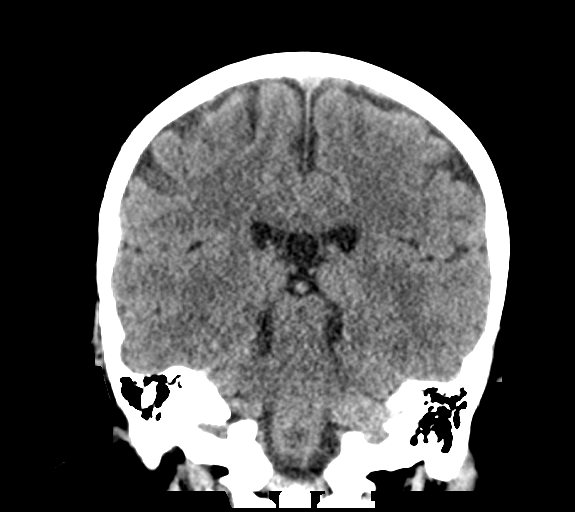
[im 42/76  brain]
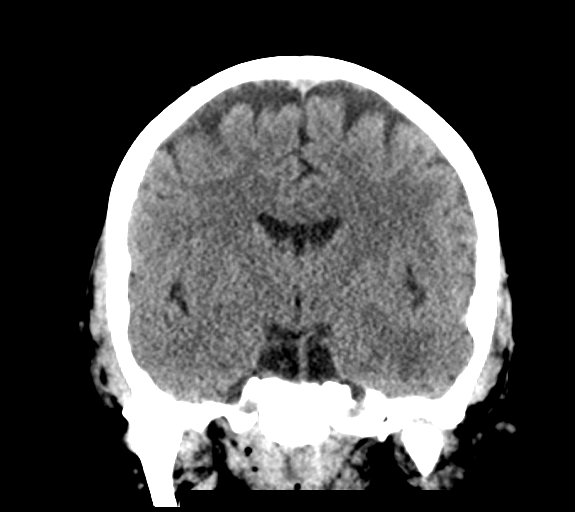

[Series 6: head 3.0 mpr sag · sagittal · 0.35mm/px · 3 of 65 slices shown]
[im 22/65  brain]
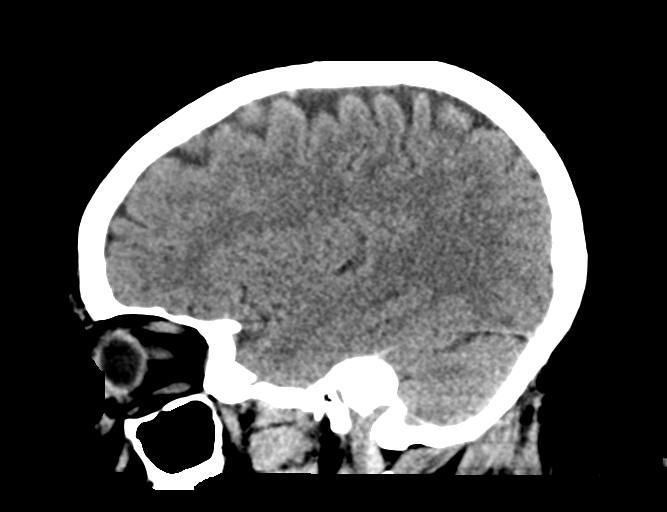
[im 33/65  brain]
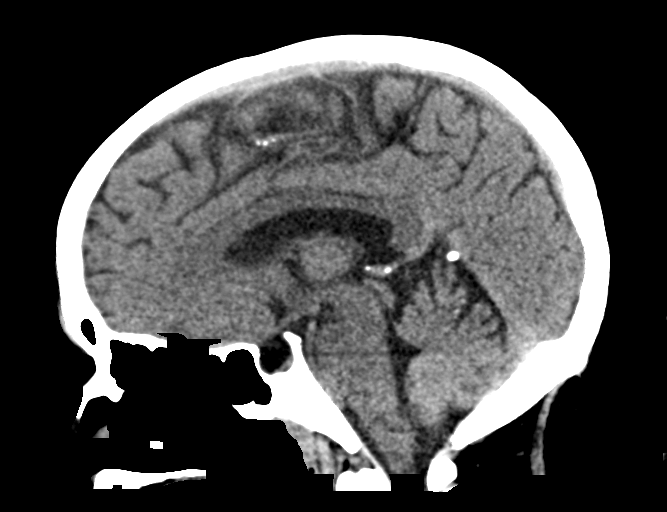
[im 43/65  brain]
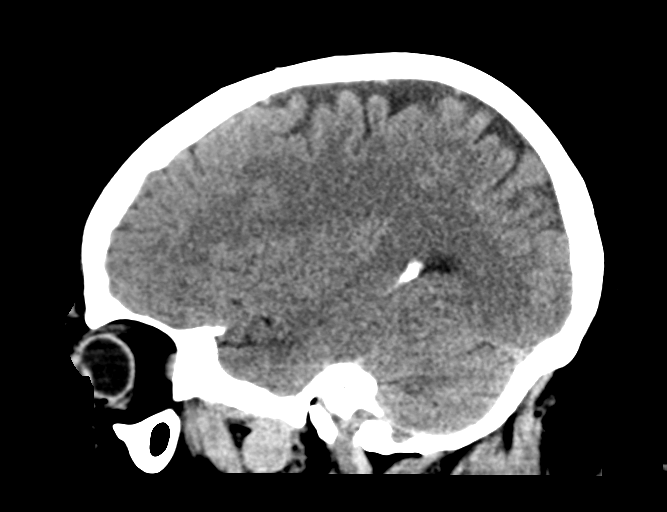

[15 of 47 positions shown; findings below may reference images not displayed]

FINDINGS: Brain: The ventricles are normal in size and configuration. There is
invagination of CSF into the sella. There is a cavum septum
pellucidum, an anatomic variant. There is no intracranial mass,
hemorrhage, extra-axial fluid collection, or midline shift. No focal
gray-white compartment lesions are evident. No evident acute
infarct.

Vascular: No evident hyperdense vessel. There is no appreciable
vascular calcification.

Skull: Bony calvarium appears intact.

Sinuses/Orbits: There is opacification of multiple ethmoid air cells
on the right. There is mild mucosal thickening in the left maxillary
antrum. There is opacification of a hypoplastic right frontal sinus.
Other paranasal sinuses are clear. Orbits appear symmetric
bilaterally.

Other: Mastoid air cells are clear.
IMPRESSION: Areas of paranasal sinus disease, most notably in the right ethmoid
air cell complex. There is invagination of CSF into the sella, a
finding of doubtful clinical significance. Study otherwise
unremarkable.

## 2019-10-17 ENCOUNTER — Other Ambulatory Visit: Payer: Self-pay | Admitting: Neurological Surgery

## 2019-10-23 NOTE — Pre-Procedure Instructions (Signed)
[facility] ([place]), [place] - [address] [place] ([place]) [place] [address] Phone: [phone] Fax: [phone]    Your procedure is scheduled on Wed., Nov. 11, 2020 from 8:30AM-11:20AM  Report to [facility] Entrance "A" at 6:30AM  Call this number if you have problems the morning of surgery:  [phone]   Remember:  Do not eat or drink after midnight on Nov. 10th    Take these medicines the morning of surgery with A SIP OF WATER: NONE  As of today, stop taking all Aspirin (unless instructed by your doctor) and Other Aspirin containing products, Vitamins, Fish oils, and Herbal medications. Also stop all NSAIDS i.e. Advil, Ibuprofen, Motrin, Aleve, Anaprox, Naproxen, BC, Goody Powders, and all Supplements.  No Smoking of any kind, Tobacco, or Alcohol products 24 hours prior to your procedure. If you use a Cpap at night, you may bring all equipment for your overnight stay.   Special instructions:   [facility]- Preparing For Surgery  Before surgery, you can play an important role. Because skin is not sterile, your skin needs to be as free of germs as possible. You can reduce the number of germs on your skin by washing with CHG (chlorahexidine gluconate) Soap before surgery.  CHG is an antiseptic cleaner which kills germs and bonds with the skin to continue killing germs even after washing.    Please do not use if you have an allergy to CHG or antibacterial soaps. If your skin becomes reddened/irritated stop using the CHG.  Do not shave (including legs and underarms) for at least 48 hours prior to first CHG shower. It is OK to shave your face.  Please follow these instructions carefully.   1. Shower the NIGHT BEFORE SURGERY and the MORNING OF SURGERY with CHG.   2. If you chose to wash your hair, wash your hair first as usual with your normal shampoo.  3. After you shampoo, rinse your hair and body thoroughly to remove  the shampoo.  4. Use CHG as you would any other liquid soap. You can apply CHG directly to the skin and wash gently with a scrungie or a clean washcloth.   5. Apply the CHG Soap to your body ONLY FROM THE NECK DOWN.  Do not use on open wounds or open sores. Avoid contact with your eyes, ears, mouth and genitals (private parts). Wash Face and genitals (private parts)  with your normal soap.  6. Wash thoroughly, paying special attention to the area where your surgery will be performed.  7. Thoroughly rinse your body with warm water from the neck down.  8. DO NOT shower/wash with your normal soap after using and rinsing off the CHG Soap.  9. Pat yourself dry with a CLEAN TOWEL.  10. Wear CLEAN PAJAMAS to bed the night before surgery, wear comfortable clothes the morning of surgery  11. Place CLEAN SHEETS on your bed the night of your first shower and DO NOT SLEEP WITH PETS.   Day of Surgery:            Remember to brush your teeth WITH YOUR REGULAR TOOTHPASTE.  Do not wear jewelry, make-up or nail polish.  Do not wear lotions, powders, or perfumes, or deodorant.  Do not shave 48 hours prior to surgery.   Do not bring valuables to the hospital.  [facility] is not responsible for any belongings or valuables.  Contacts, dentures or bridgework may not be worn  into surgery.   For patients admitted to the hospital, discharge time will be determined by your treatment team.  Patients discharged the day of surgery will not be allowed to drive home, and someone age 31 and over needs to stay with them for 24 hours.  Please wear clean clothes to the hospital/surgery center.    Please read over the following fact sheets that you were given.

## 2019-10-24 ENCOUNTER — Other Ambulatory Visit: Payer: Self-pay

## 2019-10-24 ENCOUNTER — Encounter (HOSPITAL_COMMUNITY)
Admission: RE | Admit: 2019-10-24 | Discharge: 2019-10-24 | Disposition: A | Discharge status: Home / Self Care | Payer: Managed Care, Other (non HMO) | Source: Ambulatory Visit | Attending: Neurological Surgery | Admitting: Neurological Surgery

## 2019-10-24 ENCOUNTER — Encounter (HOSPITAL_COMMUNITY): Payer: Self-pay

## 2019-10-24 DIAGNOSIS — Z01812 Encounter for preprocedural laboratory examination: Secondary | ICD-10-CM | POA: Diagnosis present

## 2019-10-24 LAB — TYPE AND SCREEN
ABO/RH(D): B POS
Antibody Screen: NEGATIVE

## 2019-10-24 LAB — CBC
HCT: 41.9 % (ref 36.0–46.0)
Hemoglobin: 12.2 g/dL (ref 12.0–15.0)
MCH: 24.3 pg — ABNORMAL LOW (ref 26.0–34.0)
MCHC: 29.1 g/dL — ABNORMAL LOW (ref 30.0–36.0)
MCV: 83.3 fL (ref 80.0–100.0)
Platelets: 333 10*3/uL (ref 150–400)
RBC: 5.03 MIL/uL (ref 3.87–5.11)
RDW: 14.1 % (ref 11.5–15.5)
WBC: 7.9 10*3/uL (ref 4.0–10.5)
nRBC: 0 % (ref 0.0–0.2)

## 2019-10-24 NOTE — Progress Notes (Signed)
PCP - [facility] Cardiologist - denies  PPM/ICD - denies Device Orders - N/A Rep Notified - N/A  Chest x-ray - N/A EKG - N/A Stress Test - denies  ECHO - denies Cardiac Cath - denies  Sleep Study - denies CPAP - N/A  Blood Thinner Instructions: N/A Aspirin Instructions: N/A  ERAS Protcol - No PRE-SURGERY Ensure or G2- N/A  COVID TEST- Scheduled for 10/29/2019. Patient verbalized understanding of self-quarantine instructions, appointment time, and place.  Anesthesia review: No  Patient denies shortness of breath, fever, cough and chest pain at PAT appointment  All instructions explained to the patient, with a verbal understanding of the material. Patient agrees to go over the instructions while at home for a better understanding. Patient also instructed to self quarantine after being tested for COVID-19. The opportunity to ask questions was provided.

## 2019-10-25 ENCOUNTER — Other Ambulatory Visit (HOSPITAL_COMMUNITY): Payer: Managed Care, Other (non HMO)

## 2019-10-25 NOTE — H&P (Signed)
Anna Morrison is an 33 y.o. female.   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  Past Medical History:  Diagnosis Date  . Anemia    low iron  . Chronic constipation   . GERD (gastroesophageal reflux disease)   . Headache   . 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 Obesity   . Pneumonia   . Trichomonal vaginitis   . Vaginal yeast infection     Past Surgical History:  Procedure Laterality Date  . COLONOSCOPY WITH PROPOFOL N/A 05/30/2018   Procedure: COLONOSCOPY WITH PROPOFOL;  Surgeon: Jackquline Denmark, MD;  Location: [facility];  Service: Endoscopy;  Laterality: N/A;  . Endoscopic endonasal repair of ethmoidal encephalocele  10/09/2018  . ETHMOIDECTOMY N/A 10/09/2018   Procedure: ETHMOIDECTOMY;  Surgeon: Izora Gala, MD;  Location: [place];  Service: ENT;  Laterality: N/A;  . NASAL SINUS SURGERY N/A 10/09/2018   Procedure: NASAL ENDOSCOPY ,REPAIR OF FRONTAL ENCEPHALOCELE,HARVEST OF SEPTO BONE/CARTLEDGE;  Surgeon: Izora Gala, MD;  Location: [place];  Service: ENT;  Laterality: N/A;  . REPAIR OF CEREBROSPINAL FLUID LEAK N/A 10/09/2018   Procedure: Endoscopic endonasal repair of ethmoidal encephalocele;  Surgeon: Judith Part, MD;  Location: [facility];  Service: Neurosurgery;  Laterality: N/A;    Family History  Problem Relation Age of Onset  . Healthy Mother    Social History:  reports that she has never smoked. She has never used smokeless tobacco. She reports current alcohol use. She reports that she does not use drugs.  Allergies: No Known Allergies  No medications prior to admission.    Results for orders placed or performed during the hospital encounter of 10/24/19 (from the past 48 hour(s))  CBC     Status: Abnormal   Collection Time: 10/24/19  9:43 AM  Result Value Ref Range   WBC 7.9 4.0 - 10.5 K/uL   RBC 5.03 3.87 - 5.11 MIL/uL   Hemoglobin 12.2 12.0  - 15.0 g/dL   HCT 41.9 36.0 - 46.0 %   MCV 83.3 80.0 - 100.0 fL   MCH 24.3 (L) 26.0 - 34.0 pg   MCHC 29.1 (L) 30.0 - 36.0 g/dL   RDW 14.1 11.5 - 15.5 %   Platelets 333 150 - 400 K/uL   nRBC 0.0 0.0 - 0.2 %    Comment: Performed at [facility], [place] [address] [place], [place] [address]  Type and screen     Status: None   Collection Time: 10/24/19  9:50 AM  Result Value Ref Range   ABO/RH(D) B POS    Antibody Screen NEG    Sample Expiration 11/07/2019,2359    Extend sample reason      NO TRANSFUSIONS OR PREGNANCY IN THE PAST 3 MONTHS Performed at [facility], [place] [address] [place], [place] [address]    No results found.  ROS: otherwise negative  Last menstrual period 10/19/2019.  PHYSICAL EXAM: Overall appearance: Obese lady, in no distress Head:  Normocephalic, atraumatic. Ears: External auditory canals are clear; tympanic membranes are intact and the middle ears are free of any effusion. Nose: External nose is healthy in appearance. Internal nasal exam free of any lesions or obstruction.  She is able to produce clear watery discharge. Oral Cavity/pharynx:  There are no mucosal lesions or masses identified. Hypopharynx/Larynx: no signs of any mucosal lesions or masses identified. Vocal cords move normally. Neuro:  No identifiable  neurologic deficits. Neck: No palpable neck masses.  Studies Reviewed: Maxillofacial CT    Assessment/Plan Recurrent CSF rhinorrhea.  Recommend repeat surgery at this time with lumbar drain and additional reconstruction of the anterior cranial fossa floor.  [name] 10/25/2019, 9:19 AM

## 2019-10-29 ENCOUNTER — Other Ambulatory Visit (HOSPITAL_COMMUNITY)
Admission: RE | Admit: 2019-10-29 | Discharge: 2019-10-29 | Disposition: A | Discharge status: Home / Self Care | Payer: Managed Care, Other (non HMO) | Source: Ambulatory Visit | Attending: Neurological Surgery | Admitting: Neurological Surgery

## 2019-10-29 DIAGNOSIS — Z01812 Encounter for preprocedural laboratory examination: Secondary | ICD-10-CM | POA: Insufficient documentation

## 2019-10-29 DIAGNOSIS — Z20828 Contact with and (suspected) exposure to other viral communicable diseases: Secondary | ICD-10-CM | POA: Insufficient documentation

## 2019-10-30 LAB — SARS CORONAVIRUS 2 (TAT 6-24 HRS): SARS Coronavirus 2: NEGATIVE

## 2019-10-30 MED ORDER — DEXTROSE 5 % IV SOLN
3.0000 g | INTRAVENOUS | Status: AC
  Administered 2019-10-31: 3 g via INTRAVENOUS
  Filled 2019-10-30: qty 3000
  Filled 2019-10-30: qty 3

## 2019-10-31 ENCOUNTER — Encounter (HOSPITAL_COMMUNITY)
Admission: RE | Disposition: A | Discharge status: Home / Self Care | Payer: Self-pay | Source: Home / Self Care | Attending: Neurological Surgery

## 2019-10-31 ENCOUNTER — Inpatient Hospital Stay (HOSPITAL_COMMUNITY): Payer: Managed Care, Other (non HMO)

## 2019-10-31 ENCOUNTER — Other Ambulatory Visit: Payer: Self-pay

## 2019-10-31 ENCOUNTER — Encounter (HOSPITAL_COMMUNITY): Payer: Self-pay

## 2019-10-31 ENCOUNTER — Inpatient Hospital Stay (HOSPITAL_COMMUNITY)
Admission: RE | Admit: 2019-10-31 | Discharge: 2019-11-07 | DRG: 026 | Disposition: A | Discharge status: Home / Self Care | Payer: Managed Care, Other (non HMO) | Attending: Neurological Surgery | Admitting: Neurological Surgery

## 2019-10-31 ENCOUNTER — Inpatient Hospital Stay (HOSPITAL_COMMUNITY): Payer: Managed Care, Other (non HMO) | Admitting: Certified Registered Nurse Anesthetist

## 2019-10-31 DIAGNOSIS — K219 Gastro-esophageal reflux disease without esophagitis: Secondary | ICD-10-CM | POA: Diagnosis present

## 2019-10-31 DIAGNOSIS — E669 Obesity, unspecified: Secondary | ICD-10-CM | POA: Diagnosis present

## 2019-10-31 DIAGNOSIS — Q011 Nasofrontal encephalocele: Principal | ICD-10-CM

## 2019-10-31 DIAGNOSIS — Z20828 Contact with and (suspected) exposure to other viral communicable diseases: Secondary | ICD-10-CM | POA: Diagnosis present

## 2019-10-31 DIAGNOSIS — Z6841 Body Mass Index (BMI) 40.0 and over, adult: Secondary | ICD-10-CM

## 2019-10-31 DIAGNOSIS — G9600 Cerebrospinal fluid leak, unspecified: Secondary | ICD-10-CM | POA: Diagnosis present

## 2019-10-31 DIAGNOSIS — G9601 Cranial cerebrospinal fluid leak, spontaneous: Secondary | ICD-10-CM | POA: Diagnosis present

## 2019-10-31 DIAGNOSIS — Z419 Encounter for procedure for purposes other than remedying health state, unspecified: Secondary | ICD-10-CM

## 2019-10-31 HISTORY — PX: REPAIR OF CEREBROSPINAL FLUID LEAK: SHX6322

## 2019-10-31 HISTORY — PX: PLACEMENT OF LUMBAR DRAIN: SHX6028

## 2019-10-31 HISTORY — PX: NASAL SINUS SURGERY: SHX719

## 2019-10-31 LAB — CBC
HCT: 43.3 % (ref 36.0–46.0)
Hemoglobin: 12.8 g/dL (ref 12.0–15.0)
MCH: 24.3 pg — ABNORMAL LOW (ref 26.0–34.0)
MCHC: 29.6 g/dL — ABNORMAL LOW (ref 30.0–36.0)
MCV: 82.3 fL (ref 80.0–100.0)
Platelets: 353 10*3/uL (ref 150–400)
RBC: 5.26 MIL/uL — ABNORMAL HIGH (ref 3.87–5.11)
RDW: 14.1 % (ref 11.5–15.5)
WBC: 14.6 10*3/uL — ABNORMAL HIGH (ref 4.0–10.5)
nRBC: 0 % (ref 0.0–0.2)

## 2019-10-31 LAB — CREATININE, SERUM
Creatinine, Ser: 0.78 mg/dL (ref 0.44–1.00)
GFR calc Af Amer: >60 mL/min (ref >60)
GFR calc non Af Amer: >60 mL/min (ref >60)

## 2019-10-31 LAB — POCT PREGNANCY, URINE: Preg Test, Ur: NEGATIVE

## 2019-10-31 SURGERY — REPAIR OF CEREBROSPINAL FLUID LEAK
Anesthesia: General

## 2019-10-31 SURGERY — SINUS SURGERY, ENDOSCOPIC
Anesthesia: General

## 2019-10-31 MED ORDER — SUCCINYLCHOLINE CHLORIDE 200 MG/10ML IV SOSY
PREFILLED_SYRINGE | INTRAVENOUS | Status: AC
  Filled 2019-10-31: qty 10

## 2019-10-31 MED ORDER — MIDAZOLAM HCL 2 MG/2ML IJ SOLN
INTRAMUSCULAR | Status: AC
  Filled 2019-10-31: qty 2

## 2019-10-31 MED ORDER — FENTANYL CITRATE (PF) 250 MCG/5ML IJ SOLN
INTRAMUSCULAR | Status: AC
  Filled 2019-10-31: qty 5

## 2019-10-31 MED ORDER — DEXAMETHASONE SODIUM PHOSPHATE 10 MG/ML IJ SOLN
INTRAMUSCULAR | Status: DC | PRN
  Administered 2019-10-31: 10 mg via INTRAVENOUS

## 2019-10-31 MED ORDER — ONDANSETRON HCL 4 MG PO TABS: 4.0000 mg | ORAL_TABLET | ORAL | Status: DC | PRN

## 2019-10-31 MED ORDER — PHENYLEPHRINE 40 MCG/ML (10ML) SYRINGE FOR IV PUSH (FOR BLOOD PRESSURE SUPPORT)
PREFILLED_SYRINGE | INTRAVENOUS | Status: AC
  Filled 2019-10-31: qty 10

## 2019-10-31 MED ORDER — ONDANSETRON HCL 4 MG/2ML IJ SOLN: 4.0000 mg | INTRAMUSCULAR | Status: DC | PRN

## 2019-10-31 MED ORDER — HEMOSTATIC AGENTS (NO CHARGE) OPTIME
TOPICAL | Status: DC | PRN
  Administered 2019-10-31: 1 via TOPICAL

## 2019-10-31 MED ORDER — FLUORESCEIN SODIUM 10 % IV SOLN
10.0000 mg | Freq: Once | INTRAVENOUS | Status: DC
  Filled 2019-10-31: qty 5

## 2019-10-31 MED ORDER — PHENYLEPHRINE HCL (PRESSORS) 10 MG/ML IV SOLN
INTRAVENOUS | Status: DC | PRN
  Administered 2019-10-31 (×3): 80 ug via INTRAVENOUS

## 2019-10-31 MED ORDER — CEFAZOLIN SODIUM-DEXTROSE 2-4 GM/100ML-% IV SOLN
2.0000 g | Freq: Three times a day (TID) | INTRAVENOUS | Status: AC
  Administered 2019-10-31 (×2): 2 g via INTRAVENOUS
  Filled 2019-10-31 (×2): qty 100

## 2019-10-31 MED ORDER — DOCUSATE SODIUM 100 MG PO CAPS
100.0000 mg | ORAL_CAPSULE | Freq: Two times a day (BID) | ORAL | Status: DC
  Administered 2019-10-31 – 2019-11-06 (×8): 100 mg via ORAL
  Filled 2019-10-31 (×10): qty 1

## 2019-10-31 MED ORDER — ROCURONIUM BROMIDE 100 MG/10ML IV SOLN
INTRAVENOUS | Status: DC | PRN
  Administered 2019-10-31 (×2): 100 mg via INTRAVENOUS
  Administered 2019-10-31: 50 mg via INTRAVENOUS

## 2019-10-31 MED ORDER — HYDROCODONE-ACETAMINOPHEN 5-325 MG PO TABS
1.0000 | ORAL_TABLET | ORAL | Status: DC | PRN
  Administered 2019-10-31 – 2019-11-05 (×6): 1 via ORAL
  Filled 2019-10-31 (×6): qty 1

## 2019-10-31 MED ORDER — HYDROMORPHONE HCL 1 MG/ML IJ SOLN: 0.2500 mg | INTRAMUSCULAR | Status: DC | PRN

## 2019-10-31 MED ORDER — LIDOCAINE-EPINEPHRINE 1 %-1:100000 IJ SOLN
INTRAMUSCULAR | Status: AC
  Filled 2019-10-31: qty 1

## 2019-10-31 MED ORDER — ROCURONIUM BROMIDE 10 MG/ML (PF) SYRINGE
PREFILLED_SYRINGE | INTRAVENOUS | Status: AC
  Filled 2019-10-31: qty 30

## 2019-10-31 MED ORDER — ONDANSETRON HCL 4 MG/2ML IJ SOLN
INTRAMUSCULAR | Status: DC | PRN
  Administered 2019-10-31: 4 mg via INTRAVENOUS

## 2019-10-31 MED ORDER — SODIUM CHLORIDE 0.9 % IV SOLN
INTRAVENOUS | Status: DC | PRN
  Administered 2019-10-31: 500 mL

## 2019-10-31 MED ORDER — ACETAMINOPHEN 325 MG PO TABS: 650.0000 mg | ORAL_TABLET | ORAL | Status: DC | PRN

## 2019-10-31 MED ORDER — FENTANYL CITRATE (PF) 100 MCG/2ML IJ SOLN: 25.0000 ug | INTRAMUSCULAR | Status: DC | PRN

## 2019-10-31 MED ORDER — FENTANYL CITRATE (PF) 100 MCG/2ML IJ SOLN
INTRAMUSCULAR | Status: DC | PRN
  Administered 2019-10-31 (×7): 50 ug via INTRAVENOUS
  Administered 2019-10-31: 100 ug via INTRAVENOUS
  Administered 2019-10-31: 50 ug via INTRAVENOUS

## 2019-10-31 MED ORDER — DEXAMETHASONE SODIUM PHOSPHATE 10 MG/ML IJ SOLN
INTRAMUSCULAR | Status: AC
  Filled 2019-10-31: qty 1

## 2019-10-31 MED ORDER — LIDOCAINE 2% (20 MG/ML) 5 ML SYRINGE
INTRAMUSCULAR | Status: AC
  Filled 2019-10-31: qty 5

## 2019-10-31 MED ORDER — FLUORESCEIN SODIUM 10 % IV SOLN
INTRAVENOUS | Status: DC | PRN
  Administered 2019-10-31: .15 mL via INTRAVENOUS

## 2019-10-31 MED ORDER — OXYMETAZOLINE HCL 0.05 % NA SOLN
2.0000 | NASAL | Status: DC
  Filled 2019-10-31: qty 30

## 2019-10-31 MED ORDER — PROMETHAZINE HCL 25 MG PO TABS: 12.5000 mg | ORAL_TABLET | ORAL | Status: DC | PRN

## 2019-10-31 MED ORDER — ONDANSETRON HCL 4 MG/2ML IJ SOLN
INTRAMUSCULAR | Status: AC
  Filled 2019-10-31: qty 2

## 2019-10-31 MED ORDER — PROPOFOL 10 MG/ML IV BOLUS
INTRAVENOUS | Status: AC
  Filled 2019-10-31: qty 20

## 2019-10-31 MED ORDER — SUGAMMADEX SODIUM 500 MG/5ML IV SOLN
INTRAVENOUS | Status: AC
  Filled 2019-10-31: qty 5

## 2019-10-31 MED ORDER — HYDROMORPHONE HCL 1 MG/ML IJ SOLN: 0.5000 mg | INTRAMUSCULAR | Status: DC | PRN

## 2019-10-31 MED ORDER — HEPARIN SODIUM (PORCINE) 5000 UNIT/ML IJ SOLN
5000.0000 [IU] | Freq: Three times a day (TID) | INTRAMUSCULAR | Status: DC
  Administered 2019-11-02 – 2019-11-06 (×14): 5000 [IU] via SUBCUTANEOUS
  Filled 2019-10-31 (×15): qty 1

## 2019-10-31 MED ORDER — SUGAMMADEX SODIUM 200 MG/2ML IV SOLN
INTRAVENOUS | Status: DC | PRN
  Administered 2019-10-31: 600 mg via INTRAVENOUS

## 2019-10-31 MED ORDER — OXYMETAZOLINE HCL 0.05 % NA SOLN
NASAL | Status: DC | PRN
  Administered 2019-10-31: 30 via TOPICAL

## 2019-10-31 MED ORDER — 0.9 % SODIUM CHLORIDE (POUR BTL) OPTIME
TOPICAL | Status: DC | PRN
  Administered 2019-10-31: 1000 mL

## 2019-10-31 MED ORDER — BACITRACIN ZINC 500 UNIT/GM EX OINT
TOPICAL_OINTMENT | CUTANEOUS | Status: AC
  Filled 2019-10-31: qty 28.35

## 2019-10-31 MED ORDER — POLYETHYLENE GLYCOL 3350 17 G PO PACK: 17.0000 g | PACK | Freq: Every day | ORAL | Status: DC | PRN

## 2019-10-31 MED ORDER — PROPOFOL 10 MG/ML IV BOLUS
INTRAVENOUS | Status: DC | PRN
  Administered 2019-10-31: 180 mg via INTRAVENOUS

## 2019-10-31 MED ORDER — SODIUM CHLORIDE (PF) 0.9 % IJ SOLN
INTRAMUSCULAR | Status: DC | PRN
  Administered 2019-10-31: 27 mL

## 2019-10-31 MED ORDER — CHLORHEXIDINE GLUCONATE CLOTH 2 % EX PADS: 6.0000 | MEDICATED_PAD | Freq: Once | CUTANEOUS | Status: DC

## 2019-10-31 MED ORDER — LIDOCAINE-EPINEPHRINE 1 %-1:100000 IJ SOLN
INTRAMUSCULAR | Status: DC | PRN
  Administered 2019-10-31: 6 mL
  Administered 2019-10-31: 3 mL

## 2019-10-31 MED ORDER — MIDAZOLAM HCL 5 MG/5ML IJ SOLN
INTRAMUSCULAR | Status: DC | PRN
  Administered 2019-10-31: 2 mg via INTRAVENOUS

## 2019-10-31 MED ORDER — OXYMETAZOLINE HCL 0.05 % NA SOLN
NASAL | Status: AC
  Filled 2019-10-31: qty 30

## 2019-10-31 MED ORDER — ACETAMINOPHEN 650 MG RE SUPP: 650.0000 mg | RECTAL | Status: DC | PRN

## 2019-10-31 MED ORDER — SUCCINYLCHOLINE CHLORIDE 20 MG/ML IJ SOLN
INTRAMUSCULAR | Status: DC | PRN
  Administered 2019-10-31: 140 mg via INTRAVENOUS

## 2019-10-31 MED ORDER — LACTATED RINGERS IV SOLN
INTRAVENOUS | Status: DC | PRN
  Administered 2019-10-31 (×2): via INTRAVENOUS

## 2019-10-31 MED ORDER — LIDOCAINE HCL (CARDIAC) PF 100 MG/5ML IV SOSY
PREFILLED_SYRINGE | INTRAVENOUS | Status: DC | PRN
  Administered 2019-10-31: 50 mg via INTRAVENOUS
  Administered 2019-10-31: 100 mg via INTRAVENOUS

## 2019-10-31 SURGICAL SUPPLY — 96 items
BLADE CLIPPER SURG (BLADE) ×2 IMPLANT
BLADE RAD40 ROTATE 4M 4 5PK (BLADE) IMPLANT
BLADE RAD60 ROTATE M4 4 5PK (BLADE) IMPLANT
BLADE TRICUT ROTATE M4 4 5PK (BLADE) IMPLANT
BNDG GAUZE ELAST 4 BULKY (GAUZE/BANDAGES/DRESSINGS) IMPLANT
BUR ACORN 9.0 PRECISION (BURR) IMPLANT
BUR MATCHSTICK NEURO 3.0 LAGG (BURR) IMPLANT
BUR SPIRAL ROUTER 2.3 (BUR) IMPLANT
CANISTER SUCT 3000ML PPV (MISCELLANEOUS) ×4 IMPLANT
CATH LUMBAR HERMETIC 14G (CATHETERS) ×1 IMPLANT
CATHETER LUMBAR HERMETIC 14G (CATHETERS) ×2
CLIP VESOCCLUDE MED 6/CT (CLIP) IMPLANT
CONT SPEC 4OZ CLIKSEAL STRL BL (MISCELLANEOUS) ×2 IMPLANT
COVER WAND RF STERILE (DRAPES) IMPLANT
DRAPE C-ARM 42X72 X-RAY (DRAPES) ×6 IMPLANT
DRAPE HALF SHEET 40X57 (DRAPES) IMPLANT
DRAPE INCISE IOBAN 85X60 (DRAPES) ×2 IMPLANT
DRAPE NEUROLOGICAL W/INCISE (DRAPES) ×2 IMPLANT
DRAPE SHEET LG 3/4 BI-LAMINATE (DRAPES) ×2 IMPLANT
DRAPE SURG 17X23 STRL (DRAPES) IMPLANT
DRAPE WARM FLUID 44X44 (DRAPES) ×2 IMPLANT
DRESSING NASAL KENNEDY 3.5X.9 (MISCELLANEOUS) IMPLANT
DRESSING NASAL POPE 10X1.5X2.5 (GAUZE/BANDAGES/DRESSINGS) ×2 IMPLANT
DRSG NASAL KENNEDY 3.5X.9 (MISCELLANEOUS)
DRSG NASAL POPE 10X1.5X2.5 (GAUZE/BANDAGES/DRESSINGS) ×4
DRSG NASOPORE 8CM (GAUZE/BANDAGES/DRESSINGS) ×2 IMPLANT
DRSG OPSITE 11X17.75 LRG (GAUZE/BANDAGES/DRESSINGS) ×2 IMPLANT
DRSG OPSITE POSTOP 4X12 (GAUZE/BANDAGES/DRESSINGS) ×2 IMPLANT
DURAGUARD 04CMX04CM ×2 IMPLANT
DURAPREP 26ML APPLICATOR (WOUND CARE) ×2 IMPLANT
DURAPREP 6ML APPLICATOR 50/CS (WOUND CARE) ×2 IMPLANT
ELECT REM PT RETURN 9FT ADLT (ELECTROSURGICAL) ×2
ELECTRODE REM PT RTRN 9FT ADLT (ELECTROSURGICAL) ×1 IMPLANT
EVACUATOR 1/8 PVC DRAIN (DRAIN) IMPLANT
EVACUATOR SILICONE 100CC (DRAIN) IMPLANT
FILTER ARTHROSCOPY CONVERTOR (FILTER) IMPLANT
GAUZE 4X4 16PLY RFD (DISPOSABLE) IMPLANT
GAUZE SPONGE 4X4 12PLY STRL (GAUZE/BANDAGES/DRESSINGS) ×2 IMPLANT
GLOVE BIO SURGEON STRL SZ7.5 (GLOVE) ×4 IMPLANT
GLOVE BIOGEL PI IND STRL 7.5 (GLOVE) ×2 IMPLANT
GLOVE BIOGEL PI INDICATOR 7.5 (GLOVE) ×2
GLOVE ECLIPSE 7.5 STRL STRAW (GLOVE) ×2 IMPLANT
GLOVE EXAM NITRILE LRG STRL (GLOVE) IMPLANT
GLOVE EXAM NITRILE XL STR (GLOVE) IMPLANT
GLOVE EXAM NITRILE XS STR PU (GLOVE) IMPLANT
GOWN STRL REUS W/ TWL LRG LVL3 (GOWN DISPOSABLE) ×4 IMPLANT
GOWN STRL REUS W/ TWL XL LVL3 (GOWN DISPOSABLE) IMPLANT
GOWN STRL REUS W/TWL 2XL LVL3 (GOWN DISPOSABLE) IMPLANT
GOWN STRL REUS W/TWL LRG LVL3 (GOWN DISPOSABLE) ×8
GOWN STRL REUS W/TWL XL LVL3 (GOWN DISPOSABLE)
GRAFT DURAGEN MATRIX 1WX1L (Tissue) ×2 IMPLANT
HEMOSTAT SURGICEL 2X14 (HEMOSTASIS) IMPLANT
KIT BASIN OR (CUSTOM PROCEDURE TRAY) ×2 IMPLANT
KIT TURNOVER KIT B (KITS) ×2 IMPLANT
NEEDLE EPID 14G 6 TOUHY (NEEDLE) ×2 IMPLANT
NEEDLE HYPO 18GX1.5 BLUNT FILL (NEEDLE) ×2 IMPLANT
NEEDLE HYPO 22GX1.5 SAFETY (NEEDLE) ×2 IMPLANT
NEEDLE PRECISIONGLIDE 27X1.5 (NEEDLE) ×2 IMPLANT
NS IRRIG 1000ML POUR BTL (IV SOLUTION) ×4 IMPLANT
PACK CRANIOTOMY CUSTOM (CUSTOM PROCEDURE TRAY) ×2 IMPLANT
PACK EENT II TURBAN DRAPE (CUSTOM PROCEDURE TRAY) ×2 IMPLANT
PAD ARMBOARD 7.5X6 YLW CONV (MISCELLANEOUS) ×8 IMPLANT
PATTIES SURGICAL .5 X.5 (GAUZE/BANDAGES/DRESSINGS) IMPLANT
PATTIES SURGICAL .5 X3 (DISPOSABLE) ×2 IMPLANT
PATTIES SURGICAL 1X1 (DISPOSABLE) IMPLANT
SEALANT ADHERUS EXTEND TIP (MISCELLANEOUS) ×2 IMPLANT
SHEATH ENDOSCRUB 0 DEG (SHEATH) IMPLANT
SHEATH ENDOSCRUB 30 DEG (SHEATH) IMPLANT
SPECIMEN JAR SMALL (MISCELLANEOUS) ×2 IMPLANT
SPONGE NEURO XRAY DETECT 1X3 (DISPOSABLE) IMPLANT
SPONGE SURGIFOAM ABS GEL 100 (HEMOSTASIS) ×2 IMPLANT
STAPLER VISISTAT 35W (STAPLE) ×2 IMPLANT
STOCKINETTE 6  STRL (DRAPES) ×1
STOCKINETTE 6 STRL (DRAPES) ×1 IMPLANT
SURGIFLO W/THROMBIN 8M KIT (HEMOSTASIS) IMPLANT
SUT ETHILON 3 0 FSL (SUTURE) ×2 IMPLANT
SUT ETHILON 3 0 PS 1 (SUTURE) IMPLANT
SUT NURALON 4 0 TR CR/8 (SUTURE) ×6 IMPLANT
SUT STEEL 0 (SUTURE)
SUT STEEL 0 18XMFL TIE 17 (SUTURE) IMPLANT
SUT VIC AB 0 CT1 18XCR BRD8 (SUTURE) ×2 IMPLANT
SUT VIC AB 0 CT1 8-18 (SUTURE) ×2
SUT VIC AB 3-0 SH 8-18 (SUTURE) ×4 IMPLANT
SWAB COLLECTION DEVICE MRSA (MISCELLANEOUS) IMPLANT
SWAB CULTURE ESWAB REG 1ML (MISCELLANEOUS) IMPLANT
SYR 50ML SLIP (SYRINGE) IMPLANT
SYR CONTROL 10ML LL (SYRINGE) ×2 IMPLANT
SYR TB 1ML 25GX5/8 (SYRINGE) ×2 IMPLANT
TOWEL GREEN STERILE (TOWEL DISPOSABLE) ×2 IMPLANT
TOWEL GREEN STERILE FF (TOWEL DISPOSABLE) ×6 IMPLANT
TRAY ENT MC OR (CUSTOM PROCEDURE TRAY) IMPLANT
TRAY FOLEY MTR SLVR 16FR STAT (SET/KITS/TRAYS/PACK) ×2 IMPLANT
TUBE CONNECTING 12X1/4 (SUCTIONS) ×6 IMPLANT
TUBING EXTENTION W/L.L. (IV SETS) IMPLANT
UNDERPAD 30X30 (UNDERPADS AND DIAPERS) ×2 IMPLANT
WATER STERILE IRR 1000ML POUR (IV SOLUTION) ×4 IMPLANT

## 2019-10-31 NOTE — Op Note (Signed)
OPERATIVE REPORT  DATE OF SURGERY: 10/31/2019  PATIENT:  Anna Morrison,  33 y.o. female  PRE-OPERATIVE DIAGNOSIS:  Nasofrontal encephalocele  POST-OPERATIVE DIAGNOSIS:  Nasofrontal encephalocele  PROCEDURE:  Procedure(s): Endonasal repair of cerebrospinal fluid leak with placement of lumbar drain PLACEMENT OF LUMBAR DRAIN ENDOSCOPIC REPAIR OF CEREBROSPINAL FLUID LEAK  SURGEON:  Beckie Salts, MD  ASSISTANTS: Co-surgeon-Dr. Venetia Constable  ANESTHESIA:   General   EBL: 100 ml  DRAINS: Lumbar drain  LOCAL MEDICATIONS USED: 1% Xylocaine with epinephrine  SPECIMEN: Right ethmoid tissue  COUNTS:  Correct  PROCEDURE DETAILS: The patient was taken to the operating room and placed on the operating table in the supine position. Following induction of general endotracheal anesthesia, a lumbar drain was inserted by neurosurgery.  The patient was then positioned supine and draped in a standard fashion for nasal surgery.  The 0 and 30 degree nasal endoscopes were used throughout the case.  Fluorescein dye had been injected into the lumbar drain.  Endoscopy of the right nasal cavity revealed the ethmoid cavity with the previous repair and fluorescein dye was seen.  This was suctioned out.  Local anesthetic was infiltrated into the middle turbinate graft, the lateral nasal wall, the septum, and the upper aspect of the ethmoid sinus/fovea.  An incision was created in the left side of the anterior septum and several cartilage pieces were harvested for potential use later in the case.  Inspection of the ethmoid roof revealed some soft tissue that was removed and sent for pathologic evaluation.  Further inspection and Trendelenburg positioning revealed fluorescein leaking from the ethmoid roof at the original repair site.  Care was then given back to neurosurgery and I was available to assist in identifying the actual fovea defect, CSF leak, and what appeared to be a very small  encephalocele.  After Dr. Venetia Constable completed his repair and glue application, the nasal cavities were packed with long Slimline Merocel packs bilaterally.  They were then inflated with local anesthetic solution.  The oropharynx was suctioned of blood and secretions.  Patient was then awakened extubated and transferred to recovery in stable condition.    PATIENT DISPOSITION:  To PACU, stable

## 2019-10-31 NOTE — Anesthesia Postprocedure Evaluation (Signed)
Anesthesia Post Note  Patient: Anna Morrison  Procedure(s) Performed: Endonasal repair of cerebrospinal fluid leak with placement of lumbar drain (N/A ) PLACEMENT OF LUMBAR DRAIN (N/A ) ENDOSCOPIC REPAIR OF CEREBROSPINAL FLUID LEAK (N/A )     Patient location during evaluation: PACU Anesthesia Type: General Level of consciousness: awake Pain management: pain level controlled Vital Signs Assessment: post-procedure vital signs reviewed and stable Respiratory status: spontaneous breathing Cardiovascular status: stable Postop Assessment: no apparent nausea or vomiting Anesthetic complications: no    Last Vitals:  Vitals:   10/31/19 1350 10/31/19 1400  BP: 137/87 (!) 147/92  Pulse: 100 100  Resp: 15 17  Temp: 37 C   SpO2: 99% 98%    Last Pain:  Vitals:   10/31/19 1350  TempSrc: Oral  PainSc:                  Bronsyn Shappell

## 2019-10-31 NOTE — Interval H&P Note (Signed)
History and Physical Interval Note:  10/31/2019 8:19 AM  Anna Morrison  has presented today for surgery, with the diagnosis of Nasofrontal encephalocele.  The various methods of treatment have been discussed with the patient and family. After consideration of risks, benefits and other options for treatment, the patient has consented to  Procedure(s) with comments: Endonasal repair of cerebrospinal fluid leak with placement of lumbar drain (N/A) - Endonasal repair of cerebrospinal fluid leak with placement of lumbar drain PLACEMENT OF LUMBAR DRAIN (N/A) ENDOSCOPIC SINUS SURGERY (N/A) as a surgical intervention.  The patient's history has been reviewed, patient examined, no change in status, stable for surgery.  I have reviewed the patient's chart and labs.  Questions were answered to the patient's satisfaction.     Anna Morrison

## 2019-10-31 NOTE — Anesthesia Procedure Notes (Signed)
Procedure Name: Intubation Date/Time: 10/31/2019 8:40 AM Performed by: Yesika Rispoli T, CRNA Pre-anesthesia Checklist: Patient identified, Emergency Drugs available, Suction available and Patient being monitored Patient Re-evaluated:Patient Re-evaluated prior to induction Oxygen Delivery Method: Circle system utilized Preoxygenation: Pre-oxygenation with 100% oxygen Induction Type: IV induction and Cricoid Pressure applied Ventilation: Mask ventilation without difficulty Laryngoscope Size: Miller and 2 Grade View: Grade I Tube type: Oral Tube size: 7.5 mm Number of attempts: 1 Airway Equipment and Method: Patient positioned with wedge pillow and Stylet Placement Confirmation: ETT inserted through vocal cords under direct vision,  positive ETCO2 and breath sounds checked- equal and bilateral Secured at: 22 cm Tube secured with: Tape Dental Injury: Teeth and Oropharynx as per pre-operative assessment

## 2019-10-31 NOTE — Anesthesia Preprocedure Evaluation (Signed)
Anesthesia Evaluation  Patient identified by MRN, date of birth, ID band Patient awake    Reviewed: Allergy & Precautions, NPO status , Patient's Chart, lab work & pertinent test results  Airway Mallampati: II  TM Distance: >3 FB     Dental   Pulmonary pneumonia,    breath sounds clear to auscultation       Cardiovascular negative cardio ROS   Rhythm:Regular Rate:Normal     Neuro/Psych    GI/Hepatic Neg liver ROS, GERD  ,  Endo/Other  negative endocrine ROS  Renal/GU negative Renal ROS     Musculoskeletal   Abdominal   Peds  Hematology  (+) anemia ,   Anesthesia Other Findings   Reproductive/Obstetrics                             Anesthesia Physical Anesthesia Plan  ASA: III  Anesthesia Plan: General   Post-op Pain Management:    Induction: Intravenous  PONV Risk Score and Plan: 3 and Ondansetron, Dexamethasone and Midazolam  Airway Management Planned: Oral ETT  Additional Equipment: Arterial line  Intra-op Plan:   Post-operative Plan: Possible Post-op intubation/ventilation  Informed Consent: I have reviewed the patients History and Physical, chart, labs and discussed the procedure including the risks, benefits and alternatives for the proposed anesthesia with the patient or authorized representative who has indicated his/her understanding and acceptance.     Dental advisory given  Plan Discussed with: Anesthesiologist and CRNA  Anesthesia Plan Comments:         Anesthesia Quick Evaluation

## 2019-10-31 NOTE — Transfer of Care (Signed)
Immediate Anesthesia Transfer of Care Note  Patient: Anna Morrison  Procedure(s) Performed: Endonasal repair of cerebrospinal fluid leak with placement of lumbar drain (N/A ) PLACEMENT OF LUMBAR DRAIN (N/A ) ENDOSCOPIC REPAIR OF CEREBROSPINAL FLUID LEAK (N/A )  Patient Location: PACU  Anesthesia Type:General  Level of Consciousness: patient cooperative  Airway & Oxygen Therapy: Patient Spontanous Breathing and Patient connected to face mask oxygen  Post-op Assessment: Report given to RN, Post -op Vital signs reviewed and stable and Patient moving all extremities  Post vital signs: Reviewed and stable  Last Vitals:  Vitals Value Taken Time  BP 146/90 10/31/19 1255  Temp 36.8 C 10/31/19 1255  Pulse 105 10/31/19 1255  Resp 19 10/31/19 1259  SpO2 99 % 10/31/19 1255  Vitals shown include unvalidated device data.  Last Pain:  Vitals:   10/31/19 0729  TempSrc: Oral  PainSc:       Patients Stated Pain Goal: 3 (96/22/29 7989)  Complications: No apparent anesthesia complications

## 2019-10-31 NOTE — Progress Notes (Addendum)
Neurosurgery Service Post-operative progress note  Assessment & Plan: 33 y.o. woman s/p repeat endonasal anterior skull base CSF leak repair w/ LD placement. Seen in PACU, FCx4, recovering well.   -admit to 4N -drain 15cc/hr from lumbar drain -likely 72-96 hours of lumbar drainage then clamp trial  Judith Part  10/31/19 1:46 PM

## 2019-10-31 NOTE — Op Note (Signed)
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 This was repaired last year and she did well for roughly 6 months without leakage, at which time the rhinorrhea recurred. Endoscopy showed possible  CSF leakage, so I recommended surgical repair with temporary CSF diversion. This was discussed with the patient as well as risks, benefits, and alternatives and wished to proceed with surgery.   OPERATIVE DETAIL: The patient was taken to the operating room, anesthesia was induced by the anesthesia team, and then the patient was placed prone on the OR table for lumbar drain placement. A formal time out was performed with two patient identifiers and confirmed the operative site. The area was then prepped and draped in a sterile fashion. Fluoroscopy was used to guide a 14 gauge Tuohy needle into the L4-5 inter-laminar space, at which time CSF was encountered under moderate pressure, but did not seem concerning for true high pressure given the patient's habitus and that she was lying prone. The catheter was inserted through the needle and threaded without resistance, it did not require use of the wire. The needle was removed and it was purse string sutured with 2 stay sutures. Afterwards, with good CSF flow, CSF was withdrawn and mixed to make 15mg  of 0.1% fluorescein solution which was slowly infused and flushed forward with CSF. The patient was then turned supine for the endonasal portion.  Dr. Constance Holster performed the  endonasal approach and visualized fluorescein dye in the nasal cavity early in the approach. I scrubbed in to assist and we endoscopically identified two areas of leakage at the inferior aspect of the prior repair site after mobilizing the inferior aspect of the nasoseptal flap. The mucosa was elevated and cleared around these defects and a repair was performed. At each location, this consisted of a piece of duragen placed intracranially followed by a larger piece of duraguard extracranially laid down to cover the leak. The nasoseptal flap was relaxed as Adheris sealant was used to keep the duraguard in place as well as keep the nasoseptal flap apposed. Following the repair, Dr. Constance Holster completed the closure and the patient was returned to anesthesia for emergence.    EBL:  41mL   DRAINS: Lumbar spinal CSF drain   Judith Part, MD 10/31/19 12:33 PM

## 2019-10-31 NOTE — Addendum Note (Signed)
Addendum  created 10/31/19 1739 by Ollen Bowl, CRNA   Charge Capture section accepted

## 2019-11-01 ENCOUNTER — Encounter (HOSPITAL_COMMUNITY): Payer: Self-pay | Admitting: Neurological Surgery

## 2019-11-01 LAB — SURGICAL PATHOLOGY

## 2019-11-01 MED ORDER — CHLORHEXIDINE GLUCONATE CLOTH 2 % EX PADS
6.0000 | MEDICATED_PAD | Freq: Every day | CUTANEOUS | Status: DC
  Administered 2019-11-02 – 2019-11-05 (×4): 6 via TOPICAL

## 2019-11-01 MED ORDER — CHLORHEXIDINE GLUCONATE CLOTH 2 % EX PADS
6.0000 | MEDICATED_PAD | Freq: Every day | CUTANEOUS | Status: DC
  Administered 2019-11-01: 6 via TOPICAL

## 2019-11-01 NOTE — Progress Notes (Signed)
Neurosurgery Service Progress Note  Subjective: No acute events overnight, no yellow drainage from nares, some headaches last night but improved this morning  Objective: Vitals:   11/01/19 0300 11/01/19 0400 11/01/19 0500 11/01/19 0600  BP: 130/80 131/88 (!) 140/91 (!) 151/89  Pulse: 97 93 99   Resp: (!) 23 20 20  (!) 24  Temp:  98.3 F (36.8 C)    TempSrc:  Oral    SpO2: 99% 99% 100% 100%  Weight:      Height:       Temp (24hrs), Avg:98.3 F (36.8 C), Min:97.8 F (36.6 C), Max:98.6 F (37 C)  CBC Latest Ref Rng & Units 10/31/2019 10/24/2019 10/02/2018  WBC 4.0 - 10.5 K/uL 14.6(H) 7.9 10.5  Hemoglobin 12.0 - 15.0 g/dL 12.8 12.2 10.7(L)  Hematocrit 36.0 - 46.0 % 43.3 41.9 39.5  Platelets 150 - 400 K/uL 353 333 461(H)   BMP Latest Ref Rng & Units 10/31/2019 10/02/2018 07/23/2018  Glucose 70 - 99 mg/dL - 89 85  BUN 6 - 20 mg/dL - 7 9  Creatinine 0.44 - 1.00 mg/dL 0.78 0.68 0.70  Sodium 135 - 145 mmol/L - 139 140  Potassium 3.5 - 5.1 mmol/L - 3.8 4.4  Chloride 98 - 111 mmol/L - 104 105  CO2 22 - 32 mmol/L - 27 -  Calcium 8.9 - 10.3 mg/dL - 9.8 -    Intake/Output Summary (Last 24 hours) at 11/01/2019 8453 Last data filed at 11/01/2019 0600 Gross per 24 hour  Intake 1900 ml  Output 2487 ml  Net -587 ml    Current Facility-Administered Medications:  .  acetaminophen (TYLENOL) tablet 650 mg, 650 mg, Oral, Q4H PRN **OR** acetaminophen (TYLENOL) suppository 650 mg, 650 mg, Rectal, Q4H PRN, Coner Gibbard A, MD .  docusate sodium (COLACE) capsule 100 mg, 100 mg, Oral, BID, Judith Part, MD, 100 mg at 10/31/19 1655 .  Fluorescein Sodium 10 % injection 10 mg, 10 mg, Intravenous, Once, Pastor Sgro, Joyice Faster, MD .  Derrill Memo ON 11/02/2019] heparin injection 5,000 Units, 5,000 Units, Subcutaneous, Q8H, Yannet Rincon A, MD .  HYDROcodone-acetaminophen (NORCO/VICODIN) 5-325 MG per tablet 1 tablet, 1 tablet, Oral, Q4H PRN, Judith Part, MD, 1 tablet at 10/31/19 1805 .   HYDROmorphone (DILAUDID) injection 0.5 mg, 0.5 mg, Intravenous, Q3H PRN, Judith Part, MD .  ondansetron (ZOFRAN) tablet 4 mg, 4 mg, Oral, Q4H PRN **OR** ondansetron (ZOFRAN) injection 4 mg, 4 mg, Intravenous, Q4H PRN, Ransom Nickson A, MD .  polyethylene glycol (MIRALAX / GLYCOLAX) packet 17 g, 17 g, Oral, Daily PRN, Judith Part, MD .  promethazine (PHENERGAN) tablet 12.5-25 mg, 12.5-25 mg, Oral, Q4H PRN, Judith Part, MD   Physical Exam: AOx3, PERRL, EOMI, FS, Strength 5/5 x4, SILTx4, no drift Nasal packing in place w/o drainage  Assessment & Plan: 33 y.o. 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  -day 1 of 3 of lumbar drainage, drain 15cc/hr. If pt drains 15cc, can clamp the drain for the remainder of the hour. Will clamp LD on 11/15 and assess for leakage, then removed on 11/16.  -SCDs/TEDs, hold SQH until [place]  11/01/19 6:49 AM

## 2019-11-01 NOTE — Progress Notes (Signed)
Patient ID: Anna Morrison, female   DOB: 10/04/1986, 33 y.o.   MRN: [id number]   Postop day 1, doing well.  No complaints.  She is awake and alert and able to communicate easily.  Packing in place.  No active bleeding.  Eyes look normal.  Stable postop.  Plan to remove nasal packs on Monday.  This can be done in the hospital or if the situation allows she can go home and we can remove these in the office on Monday.

## 2019-11-02 NOTE — Progress Notes (Signed)
Neurosurgery Service Progress Note  Subjective: No acute events overnight, no yellow drainage from nares, some non-fluorescein drainage from the right nare but unable to reproduce this morning  Objective: Vitals:   11/02/19 0300 11/02/19 0400 11/02/19 0500 11/02/19 0600  BP: 133/88 129/82 130/72 135/80  Pulse: (!) 101 96 95 88  Resp: (!) 21 (!) 21 (!) 21 (!) 26  Temp:  98.5 F (36.9 C)    TempSrc:  Oral    SpO2: 100% 98% 97% 91%  Weight:      Height:       Temp (24hrs), Avg:98.8 F (37.1 C), Min:98.4 F (36.9 C), Max:99.7 F (37.6 C)  CBC Latest Ref Rng & Units 10/31/2019 10/24/2019 10/02/2018  WBC 4.0 - 10.5 K/uL 14.6(H) 7.9 10.5  Hemoglobin 12.0 - 15.0 g/dL 12.8 12.2 10.7(L)  Hematocrit 36.0 - 46.0 % 43.3 41.9 39.5  Platelets 150 - 400 K/uL 353 333 461(H)   BMP Latest Ref Rng & Units 10/31/2019 10/02/2018 07/23/2018  Glucose 70 - 99 mg/dL - 89 85  BUN 6 - 20 mg/dL - 7 9  Creatinine 0.44 - 1.00 mg/dL 0.78 0.68 0.70  Sodium 135 - 145 mmol/L - 139 140  Potassium 3.5 - 5.1 mmol/L - 3.8 4.4  Chloride 98 - 111 mmol/L - 104 105  CO2 22 - 32 mmol/L - 27 -  Calcium 8.9 - 10.3 mg/dL - 9.8 -    Intake/Output Summary (Last 24 hours) at 11/02/2019 0756 Last data filed at 11/02/2019 0600 Gross per 24 hour  Intake -  Output 1362 ml  Net -1362 ml    Current Facility-Administered Medications:  .  acetaminophen (TYLENOL) tablet 650 mg, 650 mg, Oral, Q4H PRN **OR** acetaminophen (TYLENOL) suppository 650 mg, 650 mg, Rectal, Q4H PRN, Judith Part, MD .  Chlorhexidine Gluconate Cloth 2 % PADS 6 each, 6 each, Topical, Q0600, Judith Part, MD .  docusate sodium (COLACE) capsule 100 mg, 100 mg, Oral, BID, Judith Part, MD, 100 mg at 11/01/19 1031 .  Fluorescein Sodium 10 % injection 10 mg, 10 mg, Intravenous, Once, Delmo Matty A, MD .  heparin injection 5,000 Units, 5,000 Units, Subcutaneous, Q8H, Judith Part, MD, 5,000 Units at 11/02/19 [phone] .   HYDROcodone-acetaminophen (NORCO/VICODIN) 5-325 MG per tablet 1 tablet, 1 tablet, Oral, Q4H PRN, Judith Part, MD, 1 tablet at 10/31/19 1805 .  HYDROmorphone (DILAUDID) injection 0.5 mg, 0.5 mg, Intravenous, Q3H PRN, Judith Part, MD .  ondansetron (ZOFRAN) tablet 4 mg, 4 mg, Oral, Q4H PRN **OR** ondansetron (ZOFRAN) injection 4 mg, 4 mg, Intravenous, Q4H PRN, Makina Skow A, MD .  polyethylene glycol (MIRALAX / GLYCOLAX) packet 17 g, 17 g, Oral, Daily PRN, Judith Part, MD .  promethazine (PHENERGAN) tablet 12.5-25 mg, 12.5-25 mg, Oral, Q4H PRN, Judith Part, MD   Physical Exam: AOx3, PERRL, EOMI, FS, Strength 5/5 x4, SILTx4, no drift Nasal packing in place w/o drainage  Assessment & Plan: 33 y.o. 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  -day 2 of 5 of lumbar drainage, she is not having any headaches or other low-pressure symptoms, will increase drainage to 20cc/hr just in case her prior drainage was CSF. If she becomes symptomatic, will decrease back to 15cc/hr. Will clamp LD on 11/16 and assess for leakage, then removed on 11/17.  -SCDs/TEDs, start [place]  11/02/19 7:56 AM

## 2019-11-03 MED ORDER — HYDROXYZINE HCL 50 MG PO TABS
50.0000 mg | ORAL_TABLET | Freq: Four times a day (QID) | ORAL | Status: DC | PRN
  Administered 2019-11-03 – 2019-11-04 (×2): 50 mg via ORAL
  Filled 2019-11-03 (×4): qty 1

## 2019-11-03 NOTE — Progress Notes (Signed)
Subjective: The patient is alert and pleasant.  She is in no apparent distress.  Objective: Vital signs in last 24 hours: Temp:  [97.6 F (36.4 C)-99 F (37.2 C)] 97.6 F (36.4 C) (11/14 0800) Pulse Rate:  [92-103] 94 (11/14 0700) Resp:  [11-22] 21 (11/14 0700) BP: (113-152)/(68-120) 136/107 (11/14 0800) SpO2:  [97 %-100 %] 98 % (11/14 0700) Estimated body mass index is 55.03 kg/m as calculated from the following:   Height as of this encounter: 5\' 1"  (1.549 m).   Weight as of this encounter: 132.1 kg.   Intake/Output from previous day: 11/13 0701 - 11/14 0700 In: -  Out: 1727 [Urine:1255; Drains:472] Intake/Output this shift: Total I/O In: -  Out: 17 [Drains:17]  Physical exam the patient is alert and oriented.  Her vision is grossly normal.  She is moving all 4 extremities well.  I do not see any CSF rhinorrhea.  Lab Results: Recent Labs    10/31/19 1422  WBC 14.6*  HGB 12.8  HCT 43.3  PLT 353   BMET Recent Labs    10/31/19 1422  CREATININE 0.78    Studies/Results: No results found.  Assessment/Plan: Postop day #3: The patient is doing well.  We will continue tremendously opening the lumbar drain for now.  She can mobilize as long as the lumbar drain is closed.  LOS: 3 days     Ophelia Charter 11/03/2019, 8:39 AM

## 2019-11-04 MED ORDER — DIPHENHYDRAMINE-ZINC ACETATE 2-0.1 % EX CREA
TOPICAL_CREAM | Freq: Two times a day (BID) | CUTANEOUS | Status: DC | PRN
  Filled 2019-11-04: qty 28

## 2019-11-04 NOTE — Progress Notes (Signed)
Subjective: The patient is alert and pleasant.  She is in no apparent distress.  By report she has fluid building up in the back of her throat frequently.  Objective: Vital signs in last 24 hours: Temp:  [98.6 F (37 C)-99.8 F (37.7 C)] 99 F (37.2 C) (11/15 0400) Pulse Rate:  [101-116] 116 (11/15 0600) Resp:  [14-29] 18 (11/15 0600) BP: (120-149)/(69-114) 141/114 (11/15 0800) SpO2:  [94 %-99 %] 95 % (11/15 0600) Estimated body mass index is 55.03 kg/m as calculated from the following:   Height as of this encounter: 5\' 1"  (1.549 m).   Weight as of this encounter: 132.1 kg.   Intake/Output from previous day: 11/14 0701 - 11/15 0700 In: -  Out: 753 [Urine:325; Drains:428] Intake/Output this shift: No intake/output data recorded.  Physical exam patient is alert and oriented.  She is moving all 4 extremities.  I do not see any evidence of CSF otorrhea.  Lab Results: No results for input(s): WBC, HGB, HCT, PLT in the last 72 hours. BMET No results for input(s): NA, K, CL, CO2, GLUCOSE, BUN, CREATININE, CALCIUM in the last 72 hours.  Studies/Results: No results found.  Assessment/Plan: Postop day #4: No fluid in the back of her throat is worrisome for a persistent CSF leak.  I will change the patient's spinal fluid drainage from intermittent to continuous.  We will raise the head of her bed to 45 degrees.  LOS: 4 days     Anna Morrison 11/04/2019, 8:13 AM

## 2019-11-05 MED ORDER — WHITE PETROLATUM EX OINT
TOPICAL_OINTMENT | CUTANEOUS | Status: AC
  Filled 2019-11-05: qty 28.35

## 2019-11-05 NOTE — Progress Notes (Signed)
Neurosurgery Service Progress Note  Subjective: No acute events this weekend, had some possible drainage on Saturday that she did not think was CSF  Objective: Vitals:   11/05/19 1000 11/05/19 1100 11/05/19 1200 11/05/19 1300  BP: 132/78 (!) 135/108 (!) 152/110 114/87  Pulse: (!) 114 (!) 112    Resp: 17 20 14  (!) 23  Temp:   98.4 F (36.9 C)   TempSrc:   Oral   SpO2: 98% 96%    Weight:      Height:       Temp (24hrs), Avg:98.7 F (37.1 C), Min:97.7 F (36.5 C), Max:99.7 F (37.6 C)  CBC Latest Ref Rng & Units 10/31/2019 10/24/2019 10/02/2018  WBC 4.0 - 10.5 K/uL 14.6(H) 7.9 10.5  Hemoglobin 12.0 - 15.0 g/dL 12.8 12.2 10.7(L)  Hematocrit 36.0 - 46.0 % 43.3 41.9 39.5  Platelets 150 - 400 K/uL 353 333 461(H)   BMP Latest Ref Rng & Units 10/31/2019 10/02/2018 07/23/2018  Glucose 70 - 99 mg/dL - 89 85  BUN 6 - 20 mg/dL - 7 9  Creatinine 0.44 - 1.00 mg/dL 0.78 0.68 0.70  Sodium 135 - 145 mmol/L - 139 140  Potassium 3.5 - 5.1 mmol/L - 3.8 4.4  Chloride 98 - 111 mmol/L - 104 105  CO2 22 - 32 mmol/L - 27 -  Calcium 8.9 - 10.3 mg/dL - 9.8 -    Intake/Output Summary (Last 24 hours) at 11/05/2019 1421 Last data filed at 11/05/2019 1300 Gross per 24 hour  Intake 200 ml  Output 393 ml  Net -193 ml    Current Facility-Administered Medications:  .  acetaminophen (TYLENOL) tablet 650 mg, 650 mg, Oral, Q4H PRN **OR** acetaminophen (TYLENOL) suppository 650 mg, 650 mg, Rectal, Q4H PRN, Judith Part, MD .  Chlorhexidine Gluconate Cloth 2 % PADS 6 each, 6 each, Topical, Q0600, Judith Part, MD, 6 each at 11/05/19 1345 .  diphenhydrAMINE-zinc acetate (BENADRYL) 2-0.1 % cream, , Topical, BID PRN, Bergman, Meghan D, NP .  docusate sodium (COLACE) capsule 100 mg, 100 mg, Oral, BID, Judith Part, MD, 100 mg at 11/04/19 2138 .  Fluorescein Sodium 10 % injection 10 mg, 10 mg, Intravenous, Once, Jamey Harman A, MD .  heparin injection 5,000 Units, 5,000 Units,  Subcutaneous, Q8H, Judith Part, MD, 5,000 Units at 11/05/19 1313 .  HYDROcodone-acetaminophen (NORCO/VICODIN) 5-325 MG per tablet 1 tablet, 1 tablet, Oral, Q4H PRN, Judith Part, MD, 1 tablet at 11/05/19 0302 .  HYDROmorphone (DILAUDID) injection 0.5 mg, 0.5 mg, Intravenous, Q3H PRN, Judith Part, MD .  hydrOXYzine (ATARAX/VISTARIL) tablet 50 mg, 50 mg, Oral, Q6H PRN, Viona Gilmore D, NP, 50 mg at 11/04/19 2056 .  ondansetron (ZOFRAN) tablet 4 mg, 4 mg, Oral, Q4H PRN **OR** ondansetron (ZOFRAN) injection 4 mg, 4 mg, Intravenous, Q4H PRN, Jessiah Steinhart A, MD .  polyethylene glycol (MIRALAX / GLYCOLAX) packet 17 g, 17 g, Oral, Daily PRN, Judith Part, MD .  promethazine (PHENERGAN) tablet 12.5-25 mg, 12.5-25 mg, Oral, Q4H PRN, Shyah Cadmus A, MD .  white petrolatum (VASELINE) gel, , , ,    Physical Exam: AOx3, PERRL, EOMI, FS, Strength 5/5 x4, SILTx4, no drift Packs removed  Assessment & Plan: 33 y.o. 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  -given possible leak over the weekend, out of an abundance of caution, given the difficulty of replacing a LD in her, will drain 1 more day to confirm no  leakage with packs out -clamp LD tomorrow, if no leakage x24h then will d/c LD -SCDs/TEDs, SQH  Jadene Pierini  11/05/19 2:21 PM

## 2019-11-05 NOTE — Progress Notes (Signed)
Patient ID: Anna Morrison, female   DOB: 06-Aug-1986, 33 y.o.   MRN: [id number] Postop day 5, no new complaints.  She is awake and alert.  Bilateral packing removed.  No active bleeding and no obvious CSF leak.  I will see her back in the office in about 2 weeks.

## 2019-11-06 NOTE — Progress Notes (Signed)
Neurosurgery Service Progress Note  Subjective: No acute events overnight, no drainage  Objective: Vitals:   11/06/19 0700 11/06/19 0800 11/06/19 0900 11/06/19 1000  BP: 120/85 (!) 124/91    Pulse: (!) 111 (!) 111 (!) 115 (!) 116  Resp: (!) 22 (!) 21 (!) 28 16  Temp:  98.5 F (36.9 C)    TempSrc:  Oral    SpO2: 98% 100% 96% 99%  Weight:      Height:       Temp (24hrs), Avg:98.6 F (37 C), Min:98.3 F (36.8 C), Max:98.9 F (37.2 C)  CBC Latest Ref Rng & Units 10/31/2019 10/24/2019 10/02/2018  WBC 4.0 - 10.5 K/uL 14.6(H) 7.9 10.5  Hemoglobin 12.0 - 15.0 g/dL 12.8 12.2 10.7(L)  Hematocrit 36.0 - 46.0 % 43.3 41.9 39.5  Platelets 150 - 400 K/uL 353 333 461(H)   BMP Latest Ref Rng & Units 10/31/2019 10/02/2018 07/23/2018  Glucose 70 - 99 mg/dL - 89 85  BUN 6 - 20 mg/dL - 7 9  Creatinine 0.44 - 1.00 mg/dL 0.78 0.68 0.70  Sodium 135 - 145 mmol/L - 139 140  Potassium 3.5 - 5.1 mmol/L - 3.8 4.4  Chloride 98 - 111 mmol/L - 104 105  CO2 22 - 32 mmol/L - 27 -  Calcium 8.9 - 10.3 mg/dL - 9.8 -    Intake/Output Summary (Last 24 hours) at 11/06/2019 1036 Last data filed at 11/06/2019 0900 Gross per 24 hour  Intake 60 ml  Output 235 ml  Net -175 ml    Current Facility-Administered Medications:  .  acetaminophen (TYLENOL) tablet 650 mg, 650 mg, Oral, Q4H PRN **OR** acetaminophen (TYLENOL) suppository 650 mg, 650 mg, Rectal, Q4H PRN, Judith Part, MD .  Chlorhexidine Gluconate Cloth 2 % PADS 6 each, 6 each, Topical, Q0600, Judith Part, MD, 6 each at 11/05/19 1345 .  diphenhydrAMINE-zinc acetate (BENADRYL) 2-0.1 % cream, , Topical, BID PRN, Bergman, Meghan D, NP .  docusate sodium (COLACE) capsule 100 mg, 100 mg, Oral, BID, Judith Part, MD, 100 mg at 11/05/19 2202 .  Fluorescein Sodium 10 % injection 10 mg, 10 mg, Intravenous, Once, Ravonda Brecheen A, MD .  heparin injection 5,000 Units, 5,000 Units, Subcutaneous, Q8H, Judith Part, MD, 5,000 Units at  11/06/19 0631 .  HYDROcodone-acetaminophen (NORCO/VICODIN) 5-325 MG per tablet 1 tablet, 1 tablet, Oral, Q4H PRN, Judith Part, MD, 1 tablet at 11/05/19 2345 .  HYDROmorphone (DILAUDID) injection 0.5 mg, 0.5 mg, Intravenous, Q3H PRN, Judith Part, MD .  hydrOXYzine (ATARAX/VISTARIL) tablet 50 mg, 50 mg, Oral, Q6H PRN, Viona Gilmore D, NP, 50 mg at 11/04/19 2056 .  ondansetron (ZOFRAN) tablet 4 mg, 4 mg, Oral, Q4H PRN **OR** ondansetron (ZOFRAN) injection 4 mg, 4 mg, Intravenous, Q4H PRN, Railee Bonillas A, MD .  polyethylene glycol (MIRALAX / GLYCOLAX) packet 17 g, 17 g, Oral, Daily PRN, Judith Part, MD .  promethazine (PHENERGAN) tablet 12.5-25 mg, 12.5-25 mg, Oral, Q4H PRN, Judith Part, MD   Physical Exam: AOx3, PERRL, EOMI, FS, Strength 5/5 x4, SILTx4, no drift Packs removed  Assessment & Plan: 33 y.o. 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 11/17 LD clamped  -clamp LD today, d/c drain tomorrow & discharge home if no leakage -SCDs/TEDs, SQH  Joyice Faster Lakisha Peyser  11/06/19 10:36 AM

## 2019-11-07 MED ORDER — LIDOCAINE HCL (PF) 1 % IJ SOLN
INTRAMUSCULAR | Status: AC
  Filled 2019-11-07: qty 5

## 2019-11-07 NOTE — Discharge Summary (Signed)
Discharge Summary  Date of Admission: 10/31/2019  Date of Discharge: 11/07/19  Attending Physician: Emelda Brothers, MD  Hospital Course: Patient was admitted following an uncomplicated repeat endonasal endoscopic anterior skull base leak repair and lumbar drain placement. She was recovered in PACU and transferred to 4N. She remained in-house for continued CSF drainage to allow for her leak repair to heal. On 11/17 her drain was clamped and she did not have any recurrence of CSF drainage. On 11/18 her drain was removed. Her hospital course was uncomplicated and the patient was discharged home on 11/07/2019. She will follow up in clinic with me in 3 weeks.  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

## 2019-11-07 NOTE — Progress Notes (Signed)
Neurosurgery Service Progress Note  Subjective: No acute events overnight, no drainage w/ LD clamped while walking around, leaning forwards / etc  Objective: Vitals:   11/07/19 0600 11/07/19 0700 11/07/19 0800 11/07/19 0900  BP: (!) 115/95 107/60 (!) 108/92   Pulse: (!) 102 100  (!) 119  Resp: 20 (!) 25 (!) 22 15  Temp:   98.7 F (37.1 C)   TempSrc:   Oral   SpO2: 100% 99%  99%  Weight:      Height:       Temp (24hrs), Avg:98.5 F (36.9 C), Min:97.7 F (36.5 C), Max:98.7 F (37.1 C)  CBC Latest Ref Rng & Units 10/31/2019 10/24/2019 10/02/2018  WBC 4.0 - 10.5 K/uL 14.6(H) 7.9 10.5  Hemoglobin 12.0 - 15.0 g/dL 12.8 12.2 10.7(L)  Hematocrit 36.0 - 46.0 % 43.3 41.9 39.5  Platelets 150 - 400 K/uL 353 333 461(H)   BMP Latest Ref Rng & Units 10/31/2019 10/02/2018 07/23/2018  Glucose 70 - 99 mg/dL - 89 85  BUN 6 - 20 mg/dL - 7 9  Creatinine 0.44 - 1.00 mg/dL 0.78 0.68 0.70  Sodium 135 - 145 mmol/L - 139 140  Potassium 3.5 - 5.1 mmol/L - 3.8 4.4  Chloride 98 - 111 mmol/L - 104 105  CO2 22 - 32 mmol/L - 27 -  Calcium 8.9 - 10.3 mg/dL - 9.8 -    Intake/Output Summary (Last 24 hours) at 11/07/2019 0956 Last data filed at 11/07/2019 0400 Gross per 24 hour  Intake 360 ml  Output -  Net 360 ml    Current Facility-Administered Medications:  .  acetaminophen (TYLENOL) tablet 650 mg, 650 mg, Oral, Q4H PRN **OR** acetaminophen (TYLENOL) suppository 650 mg, 650 mg, Rectal, Q4H PRN, Judith Part, MD .  Chlorhexidine Gluconate Cloth 2 % PADS 6 each, 6 each, Topical, Q0600, Judith Part, MD, 6 each at 11/05/19 1345 .  diphenhydrAMINE-zinc acetate (BENADRYL) 2-0.1 % cream, , Topical, BID PRN, Bergman, Meghan D, NP .  docusate sodium (COLACE) capsule 100 mg, 100 mg, Oral, BID, Judith Part, MD, 100 mg at 11/06/19 2242 .  Fluorescein Sodium 10 % injection 10 mg, 10 mg, Intravenous, Once, Linn Clavin A, MD .  heparin injection 5,000 Units, 5,000 Units, Subcutaneous,  Q8H, Judith Part, MD, 5,000 Units at 11/06/19 2242 .  HYDROcodone-acetaminophen (NORCO/VICODIN) 5-325 MG per tablet 1 tablet, 1 tablet, Oral, Q4H PRN, Judith Part, MD, 1 tablet at 11/05/19 2345 .  HYDROmorphone (DILAUDID) injection 0.5 mg, 0.5 mg, Intravenous, Q3H PRN, Judith Part, MD .  hydrOXYzine (ATARAX/VISTARIL) tablet 50 mg, 50 mg, Oral, Q6H PRN, Viona Gilmore D, NP, 50 mg at 11/04/19 2056 .  lidocaine (PF) (XYLOCAINE) 1 % injection, , , ,  .  ondansetron (ZOFRAN) tablet 4 mg, 4 mg, Oral, Q4H PRN **OR** ondansetron (ZOFRAN) injection 4 mg, 4 mg, Intravenous, Q4H PRN, Janeya Deyo A, MD .  polyethylene glycol (MIRALAX / GLYCOLAX) packet 17 g, 17 g, Oral, Daily PRN, Judith Part, MD .  promethazine (PHENERGAN) tablet 12.5-25 mg, 12.5-25 mg, Oral, Q4H PRN, Judith Part, MD   Physical Exam: AOx3, PERRL, EOMI, FS, Strength 5/5 x4, SILTx4, no drift Packs removed  Assessment & Plan: 33 y.o. 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 11/17 LD clamped, 11/18 LD removed  -LD removed this morning and sit sutured -discharge home today -SCDs/TEDs, Jackey Loge  11/07/19 9:56 AM

## 2019-11-07 NOTE — Plan of Care (Signed)
D/C instructions given to pt by Dr Zada Finders.

## 2019-11-07 NOTE — Discharge Instructions (Addendum)
Discharge Instructions  No heavy lifting, bearing down, or straining for 1 week after discharge. If you get constipated, call and let Dr. Colleen Can office know so that we can help you move your bowels without excessive straining. No blowing your nose or putting anything inside your nose until discussed with Dr. Constance Holster at follow up.  Your drain site has a clear suture in it that is absorbable and doesn't have to be removed. This will fall off naturally in the next few weeks.  Okay to shower on the day of discharge. Use regular soap and water and try to be gentle when cleaning your incision.   Follow up with Dr. Zada Finders in 3 weeks after discharge. If you do not already have a discharge appointment, please call his office at [phone] to schedule a follow up appointment. If you have any concerns or questions, please call the office and let us know.

## 2019-12-27 DIAGNOSIS — R002 Palpitations: Secondary | ICD-10-CM | POA: Diagnosis not present

## 2019-12-27 DIAGNOSIS — G4734 Idiopathic sleep related nonobstructive alveolar hypoventilation: Secondary | ICD-10-CM | POA: Diagnosis not present

## 2020-01-27 NOTE — Progress Notes (Signed)
Cardiology Office Note:    Date:  01/28/2020   ID:  Anna Morrison, DOB [birth year], MRN [id number]  PCP:  London Pepper, MD  Cardiologist:  No primary care provider on file.  Electrophysiologist:  None   Referring MD: London Pepper, MD   Reason for visit: Palpitations  History of Present Illness:    Anna Morrison is a very pleasant 34 y.o. female with a hx of palpitations, possible sleep apnea, and obesity, she was admitted in November 2020 for an elective procedure - repeat endonasal endoscopic anterior skull base leak repair and lumbar drain placement.   While in the hospital she was noticed to have persistent sinus tachycardia.  The patient also describes that she has episodes of palpitations that can happen with or without exertion, they start all sudden and resolve slowly.  They happen several times a week.  No associated symptoms such as dizziness, chest pain or shortness of breath.  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  No episodes of syncope.  She has family history of congestive heart failure in her father.  Past Medical History:  Diagnosis Date  . Anemia    low iron  . Chronic constipation   . Elevated BP without diagnosis of hypertension   . GERD (gastroesophageal reflux disease)   . Headache   . 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 Nocturnal hypoxemia   . Obesity   . Palpitations   . Pneumonia   . Trichomonal vaginitis   . Vaginal yeast infection     Past Surgical History:  Procedure Laterality Date  . COLONOSCOPY WITH PROPOFOL N/A 05/30/2018   Procedure: COLONOSCOPY WITH PROPOFOL;  Surgeon: Jackquline Denmark, MD;  Location: [facility];  Service: Endoscopy;  Laterality: N/A;  . Endoscopic endonasal repair of ethmoidal encephalocele  10/09/2018  . ETHMOIDECTOMY N/A 10/09/2018   Procedure: ETHMOIDECTOMY;  Surgeon: Izora Gala, MD;  Location: [place];  Service: ENT;  Laterality: N/A;  . NASAL SINUS SURGERY N/A 10/09/2018   Procedure:  NASAL ENDOSCOPY ,REPAIR OF FRONTAL ENCEPHALOCELE,HARVEST OF SEPTO BONE/CARTLEDGE;  Surgeon: Izora Gala, MD;  Location: [place];  Service: ENT;  Laterality: N/A;  . NASAL SINUS SURGERY N/A 10/31/2019   Procedure: ENDOSCOPIC REPAIR OF CEREBROSPINAL FLUID LEAK;  Surgeon: Izora Gala, MD;  Location: [place];  Service: ENT;  Laterality: N/A;  ENDOSCOPIC REPAIR OF CEREBROSPINAL FLUID LEAK  . PLACEMENT OF LUMBAR DRAIN N/A 10/31/2019   Procedure: PLACEMENT OF LUMBAR DRAIN;  Surgeon: Judith Part, MD;  Location: [facility];  Service: Neurosurgery;  Laterality: N/A;  . REPAIR OF CEREBROSPINAL FLUID LEAK N/A 10/09/2018   Procedure: Endoscopic endonasal repair of ethmoidal encephalocele;  Surgeon: Judith Part, MD;  Location: [place];  Service: Neurosurgery;  Laterality: N/A;  . REPAIR OF CEREBROSPINAL FLUID LEAK N/A 10/31/2019   Procedure: Endonasal repair of cerebrospinal fluid leak with placement of lumbar drain;  Surgeon: Judith Part, MD;  Location: [place];  Service: Neurosurgery;  Laterality: N/A;  Endonasal repair of cerebrospinal fluid leak with placement of lumbar drain    Current Medications: Current Meds  Medication Sig  . Cyanocobalamin (VITAMIN B 12 PO) Take by mouth. Chew one gummy by mouth daily  . docusate sodium (COLACE) 100 MG capsule Take 100 mg by mouth daily.  Marland Kitchen ibuprofen (ADVIL) 800 MG tablet as needed.  . IRON PO Take 1 tablet by mouth daily.     Allergies:   Patient has no known allergies.  Social History   Socioeconomic History  . Marital status: Single    Spouse name: Not on file  . Number of children: 0  . Years of education: Not on file  . Highest education level: Not on file  Occupational History  . Occupation: [employer]  Tobacco Use  . Smoking status: Never Smoker  . Smokeless tobacco: Never Used  Substance and Sexual Activity  . Alcohol use: Yes    Comment: 10/09/2018 "couple glasses of wine/month"  . Drug use: Never  . Sexual  activity: Yes    Birth control/protection: Condom  Other Topics Concern  . Not on file  Social History Narrative  . Not on file   Social Determinants of Health   Financial Resource Strain:   . Difficulty of Paying Living Expenses: Not on file  Food Insecurity:   . Worried About Charity fundraiser in the Last Year: Not on file  . Ran Out of Food in the Last Year: Not on file  Transportation Needs:   . Lack of Transportation (Medical): Not on file  . Lack of Transportation (Non-Medical): Not on file  Physical Activity:   . Days of Exercise per Week: Not on file  . Minutes of Exercise per Session: Not on file  Stress:   . Feeling of Stress : Not on file  Social Connections:   . Frequency of Communication with Friends and Family: Not on file  . Frequency of Social Gatherings with Friends and Family: Not on file  . Attends Religious Services: Not on file  . Active Member of Clubs or Organizations: Not on file  . Attends Archivist Meetings: Not on file  . Marital Status: Not on file     Family History: The patient's family history includes Healthy in her mother.  ROS:   Please see the history of present illness.    All other systems reviewed and are negative.  EKGs/Labs/Other Studies Reviewed:    The following studies were reviewed today:  EKG:  EKG is ordered today.  The ekg ordered today demonstrates normal sinus rhythm, normal EKG, personally reviewed.  Recent Labs: 10/31/2019: Creatinine, Ser 0.78; Hemoglobin 12.8; Platelets 353  Recent Lipid Panel No results found for: CHOL, TRIG, HDL, CHOLHDL, VLDL, LDLCALC, LDLDIRECT  Physical Exam:    VS:  BP 122/68   Pulse (!) 102   Ht 5' 1"  (1.549 m)   Wt 293 lb 12.8 oz (133.3 kg)   SpO2 98%   BMI 55.51 kg/m     Wt Readings from Last 3 Encounters:  01/28/20 293 lb 12.8 oz (133.3 kg)  10/31/19 291 lb 3.6 oz (132.1 kg)  10/24/19 297 lb 5 oz (134.9 kg)     GEN: obese, well developed in no acute  distress HEENT: Normal NECK: No JVD; No carotid bruits LYMPHATICS: No lymphadenopathy CARDIAC: RRR, no murmurs, rubs, gallops RESPIRATORY:  Clear to auscultation without rales, wheezing or rhonchi  ABDOMEN: Soft, non-tender, non-distended MUSCULOSKELETAL:  No edema; No deformity  SKIN: Warm and dry NEUROLOGIC:  Alert and oriented x 3 PSYCHIATRIC:  Normal affect   ASSESSMENT:    1. Palpitations   2. Sleep apnea, unspecified type    PLAN:    In order of problems listed above:  1. Palpitations 1. We will obtain 7-day Zio patch monitor 2. Echocardiogram to evaluate for systolic diastolic function 3. 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 Sleep apnea might be contributing to this, she is already scheduled for evaluation 5. We will obtain labs including CBC, CMP and TSH.   Medication Adjustments/Labs and Tests Ordered: Current medicines are reviewed at length with the patient today.  Concerns regarding medicines are outlined above.  Orders Placed This Encounter  Procedures  . TSH  . Comp Met (CMET)  . CBC  . Lipid Profile  . LONG TERM MONITOR (3-14 DAYS)  . EKG 12-Lead  . ECHOCARDIOGRAM COMPLETE   No orders of the defined types were placed in this encounter.   Patient Instructions  Medication Instructions:   Your physician recommends that you continue on your current medications as directed. Please refer to the Current Medication list given to you today.  *If you need a refill on your cardiac medications before your next appointment, please call your pharmacy*  Lab Work:  TODAY-CMET, CBC, TSH, AND LIPIDS  If you have labs (blood work) drawn today and your tests are completely normal, you will receive your results only by: Marland Kitchen MyChart Message (if you have MyChart) OR . A paper copy in the mail If you have any lab test that is abnormal or we need to change your treatment, we  will call you to review the results.   Testing/Procedures:  Your physician has requested that you have an echocardiogram. Echocardiography is a painless test that uses sound waves to create images of your heart. It provides your doctor with information about the size and shape of your heart and how well your heart's chambers and valves are working. This procedure takes approximately one hour. There are no restrictions for this procedure.   ZIO XT- Long Term Monitor Instructions   Your physician has requested you wear your ZIO patch monitor__14___days.   This is a single patch monitor.  [employer] supplies one patch monitor per enrollment.  Additional stickers are not available.   Please do not apply patch if you will be having a Nuclear Stress Test, Echocardiogram, Cardiac CT, MRI, or Chest Xray during the time frame you would be wearing the monitor. The patch cannot be worn during these tests.  You cannot remove and re-apply the ZIO XT patch monitor.   Your ZIO patch monitor will be sent USPS Priority mail from [facility] directly to your home address. The monitor may also be mailed to a PO BOX if home delivery is not available.   It may take 3-5 days to receive your monitor after you have been enrolled.   Once you have received you monitor, please review enclosed instructions.  Your monitor has already been registered assigning a specific monitor serial # to you.   Applying the monitor   Shave hair from upper left chest.   Hold abrader disc by orange tab.  Rub abrader in 40 strokes over left upper chest as indicated in your monitor instructions.   Clean area with 4 enclosed alcohol pads .  Use all pads to assure are is cleaned thoroughly.  Let dry.   Apply patch as indicated in monitor instructions.  Patch will be place under collarbone on left side of chest with arrow pointing upward.   Rub patch adhesive wings for 2 minutes.Remove white label marked "1".  Remove white label  marked "2".  Rub patch adhesive wings for 2 additional minutes.   While looking in a mirror, press and release button in center of patch.  A small green light will flash 3-4 times .  This will be  your only indicator the monitor has been turned on.     Do not shower for the first 24 hours.  You may shower after the first 24 hours.   Press button if you feel a symptom. You will hear a small click.  Record Date, Time and Symptom in the Patient Log Book.   When you are ready to remove patch, follow instructions on last 2 pages of Patient Log Book.  Stick patch monitor onto last page of Patient Log Book.   Place Patient Log Book in [place] box.  Use locking tab on box and tape box closed securely.  The Orange and [employer] has [employer] on it.  Please place in mailbox as soon as possible.  Your physician should have your test results approximately 7 days after the monitor has been mailed back to [facility].   Call [place] at [phone] if you have questions regarding your ZIO XT patch monitor.  Call them immediately if you see an orange light blinking on your monitor.   If your monitor falls off in less than 4 days contact our Monitor department at [phone].  If your monitor becomes loose or falls off after 4 days call [employer] at [phone] for suggestions on securing your monitor.    Follow-Up: At [facility], you and your health needs are our priority.  As part of our continuing mission to provide you with exceptional heart care, we have created designated Provider Care Teams.  These Care Teams include your primary Cardiologist (physician) and Advanced Practice Providers (APPs -  Physician Assistants and Nurse Practitioners) who all work together to provide you with the care you need, when you need it.  Your next appointment:   3 month(s)  The format for your next appointment:   In Person  Provider:   Ena Dawley, MD      Signed, Ena Dawley, MD  01/28/2020 9:46 AM    [place]

## 2020-01-28 ENCOUNTER — Encounter: Payer: Self-pay | Admitting: Cardiology

## 2020-01-28 ENCOUNTER — Encounter: Payer: Self-pay | Admitting: *Deleted

## 2020-01-28 ENCOUNTER — Ambulatory Visit: Payer: Self-pay | Admitting: Cardiology

## 2020-01-28 ENCOUNTER — Other Ambulatory Visit: Payer: Self-pay

## 2020-01-28 VITALS — BP 122/68 | HR 102 | Ht 61.0 in | Wt 293.8 lb

## 2020-01-28 DIAGNOSIS — R002 Palpitations: Secondary | ICD-10-CM | POA: Diagnosis not present

## 2020-01-28 DIAGNOSIS — G4730 Sleep apnea, unspecified: Secondary | ICD-10-CM | POA: Diagnosis not present

## 2020-01-28 DIAGNOSIS — E783 Hyperchylomicronemia: Secondary | ICD-10-CM | POA: Diagnosis not present

## 2020-01-28 LAB — COMPREHENSIVE METABOLIC PANEL
ALT: 12 IU/L (ref 0–32)
AST: 13 IU/L (ref 0–40)
Albumin/Globulin Ratio: 1.3 (ref 1.2–2.2)
Albumin: 4 g/dL (ref 3.8–4.8)
Alkaline Phosphatase: 86 IU/L (ref 39–117)
BUN/Creatinine Ratio: 14 (ref 9–23)
BUN: 10 mg/dL (ref 6–20)
Bilirubin Total: 0.4 mg/dL (ref 0.0–1.2)
CO2: 21 mmol/L (ref 20–29)
Calcium: 9.3 mg/dL (ref 8.7–10.2)
Chloride: 104 mmol/L (ref 96–106)
Creatinine, Ser: 0.73 mg/dL (ref 0.57–1.00)
GFR calc Af Amer: 127 mL/min/{1.73_m2} (ref >59)
GFR calc non Af Amer: 110 mL/min/{1.73_m2} (ref >59)
Globulin, Total: 3.2 g/dL (ref 1.5–4.5)
Glucose: 84 mg/dL (ref 65–99)
Potassium: 4.4 mmol/L (ref 3.5–5.2)
Sodium: 140 mmol/L (ref 134–144)
Total Protein: 7.2 g/dL (ref 6.0–8.5)

## 2020-01-28 LAB — CBC
Hematocrit: 41.3 % (ref 34.0–46.6)
Hemoglobin: 12.8 g/dL (ref 11.1–15.9)
MCH: 24.6 pg — ABNORMAL LOW (ref 26.6–33.0)
MCHC: 31 g/dL — ABNORMAL LOW (ref 31.5–35.7)
MCV: 79 fL (ref 79–97)
Platelets: 368 10*3/uL (ref 150–450)
RBC: 5.21 x10E6/uL (ref 3.77–5.28)
RDW: 13.5 % (ref 11.7–15.4)
WBC: 8.4 10*3/uL (ref 3.4–10.8)

## 2020-01-28 LAB — TSH: TSH: 1.54 u[IU]/mL (ref 0.450–4.500)

## 2020-01-28 LAB — LIPID PANEL
Chol/HDL Ratio: 4.3 ratio (ref 0.0–4.4)
Cholesterol, Total: 227 mg/dL — ABNORMAL HIGH (ref 100–199)
HDL: 53 mg/dL (ref >39)
LDL Chol Calc (NIH): 150 mg/dL — ABNORMAL HIGH (ref 0–99)
Triglycerides: 132 mg/dL (ref 0–149)
VLDL Cholesterol Cal: 24 mg/dL (ref 5–40)

## 2020-01-28 NOTE — Patient Instructions (Signed)
Medication Instructions:   Your physician recommends that you continue on your current medications as directed. Please refer to the Current Medication list given to you today.  *If you need a refill on your cardiac medications before your next appointment, please call your pharmacy*  Lab Work:  TODAY-CMET, CBC, TSH, AND LIPIDS  If you have labs (blood work) drawn today and your tests are completely normal, you will receive your results only by: Marland Kitchen MyChart Message (if you have MyChart) OR . A paper copy in the mail If you have any lab test that is abnormal or we need to change your treatment, we will call you to review the results.   Testing/Procedures:  Your physician has requested that you have an echocardiogram. Echocardiography is a painless test that uses sound waves to create images of your heart. It provides your doctor with information about the size and shape of your heart and how well your heart's chambers and valves are working. This procedure takes approximately one hour. There are no restrictions for this procedure.   ZIO XT- Long Term Monitor Instructions   Your physician has requested you wear your ZIO patch monitor__14___days.   This is a single patch monitor.  [employer] supplies one patch monitor per enrollment.  Additional stickers are not available.   Please do not apply patch if you will be having a Nuclear Stress Test, Echocardiogram, Cardiac CT, MRI, or Chest Xray during the time frame you would be wearing the monitor. The patch cannot be worn during these tests.  You cannot remove and re-apply the ZIO XT patch monitor.   Your ZIO patch monitor will be sent USPS Priority mail from [facility] directly to your home address. The monitor may also be mailed to a PO BOX if home delivery is not available.   It may take 3-5 days to receive your monitor after you have been enrolled.   Once you have received you monitor, please review enclosed instructions.  Your  monitor has already been registered assigning a specific monitor serial # to you.   Applying the monitor   Shave hair from upper left chest.   Hold abrader disc by orange tab.  Rub abrader in 40 strokes over left upper chest as indicated in your monitor instructions.   Clean area with 4 enclosed alcohol pads .  Use all pads to assure are is cleaned thoroughly.  Let dry.   Apply patch as indicated in monitor instructions.  Patch will be place under collarbone on left side of chest with arrow pointing upward.   Rub patch adhesive wings for 2 minutes.Remove white label marked "1".  Remove white label marked "2".  Rub patch adhesive wings for 2 additional minutes.   While looking in a mirror, press and release button in center of patch.  A small green light will flash 3-4 times .  This will be your only indicator the monitor has been turned on.     Do not shower for the first 24 hours.  You may shower after the first 24 hours.   Press button if you feel a symptom. You will hear a small click.  Record Date, Time and Symptom in the Patient Log Book.   When you are ready to remove patch, follow instructions on last 2 pages of Patient Log Book.  Stick patch monitor onto last page of Patient Log Book.   Place Patient Log Book in [place] box.  Use locking tab on box and tape box  closed securely.  The Orange and [employer] has [employer] on it.  Please place in mailbox as soon as possible.  Your physician should have your test results approximately 7 days after the monitor has been mailed back to [facility].   Call [facility] Customer Care at [phone] if you have questions regarding your ZIO XT patch monitor.  Call them immediately if you see an orange light blinking on your monitor.   If your monitor falls off in less than 4 days contact our Monitor department at [phone].  If your monitor becomes loose or falls off after 4 days call [employer] at [phone] for suggestions on  securing your monitor.    Follow-Up: At [facility], you and your health needs are our priority.  As part of our continuing mission to provide you with exceptional heart care, we have created designated Provider Care Teams.  These Care Teams include your primary Cardiologist (physician) and Advanced Practice Providers (APPs -  Physician Assistants and Nurse Practitioners) who all work together to provide you with the care you need, when you need it.  Your next appointment:   3 month(s)  The format for your next appointment:   In Person  Provider:   Tobias Alexander, MD

## 2020-01-28 NOTE — Progress Notes (Signed)
Patient ID: Anna Morrison, female   DOB: Apr 21, 1986, 34 y.o.   MRN: [id number] Patient enrolled for [employer] to mail a 14 day ZIO XT long term holter monitor to her home.

## 2020-01-30 ENCOUNTER — Telehealth: Payer: Self-pay | Admitting: Cardiology

## 2020-01-30 DIAGNOSIS — R0683 Snoring: Secondary | ICD-10-CM | POA: Diagnosis not present

## 2020-01-30 DIAGNOSIS — G4719 Other hypersomnia: Secondary | ICD-10-CM | POA: Diagnosis not present

## 2020-01-30 NOTE — Telephone Encounter (Signed)
New Message ° ° ° °Patient is returning call in reference to lab results. Please call.  °

## 2020-01-30 NOTE — Telephone Encounter (Signed)
Anna Masson, MD  01/28/2020 6:55 PM EST    All labs normal including thyroid function, elevated LDL - please advise to avoid foods high in cholesterol, fried foods, processed food, please send her Mediterranean style diet link, thank you   The patient has been notified of the result and verbalized understanding.  All questions (if any) were answered. Loa Socks, LPN 8/89/1694 5:03 AM

## 2020-02-07 ENCOUNTER — Other Ambulatory Visit (HOSPITAL_COMMUNITY): Payer: BC Managed Care – PPO

## 2020-02-25 ENCOUNTER — Ambulatory Visit (INDEPENDENT_AMBULATORY_CARE_PROVIDER_SITE_OTHER): Payer: BC Managed Care – PPO

## 2020-02-25 ENCOUNTER — Ambulatory Visit (HOSPITAL_COMMUNITY): Payer: BC Managed Care – PPO | Attending: Cardiology

## 2020-02-25 ENCOUNTER — Other Ambulatory Visit: Payer: Self-pay

## 2020-02-25 DIAGNOSIS — G4730 Sleep apnea, unspecified: Secondary | ICD-10-CM | POA: Insufficient documentation

## 2020-02-25 DIAGNOSIS — R002 Palpitations: Secondary | ICD-10-CM

## 2020-03-06 DIAGNOSIS — R030 Elevated blood-pressure reading, without diagnosis of hypertension: Secondary | ICD-10-CM | POA: Diagnosis not present

## 2020-03-06 DIAGNOSIS — Q011 Nasofrontal encephalocele: Secondary | ICD-10-CM | POA: Diagnosis not present

## 2020-03-06 DIAGNOSIS — Z6841 Body Mass Index (BMI) 40.0 and over, adult: Secondary | ICD-10-CM | POA: Diagnosis not present

## 2020-03-27 DIAGNOSIS — R002 Palpitations: Secondary | ICD-10-CM | POA: Diagnosis not present

## 2020-05-01 ENCOUNTER — Ambulatory Visit: Payer: BC Managed Care – PPO | Admitting: Cardiology

## 2020-06-09 ENCOUNTER — Encounter (HOSPITAL_COMMUNITY): Payer: Self-pay

## 2020-06-09 ENCOUNTER — Ambulatory Visit (HOSPITAL_COMMUNITY)
Admission: RE | Admit: 2020-06-09 | Discharge: 2020-06-09 | Disposition: A | Discharge status: Home / Self Care | Payer: BC Managed Care – PPO | Source: Ambulatory Visit | Attending: Family Medicine | Admitting: Family Medicine

## 2020-06-09 ENCOUNTER — Other Ambulatory Visit: Payer: Self-pay

## 2020-06-09 VITALS — BP 123/83 | HR 78 | Temp 98.6°F | Resp 17

## 2020-06-09 DIAGNOSIS — J029 Acute pharyngitis, unspecified: Secondary | ICD-10-CM

## 2020-06-09 DIAGNOSIS — Z7952 Long term (current) use of systemic steroids: Secondary | ICD-10-CM | POA: Insufficient documentation

## 2020-06-09 DIAGNOSIS — K219 Gastro-esophageal reflux disease without esophagitis: Secondary | ICD-10-CM | POA: Diagnosis not present

## 2020-06-09 DIAGNOSIS — Z79899 Other long term (current) drug therapy: Secondary | ICD-10-CM | POA: Insufficient documentation

## 2020-06-09 DIAGNOSIS — E669 Obesity, unspecified: Secondary | ICD-10-CM | POA: Diagnosis not present

## 2020-06-09 DIAGNOSIS — Z20822 Contact with and (suspected) exposure to covid-19: Secondary | ICD-10-CM | POA: Insufficient documentation

## 2020-06-09 DIAGNOSIS — H6983 Other specified disorders of Eustachian tube, bilateral: Secondary | ICD-10-CM | POA: Insufficient documentation

## 2020-06-09 DIAGNOSIS — Z791 Long term (current) use of non-steroidal anti-inflammatories (NSAID): Secondary | ICD-10-CM | POA: Insufficient documentation

## 2020-06-09 LAB — SARS CORONAVIRUS 2 (TAT 6-24 HRS): SARS Coronavirus 2: NEGATIVE

## 2020-06-09 MED ORDER — PREDNISONE 20 MG PO TABS: 40.0000 mg | ORAL_TABLET | Freq: Every day | ORAL | 0 refills | Status: AC

## 2020-06-09 MED ORDER — FLUTICASONE PROPIONATE 50 MCG/ACT NA SUSP: 1.0000 | Freq: Every day | NASAL | 0 refills | Status: DC

## 2020-06-09 MED ORDER — CETIRIZINE HCL 10 MG PO CAPS: 10.0000 mg | ORAL_CAPSULE | Freq: Every day | ORAL | 0 refills | Status: DC

## 2020-06-09 NOTE — ED Provider Notes (Signed)
[facility]    CSN: [id number] Arrival date & time: 06/09/20  1256      History   Chief Complaint Chief Complaint  Patient presents with  . Appointment  . Otalgia  . Scratchy Throat    HPI Anna Morrison is a 34 y.o. female history of encephalocele, GERD, presenting today for evaluation of ear pain and sore throat.  Patient reports that she has had some ear discomfort for approximately 1 week, today when she woke up ear pain had worsened especially on the right side.  She also woke up with some postnasal drainage and a scratchy throat.  She denies fevers.  Denies significant rhinorrhea.  Denies cough.  Denies any close sick contacts.  Denies Covid vaccination or prior Covid infection.  HPI  Past Medical History:  Diagnosis Date  . Anemia    low iron  . Chronic constipation   . Elevated BP without diagnosis of hypertension   . GERD (gastroesophageal reflux disease)   . Headache   . Nasofrontal encephalocele (HCC)    hx/notes 10/09/2018  . Nocturnal hypoxemia   . Obesity   . Palpitations   . Pneumonia   . Trichomonal vaginitis   . Vaginal yeast infection     Patient Active Problem List   Diagnosis Date Noted  . 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 COLONOSCOPY WITH PROPOFOL N/A 05/30/2018   Procedure: COLONOSCOPY WITH PROPOFOL;  Surgeon: Lynann Bologna, MD;  Location: [facility];  Service: Endoscopy;  Laterality: N/A;  . Endoscopic endonasal repair of ethmoidal encephalocele  10/09/2018  . ETHMOIDECTOMY N/A 10/09/2018   Procedure: ETHMOIDECTOMY;  Surgeon: [name], MD;  Location: [facility] OR;  Service: ENT;  Laterality: N/A;  . NASAL SINUS SURGERY N/A 10/09/2018   Procedure: NASAL ENDOSCOPY ,REPAIR OF FRONTAL ENCEPHALOCELE,HARVEST OF SEPTO BONE/CARTLEDGE;  Surgeon: [name], MD;  Location: [facility] OR;  Service: ENT;  Laterality:  N/A;  . NASAL SINUS SURGERY N/A 10/31/2019   Procedure: ENDOSCOPIC REPAIR OF CEREBROSPINAL FLUID LEAK;  Surgeon: [name], MD;  Location: [facility];  Service: ENT;  Laterality: N/A;  ENDOSCOPIC REPAIR OF CEREBROSPINAL FLUID LEAK  . PLACEMENT OF LUMBAR DRAIN N/A 10/31/2019   Procedure: PLACEMENT OF LUMBAR DRAIN;  Surgeon: Jadene Pierini, MD;  Location: [facility];  Service: Neurosurgery;  Laterality: N/A;  . REPAIR OF CEREBROSPINAL FLUID LEAK N/A 10/09/2018   Procedure: Endoscopic endonasal repair of ethmoidal encephalocele;  Surgeon: Jadene Pierini, MD;  Location: [facility];  Service: Neurosurgery;  Laterality: N/A;  . REPAIR OF CEREBROSPINAL FLUID LEAK N/A 10/31/2019   Procedure: Endonasal repair of cerebrospinal fluid leak with placement of lumbar drain;  Surgeon: Jadene Pierini, MD;  Location: [facility];  Service: Neurosurgery;  Laterality: N/A;  Endonasal repair of cerebrospinal fluid leak with placement of lumbar drain    OB History   No obstetric history on file.      Home Medications    Prior to Admission medications   Medication Sig Start Date End Date Taking? Authorizing Provider  Cetirizine HCl 10 MG CAPS Take 1 capsule (10 mg total) by mouth daily. 06/09/20   Bear Osten C, PA-C  Cyanocobalamin (VITAMIN B 12 PO) Take by mouth. Chew one gummy by mouth daily    [provider]  docusate sodium (COLACE) 100 MG capsule Take 100 mg by mouth daily.    [provider]  fluticasone (FLONASE) 50 MCG/ACT nasal spray Place 1-2 sprays into both nostrils daily for 7 days. 06/09/20 06/16/20  Adria Costley C, PA-C  ibuprofen (ADVIL) 800 MG tablet as needed. 01/23/20   [provider]  IRON PO Take 1 tablet by mouth daily.    [provider]  predniSONE (DELTASONE) 20 MG tablet Take 2 tablets (40 mg total) by mouth daily with breakfast for 5 days. 06/09/20 06/14/20  Camyla Camposano, Junius Creamer, PA-C    Family History Family History  Problem Relation Age of Onset    . Healthy Mother     Social History Social History   Tobacco Use  . Smoking status: Never Smoker  . Smokeless tobacco: Never Used  Vaping Use  . Vaping Use: Never used  Substance Use Topics  . Alcohol use: Yes    Comment: 10/09/2018 "couple glasses of wine/month"  . Drug use: Never     Allergies   Patient has no known allergies.   Review of Systems Review of Systems  Constitutional: Negative for activity change, appetite change, chills, fatigue and fever.  HENT: Positive for congestion, ear pain and sore throat. Negative for rhinorrhea, sinus pressure and trouble swallowing.   Eyes: Negative for discharge and redness.  Respiratory: Negative for cough, chest tightness and shortness of breath.   Cardiovascular: Negative for chest pain.  Gastrointestinal: Negative for abdominal pain, diarrhea, nausea and vomiting.  Musculoskeletal: Negative for myalgias.  Skin: Negative for rash.  Neurological: Negative for dizziness, light-headedness and headaches.     Physical Exam Triage Vital Signs ED Triage Vitals  Enc Vitals Group     BP 06/09/20 1324 123/83     Pulse Rate 06/09/20 1324 78     Resp 06/09/20 1324 17     Temp 06/09/20 1324 98.6 F (37 C)     Temp Source 06/09/20 1324 Oral     SpO2 06/09/20 1324 96 %     Weight --      Height --      Head Circumference --      Peak Flow --      Pain Score 06/09/20 1325 6     Pain Loc --      Pain Edu? --      Excl. in GC? --    No data found.  Updated Vital Signs BP 123/83 (BP Location: Right Arm)   Pulse 78   Temp 98.6 F (37 C) (Oral)   Resp 17   SpO2 96%   Visual Acuity Right Eye Distance:   Left Eye Distance:   Bilateral Distance:    Right Eye Near:   Left Eye Near:    Bilateral Near:     Physical Exam Vitals and nursing note reviewed.  Constitutional:      Appearance: She is well-developed.     Comments: No acute distress  HENT:     Head: Normocephalic and atraumatic.     Ears:     Comments:  Bilateral ears without tenderness to palpation of external auricle, tragus and mastoid, EAC's without erythema or swelling, TM's with good bony landmarks and cone of light. Non erythematous.     Nose: Nose normal.     Comments: Nasal mucosa mildly erythematous, superior turbinates swollen and inflamed bilaterally    Mouth/Throat:     Comments: Oral mucosa pink and moist, no tonsillar enlargement or exudate. Posterior pharynx patent and nonerythematous, no uvula deviation or swelling. Normal phonation.  Eyes:  Conjunctiva/sclera: Conjunctivae normal.  Cardiovascular:     Rate and Rhythm: Normal rate.  Pulmonary:     Effort: Pulmonary effort is normal. No respiratory distress.     Comments: Breathing comfortably at rest, CTABL, no wheezing, rales or other adventitious sounds auscultated Abdominal:     General: There is no distension.  Musculoskeletal:        General: Normal range of motion.     Cervical back: Neck supple.  Skin:    General: Skin is warm and dry.  Neurological:     Mental Status: She is alert and oriented to person, place, and time.      UC Treatments / Results  Labs (all labs ordered are listed, but only abnormal results are displayed) Labs Reviewed  SARS CORONAVIRUS 2 (TAT 6-24 HRS)    EKG   Radiology No results found.  Procedures Procedures (including critical care time)  Medications Ordered in UC Medications - No data to display  Initial Impression / Assessment and Plan / UC Course  I have reviewed the triage vital signs and the nursing notes.  Pertinent labs & imaging results that were available during my care of the patient were reviewed by me and considered in my medical decision making (see chart for details).    Covid PCR pending No sign of otitis media, treating for eustachian tube dysfunction with 5-day course of prednisone, Flonase and cetirizine.  Continue to monitor,Discussed strict return precautions. Patient verbalized  understanding and is agreeable with plan.  Final Clinical Impressions(s) / UC Diagnoses   Final diagnoses:  Eustachian tube dysfunction, bilateral  Sore throat     Discharge Instructions     Covid test pending, monitor my chart for results Begin daily cetirizine, Flonase nasal spray 1 to 2 spray in each nostril daily Begin prednisone 40 mg daily for 5 days Follow up if not improving or worsening   ED Prescriptions    Medication Sig Dispense Auth. Provider   predniSONE (DELTASONE) 20 MG tablet Take 2 tablets (40 mg total) by mouth daily with breakfast for 5 days. 10 tablet Jakia Kennebrew C, PA-C   fluticasone (FLONASE) 50 MCG/ACT nasal spray Place 1-2 sprays into both nostrils daily for 7 days. 1 g Skylie Hiott C, PA-C   Cetirizine HCl 10 MG CAPS Take 1 capsule (10 mg total) by mouth daily. 15 capsule Latish Toutant, Goldenrod C, PA-C     PDMP not reviewed this encounter.   Janith Lima, PA-C 06/09/20 1418

## 2020-06-09 NOTE — Discharge Instructions (Signed)
Covid test pending, monitor my chart for results Begin daily cetirizine, Flonase nasal spray 1 to 2 spray in each nostril daily Begin prednisone 40 mg daily for 5 days Follow up if not improving or worsening

## 2020-06-09 NOTE — ED Triage Notes (Signed)
Pt presents with bilateral ear pain and scratchy throat for a little over a week.

## 2020-07-13 ENCOUNTER — Encounter (HOSPITAL_COMMUNITY): Payer: Self-pay

## 2020-07-13 ENCOUNTER — Ambulatory Visit (HOSPITAL_COMMUNITY)
Admission: EM | Admit: 2020-07-13 | Discharge: 2020-07-13 | Disposition: A | Discharge status: Home / Self Care | Payer: BC Managed Care – PPO | Attending: Family Medicine | Admitting: Family Medicine

## 2020-07-13 ENCOUNTER — Other Ambulatory Visit: Payer: Self-pay

## 2020-07-13 DIAGNOSIS — Z20822 Contact with and (suspected) exposure to covid-19: Secondary | ICD-10-CM | POA: Diagnosis not present

## 2020-07-13 DIAGNOSIS — Z79899 Other long term (current) drug therapy: Secondary | ICD-10-CM | POA: Diagnosis not present

## 2020-07-13 DIAGNOSIS — J039 Acute tonsillitis, unspecified: Secondary | ICD-10-CM | POA: Diagnosis not present

## 2020-07-13 LAB — SARS CORONAVIRUS 2 (TAT 6-24 HRS): SARS Coronavirus 2: NEGATIVE

## 2020-07-13 MED ORDER — AMOXICILLIN 500 MG PO CAPS: 500.0000 mg | ORAL_CAPSULE | Freq: Two times a day (BID) | ORAL | 0 refills | Status: AC

## 2020-07-13 MED ORDER — DEXAMETHASONE 10 MG/ML FOR PEDIATRIC ORAL USE
10.0000 mg | Freq: Once | INTRAMUSCULAR | Status: AC
  Administered 2020-07-13: 10 mg via ORAL

## 2020-07-13 MED ORDER — DEXAMETHASONE SODIUM PHOSPHATE 10 MG/ML IJ SOLN
INTRAMUSCULAR | Status: AC
  Filled 2020-07-13: qty 1

## 2020-07-13 MED ORDER — IBUPROFEN 800 MG PO TABS
800.0000 mg | ORAL_TABLET | Freq: Three times a day (TID) | ORAL | 0 refills | Status: DC
Start: 2020-07-13 — End: 2021-06-05

## 2020-07-13 NOTE — ED Triage Notes (Signed)
Pt presents to UC for sore throat x 1 day. Pt states pain is on right side, and right ear also feels sore and full. Pt denies difficulty swallowing. Pt denies runny nose, cough, congestion, fever, loss of taste or smell. Pt has been treating with tylenol with out relief. Pt requesting covid testing today.

## 2020-07-13 NOTE — Discharge Instructions (Signed)
We gave you a dose of Decadron to help with pain/swelling in throat Begin amoxicillin twice daily for the next 10 days to treat tonsillitis Use anti-inflammatories for pain/swelling. You may take up to 800 mg Ibuprofen every 8 hours with food. You may supplement Ibuprofen with Tylenol [phone] mg every 8 hours.   Please follow-up if any symptoms not improving or worsening

## 2020-07-14 NOTE — ED Provider Notes (Signed)
[facility]    CSN: [id number] Arrival date & time: 07/13/20  1021      History   Chief Complaint Chief Complaint  Patient presents with  . Sore Throat    HPI Anna Morrison is a 34 y.o. female presenting today for evaluation of sore throat.  Patient reports that she has had a sore throat for approximately 1 day.  Has had a lot of swelling pain especially on the right side.  Has associated ear pressure as well.  Denies rhinorrhea or cough.  Denies any known fevers.  Using Tylenol without relief.  HPI  Past Medical History:  Diagnosis Date  . Anemia    low iron  . Chronic constipation   . Elevated BP without diagnosis of hypertension   . GERD (gastroesophageal reflux disease)   . Headache   . Nasofrontal encephalocele (HCC)    hx/notes 10/09/2018  . Nocturnal hypoxemia   . Obesity   . Palpitations   . Pneumonia   . Trichomonal vaginitis   . Vaginal yeast infection     Patient Active Problem List   Diagnosis Date Noted  . 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 COLONOSCOPY WITH PROPOFOL N/A 05/30/2018   Procedure: COLONOSCOPY WITH PROPOFOL;  Surgeon: Lynann Bologna, MD;  Location: [facility];  Service: Endoscopy;  Laterality: N/A;  . Endoscopic endonasal repair of ethmoidal encephalocele  10/09/2018  . ETHMOIDECTOMY N/A 10/09/2018   Procedure: ETHMOIDECTOMY;  Surgeon: [name], MD;  Location: [facility] OR;  Service: ENT;  Laterality: N/A;  . NASAL SINUS SURGERY N/A 10/09/2018   Procedure: NASAL ENDOSCOPY ,REPAIR OF FRONTAL ENCEPHALOCELE,HARVEST OF SEPTO BONE/CARTLEDGE;  Surgeon: [name], MD;  Location: [facility] OR;  Service: ENT;  Laterality: N/A;  . NASAL SINUS SURGERY N/A 10/31/2019   Procedure: ENDOSCOPIC REPAIR OF CEREBROSPINAL FLUID LEAK;  Surgeon: [name], MD;  Location: [facility];  Service: ENT;  Laterality: N/A;  ENDOSCOPIC  REPAIR OF CEREBROSPINAL FLUID LEAK  . PLACEMENT OF LUMBAR DRAIN N/A 10/31/2019   Procedure: PLACEMENT OF LUMBAR DRAIN;  Surgeon: Jadene Pierini, MD;  Location: [facility];  Service: Neurosurgery;  Laterality: N/A;  . REPAIR OF CEREBROSPINAL FLUID LEAK N/A 10/09/2018   Procedure: Endoscopic endonasal repair of ethmoidal encephalocele;  Surgeon: Jadene Pierini, MD;  Location: [facility];  Service: Neurosurgery;  Laterality: N/A;  . REPAIR OF CEREBROSPINAL FLUID LEAK N/A 10/31/2019   Procedure: Endonasal repair of cerebrospinal fluid leak with placement of lumbar drain;  Surgeon: Jadene Pierini, MD;  Location: [facility];  Service: Neurosurgery;  Laterality: N/A;  Endonasal repair of cerebrospinal fluid leak with placement of lumbar drain    OB History   No obstetric history on file.      Home Medications    Prior to Admission medications   Medication Sig Start Date End Date Taking? Authorizing Provider  docusate sodium (COLACE) 100 MG capsule Take 100 mg by mouth daily.   Yes [provider]  IRON PO Take 1 tablet by mouth daily.   Yes [provider]  amoxicillin (AMOXIL) 500 MG capsule Take 1 capsule (500 mg total) by mouth 2 (two) times daily for 10 days. 07/13/20 07/23/20  Vegas Coffin C, PA-C  Cetirizine HCl 10 MG CAPS Take 1 capsule (10 mg total) by mouth daily. 06/09/20   Jaelan Rasheed C, PA-C  Cyanocobalamin (VITAMIN B 12 PO)  Take by mouth. Chew one gummy by mouth daily    [provider]  fluticasone (FLONASE) 50 MCG/ACT nasal spray Place 1-2 sprays into both nostrils daily for 7 days. 06/09/20 06/16/20  Nneka Blanda C, PA-C  ibuprofen (ADVIL) 800 MG tablet Take 1 tablet (800 mg total) by mouth 3 (three) times daily. 07/13/20   Luis Nickles, Junius Creamer, PA-C    Family History Family History  Problem Relation Age of Onset  . Healthy Mother     Social History Social History   Tobacco Use  . Smoking status: Never Smoker  . Smokeless tobacco: Never Used    Vaping Use  . Vaping Use: Never used  Substance Use Topics  . Alcohol use: Yes    Comment: 10/09/2018 "couple glasses of wine/month"  . Drug use: Never     Allergies   Patient has no known allergies.   Review of Systems Review of Systems  Constitutional: Negative for activity change, appetite change, chills, fatigue and fever.  HENT: Positive for sore throat. Negative for congestion, ear pain, rhinorrhea, sinus pressure and trouble swallowing.   Eyes: Negative for discharge and redness.  Respiratory: Negative for cough, chest tightness and shortness of breath.   Cardiovascular: Negative for chest pain.  Gastrointestinal: Negative for abdominal pain, diarrhea, nausea and vomiting.  Musculoskeletal: Negative for myalgias.  Skin: Negative for rash.  Neurological: Negative for dizziness, light-headedness and headaches.     Physical Exam Triage Vital Signs ED Triage Vitals  Enc Vitals Group     BP 07/13/20 1216 (!) 135/71     Pulse Rate 07/13/20 1216 88     Resp 07/13/20 1216 16     Temp 07/13/20 1216 98.2 F (36.8 C)     Temp Source 07/13/20 1216 Oral     SpO2 07/13/20 1216 98 %     Weight --      Height --      Head Circumference --      Peak Flow --      Pain Score 07/13/20 1217 8     Pain Loc --      Pain Edu? --      Excl. in GC? --    No data found.  Updated Vital Signs BP (!) 135/71 (BP Location: Right Arm)   Pulse 88   Temp 98.2 F (36.8 C) (Oral)   Resp 16   LMP 06/04/2020 (Approximate)   SpO2 98%   Visual Acuity Right Eye Distance:   Left Eye Distance:   Bilateral Distance:    Right Eye Near:   Left Eye Near:    Bilateral Near:     Physical Exam Vitals and nursing note reviewed.  Constitutional:      Appearance: She is well-developed.     Comments: No acute distress  HENT:     Head: Normocephalic and atraumatic.     Ears:     Comments: Bilateral ears without tenderness to palpation of external auricle, tragus and mastoid, EAC's without  erythema or swelling, TM's with good bony landmarks and cone of light. Non erythematous.     Nose: Nose normal.     Mouth/Throat:     Comments: Bilateral tonsils enlarged erythematous with exudate bilaterally, right slightly greater than left, uvula midline without swelling, no soft palate swelling Eyes:     Conjunctiva/sclera: Conjunctivae normal.  Cardiovascular:     Rate and Rhythm: Normal rate.  Pulmonary:     Effort: Pulmonary effort is normal. No respiratory distress.  Comments: Breathing comfortably at rest, CTABL, no wheezing, rales or other adventitious sounds auscultated  Abdominal:     General: There is no distension.  Musculoskeletal:        General: Normal range of motion.     Cervical back: Neck supple.  Skin:    General: Skin is warm and dry.  Neurological:     Mental Status: She is alert and oriented to person, place, and time.      UC Treatments / Results  Labs (all labs ordered are listed, but only abnormal results are displayed) Labs Reviewed  SARS CORONAVIRUS 2 (TAT 6-24 HRS)    EKG   Radiology No results found.  Procedures Procedures (including critical care time)  Medications Ordered in UC Medications  dexamethasone (DECADRON) 10 MG/ML injection for Pediatric ORAL use 10 mg (10 mg Oral Given 07/13/20 1247)    Initial Impression / Assessment and Plan / UC Course  I have reviewed the triage vital signs and the nursing notes.  Pertinent labs & imaging results that were available during my care of the patient were reviewed by me and considered in my medical decision making (see chart for details).     Treated for tonsillitis/strep, providing Decadron p.o. prior to discharge to help with swelling, continuing on amoxicillin and ibuprofen.  No sign of peritonsillar abscess at this time, but continue to monitor.Discussed strict return precautions. Patient verbalized understanding and is agreeable with plan.   Final Clinical Impressions(s) / UC  Diagnoses   Final diagnoses:  Tonsillitis     Discharge Instructions     We gave you a dose of Decadron to help with pain/swelling in throat Begin amoxicillin twice daily for the next 10 days to treat tonsillitis Use anti-inflammatories for pain/swelling. You may take up to 800 mg Ibuprofen every 8 hours with food. You may supplement Ibuprofen with Tylenol [phone] mg every 8 hours.   Please follow-up if any symptoms not improving or worsening   ED Prescriptions    Medication Sig Dispense Auth. Provider   amoxicillin (AMOXIL) 500 MG capsule Take 1 capsule (500 mg total) by mouth 2 (two) times daily for 10 days. 20 capsule Lavanna Rog C, PA-C   ibuprofen (ADVIL) 800 MG tablet Take 1 tablet (800 mg total) by mouth 3 (three) times daily. 21 tablet Lawrie Tunks, Webster C, PA-C     PDMP not reviewed this encounter.   Lew Dawes, PA-C 07/14/20 1452

## 2020-07-16 DIAGNOSIS — Z13220 Encounter for screening for lipoid disorders: Secondary | ICD-10-CM | POA: Diagnosis not present

## 2020-07-16 DIAGNOSIS — L659 Nonscarring hair loss, unspecified: Secondary | ICD-10-CM | POA: Diagnosis not present

## 2020-07-16 DIAGNOSIS — D649 Anemia, unspecified: Secondary | ICD-10-CM | POA: Diagnosis not present

## 2020-07-16 DIAGNOSIS — Z0000 Encounter for general adult medical examination without abnormal findings: Secondary | ICD-10-CM | POA: Diagnosis not present

## 2020-07-16 DIAGNOSIS — R030 Elevated blood-pressure reading, without diagnosis of hypertension: Secondary | ICD-10-CM | POA: Diagnosis not present

## 2020-07-16 DIAGNOSIS — Z23 Encounter for immunization: Secondary | ICD-10-CM | POA: Diagnosis not present

## 2020-07-17 DIAGNOSIS — R519 Headache, unspecified: Secondary | ICD-10-CM | POA: Diagnosis not present

## 2020-08-07 DIAGNOSIS — Z6841 Body Mass Index (BMI) 40.0 and over, adult: Secondary | ICD-10-CM | POA: Diagnosis not present

## 2020-08-07 DIAGNOSIS — Z01419 Encounter for gynecological examination (general) (routine) without abnormal findings: Secondary | ICD-10-CM | POA: Diagnosis not present

## 2020-09-10 DIAGNOSIS — G43719 Chronic migraine without aura, intractable, without status migrainosus: Secondary | ICD-10-CM | POA: Diagnosis not present

## 2020-09-10 DIAGNOSIS — Z79899 Other long term (current) drug therapy: Secondary | ICD-10-CM | POA: Diagnosis not present

## 2020-09-10 DIAGNOSIS — Z049 Encounter for examination and observation for unspecified reason: Secondary | ICD-10-CM | POA: Diagnosis not present

## 2020-09-24 DIAGNOSIS — G43719 Chronic migraine without aura, intractable, without status migrainosus: Secondary | ICD-10-CM | POA: Diagnosis not present

## 2020-11-07 ENCOUNTER — Institutional Professional Consult (permissible substitution): Payer: BC Managed Care – PPO | Admitting: Pulmonary Disease

## 2021-01-31 ENCOUNTER — Ambulatory Visit (HOSPITAL_COMMUNITY): Payer: Self-pay

## 2021-02-01 ENCOUNTER — Other Ambulatory Visit: Payer: Self-pay

## 2021-02-01 ENCOUNTER — Ambulatory Visit (INDEPENDENT_AMBULATORY_CARE_PROVIDER_SITE_OTHER): Payer: BC Managed Care – PPO

## 2021-02-01 ENCOUNTER — Ambulatory Visit (HOSPITAL_COMMUNITY)
Admission: RE | Admit: 2021-02-01 | Discharge: 2021-02-01 | Disposition: A | Discharge status: Home / Self Care | Payer: BC Managed Care – PPO | Source: Ambulatory Visit | Attending: Student | Admitting: Student

## 2021-02-01 VITALS — BP 138/101 | HR 106 | Temp 98.3°F | Resp 20

## 2021-02-01 DIAGNOSIS — J209 Acute bronchitis, unspecified: Secondary | ICD-10-CM

## 2021-02-01 DIAGNOSIS — R0602 Shortness of breath: Secondary | ICD-10-CM | POA: Diagnosis not present

## 2021-02-01 DIAGNOSIS — R062 Wheezing: Secondary | ICD-10-CM | POA: Diagnosis not present

## 2021-02-01 MED ORDER — PREDNISONE 10 MG (21) PO TBPK
ORAL_TABLET | Freq: Every day | ORAL | 0 refills | Status: DC
Start: 2021-02-01 — End: 2021-06-05

## 2021-02-01 MED ORDER — ALBUTEROL SULFATE HFA 108 (90 BASE) MCG/ACT IN AERS: 1.0000 | INHALATION_SPRAY | Freq: Four times a day (QID) | RESPIRATORY_TRACT | 0 refills | Status: DC | PRN

## 2021-02-01 NOTE — Discharge Instructions (Addendum)
-  Start the prednisone taper as directed.  -You can also use the albuterol inhaler as needed for cough and shortness of breath. -Seek additional medical attention if you develop chest pain, shortness of breath, any fever/chills, new abdominal pain, other symptoms, or if your symptoms persist.

## 2021-02-01 NOTE — ED Triage Notes (Signed)
Pt reports she has had a dry cough for 3 weeks . Pt also reports a Ha when coughing.

## 2021-02-01 NOTE — ED Provider Notes (Signed)
[facility]    CSN: [id number] Arrival date & time: 02/01/21  1053      History   Chief Complaint Chief Complaint  Patient presents with  . Cough    HPI Anna Morrison is a 35 y.o. female presenting with dry cough for 3 weeks.  History anemia, constipation, elevated blood pressure, GERD, headaches, nocturnal hypoxemia, obesity, palpitations, pneumonia, trichomonas, yeast infection.  States she had a cold 4 weeks ago, which improved on its own but then 1 week later she developed a dry hacking cough.  This cough has persisted for 3 weeks. Sometimes coughs so hard she feels short of breath. Denies fevers/chills, n/v/d, shortness of breath, chest pain,  congestion, facial pain, teeth pain, headaches, sore throat, loss of taste/smell, swollen lymph nodes, ear pain. Denies allergic component.   HPI  Past Medical History:  Diagnosis Date  . Anemia    low iron  . Chronic constipation   . Elevated BP without diagnosis of hypertension   . GERD (gastroesophageal reflux disease)   . Headache   . Nasofrontal encephalocele (HCC)    hx/notes 10/09/2018  . Nocturnal hypoxemia   . Obesity   . Palpitations   . Pneumonia   . Trichomonal vaginitis   . Vaginal yeast infection     Patient Active Problem List   Diagnosis Date Noted  . 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 COLONOSCOPY WITH PROPOFOL N/A 05/30/2018   Procedure: COLONOSCOPY WITH PROPOFOL;  Surgeon: Lynann Bologna, MD;  Location: [facility];  Service: Endoscopy;  Laterality: N/A;  . Endoscopic endonasal repair of ethmoidal encephalocele  10/09/2018  . ETHMOIDECTOMY N/A 10/09/2018   Procedure: ETHMOIDECTOMY;  Surgeon: [name], MD;  Location: [facility] OR;  Service: ENT;  Laterality: N/A;  . NASAL SINUS SURGERY N/A 10/09/2018   Procedure: NASAL ENDOSCOPY ,REPAIR OF FRONTAL  ENCEPHALOCELE,HARVEST OF SEPTO BONE/CARTLEDGE;  Surgeon: [name], MD;  Location: [facility] OR;  Service: ENT;  Laterality: N/A;  . NASAL SINUS SURGERY N/A 10/31/2019   Procedure: ENDOSCOPIC REPAIR OF CEREBROSPINAL FLUID LEAK;  Surgeon: [name], MD;  Location: [facility] OR;  Service: ENT;  Laterality: N/A;  ENDOSCOPIC REPAIR OF CEREBROSPINAL FLUID LEAK  . PLACEMENT OF LUMBAR DRAIN N/A 10/31/2019   Procedure: PLACEMENT OF LUMBAR DRAIN;  Surgeon: Jadene Pierini, MD;  Location: [facility];  Service: Neurosurgery;  Laterality: N/A;  . REPAIR OF CEREBROSPINAL FLUID LEAK N/A 10/09/2018   Procedure: Endoscopic endonasal repair of ethmoidal encephalocele;  Surgeon: Jadene Pierini, MD;  Location: [facility];  Service: Neurosurgery;  Laterality: N/A;  . REPAIR OF CEREBROSPINAL FLUID LEAK N/A 10/31/2019   Procedure: Endonasal repair of cerebrospinal fluid leak with placement of lumbar drain;  Surgeon: Jadene Pierini, MD;  Location: [facility] OR;  Service: Neurosurgery;  Laterality: N/A;  Endonasal repair of cerebrospinal fluid leak with placement of lumbar drain    OB History   No obstetric history on file.      Home Medications    Prior to Admission medications   Medication Sig Start Date End Date Taking? Authorizing Provider  albuterol (VENTOLIN HFA) 108 (90 Base) MCG/ACT inhaler Inhale 1-2 puffs into the lungs every 6 (six) hours as needed for wheezing or shortness of breath. 02/01/21  Yes Rhys Martini, PA-C  predniSONE (STERAPRED UNI-PAK 21 TAB) 10 MG (21) TBPK tablet Take by mouth daily. Take 6 tabs  by mouth daily  for 2 days, then 5 tabs for 2 days, then 4 tabs for 2 days, then 3 tabs for 2 days, 2 tabs for 2 days, then 1 tab by mouth daily for 2 days 02/01/21  Yes Rhys Martini, PA-C  Cetirizine HCl 10 MG CAPS Take 1 capsule (10 mg total) by mouth daily. 06/09/20   Wieters, Hallie C, PA-C  Cyanocobalamin (VITAMIN B 12 PO) Take by mouth. Chew one gummy by mouth daily    [provider]   docusate sodium (COLACE) 100 MG capsule Take 100 mg by mouth daily.    [provider]  fluticasone (FLONASE) 50 MCG/ACT nasal spray Place 1-2 sprays into both nostrils daily for 7 days. 06/09/20 06/16/20  Wieters, Hallie C, PA-C  ibuprofen (ADVIL) 800 MG tablet Take 1 tablet (800 mg total) by mouth 3 (three) times daily. 07/13/20   Wieters, Hallie C, PA-C  IRON PO Take 1 tablet by mouth daily.    [provider]    Family History Family History  Problem Relation Age of Onset  . Healthy Mother     Social History Social History   Tobacco Use  . Smoking status: Never Smoker  . Smokeless tobacco: Never Used  Vaping Use  . Vaping Use: Never used  Substance Use Topics  . Alcohol use: Yes    Comment: 10/09/2018 "couple glasses of wine/month"  . Drug use: Never     Allergies   Patient has no known allergies.   Review of Systems Review of Systems  Constitutional: Negative for appetite change, chills and fever.  HENT: Negative for congestion, ear pain, rhinorrhea, sinus pressure, sinus pain and sore throat.   Eyes: Negative for redness and visual disturbance.  Respiratory: Positive for cough and shortness of breath. Negative for chest tightness and wheezing.   Cardiovascular: Negative for chest pain and palpitations.  Gastrointestinal: Negative for abdominal pain, constipation, diarrhea, nausea and vomiting.  Genitourinary: Negative for dysuria, frequency and urgency.  Musculoskeletal: Negative for myalgias.  Neurological: Negative for dizziness, weakness and headaches.  Psychiatric/Behavioral: Negative for confusion.  All other systems reviewed and are negative.    Physical Exam Triage Vital Signs ED Triage Vitals  Enc Vitals Group     BP 02/01/21 1127 (!) 138/101     Pulse Rate 02/01/21 1127 (!) 106     Resp 02/01/21 1127 20     Temp 02/01/21 1127 98.3 F (36.8 C)     Temp Source 02/01/21 1127 Oral     SpO2 02/01/21 1127 100 %     Weight --       Height --      Head Circumference --      Peak Flow --      Pain Score 02/01/21 1130 0     Pain Loc --      Pain Edu? --      Excl. in GC? --    No data found.  Updated Vital Signs BP (!) 138/101 (BP Location: Right Arm)   Pulse (!) 106   Temp 98.3 F (36.8 C) (Oral)   Resp 20   LMP 11/01/2020   SpO2 100%   Visual Acuity Right Eye Distance:   Left Eye Distance:   Bilateral Distance:    Right Eye Near:   Left Eye Near:    Bilateral Near:     Physical Exam Vitals reviewed.  Constitutional:      General: She is not in acute distress.  Appearance: Normal appearance. She is not ill-appearing.  HENT:     Head: Normocephalic and atraumatic.     Right Ear: Hearing, tympanic membrane, ear canal and external ear normal. No swelling or tenderness. There is no impacted cerumen. No mastoid tenderness. Tympanic membrane is not perforated, erythematous, retracted or bulging.     Left Ear: Hearing, tympanic membrane, ear canal and external ear normal. No swelling or tenderness. There is no impacted cerumen. No mastoid tenderness. Tympanic membrane is not perforated, erythematous, retracted or bulging.     Nose:     Right Sinus: No maxillary sinus tenderness or frontal sinus tenderness.     Left Sinus: No maxillary sinus tenderness or frontal sinus tenderness.     Mouth/Throat:     Mouth: Mucous membranes are moist.     Pharynx: Uvula midline. No oropharyngeal exudate or posterior oropharyngeal erythema.     Tonsils: No tonsillar exudate.  Cardiovascular:     Rate and Rhythm: Normal rate and regular rhythm.     Heart sounds: Normal heart sounds.  Pulmonary:     Breath sounds: Normal air entry. Wheezing present. No decreased breath sounds, rhonchi or rales.     Comments: Scattered wheezes throughout  Chest:     Chest wall: No tenderness.  Abdominal:     General: Abdomen is flat. Bowel sounds are normal.     Tenderness: There is no abdominal tenderness. There is no guarding or  rebound.  Lymphadenopathy:     Cervical: No cervical adenopathy.  Neurological:     General: No focal deficit present.     Mental Status: She is alert and oriented to person, place, and time.  Psychiatric:        Attention and Perception: Attention and perception normal.        Mood and Affect: Mood and affect normal.        Behavior: Behavior normal. Behavior is cooperative.        Thought Content: Thought content normal.        Judgment: Judgment normal.      UC Treatments / Results  Labs (all labs ordered are listed, but only abnormal results are displayed) Labs Reviewed - No data to display  EKG   Radiology DG Chest 2 View  Result Date: 02/01/2021 CLINICAL DATA:  Shortness of breath, wheezing EXAM: CHEST - 2 VIEW COMPARISON:  05/22/2018 FINDINGS: The heart size and mediastinal contours are within normal limits. Both lungs are clear. The visualized skeletal structures are unremarkable. IMPRESSION: No acute abnormality of the lungs. Electronically Signed   By: Lauralyn PrimesAlex  Bibbey M.D.   On: 02/01/2021 12:39    Procedures Procedures (including critical care time)  Medications Ordered in UC Medications - No data to display  Initial Impression / Assessment and Plan / UC Course  I have reviewed the triage vital signs and the nursing notes.  Pertinent labs & imaging results that were available during my care of the patient were reviewed by me and considered in my medical decision making (see chart for details).      tachycardic but afebrile nontachypneic, oxygenating well on room air, scattered wheezes throughout. CXR No acute abnormality of the lungs.  Plan to treat for bronchitis with albuterol inhaler, prednisone taper as below. She is not a diabetic.   Declines covid test. Given duration of symptoms >3 weeks I am in agreement.   Return precautions- chest pain, shortness of breath, new/worsening fevers/chills, confusion, worsening of symptoms despite the above treatment  plan, etc.   Spent over 40 minutes obtaining H&P, performing physical, interpreting EKG, discussing results, treatment plan and plan for follow-up with patient. Patient agrees with plan.   This chart was dictated using voice recognition software, Dragon. Despite the best efforts of this provider to proofread and correct errors, errors may still occur which can change documentation meaning.   Final Clinical Impressions(s) / UC Diagnoses   Final diagnoses:  Acute bronchitis, unspecified organism     Discharge Instructions     -Start the prednisone taper as directed.  -You can also use the albuterol inhaler as needed for cough and shortness of breath. -Seek additional medical attention if you develop chest pain, shortness of breath, any fever/chills, new abdominal pain, other symptoms, or if your symptoms persist.     ED Prescriptions    Medication Sig Dispense Auth. Provider   predniSONE (STERAPRED UNI-PAK 21 TAB) 10 MG (21) TBPK tablet Take by mouth daily. Take 6 tabs by mouth daily  for 2 days, then 5 tabs for 2 days, then 4 tabs for 2 days, then 3 tabs for 2 days, 2 tabs for 2 days, then 1 tab by mouth daily for 2 days 42 tablet Rhys Martini, PA-C   albuterol (VENTOLIN HFA) 108 (90 Base) MCG/ACT inhaler Inhale 1-2 puffs into the lungs every 6 (six) hours as needed for wheezing or shortness of breath. 1 each Rhys Martini, PA-C     PDMP not reviewed this encounter.   Rhys Martini, PA-C 02/01/21 1327

## 2021-03-27 DIAGNOSIS — Z1152 Encounter for screening for COVID-19: Secondary | ICD-10-CM | POA: Diagnosis not present

## 2021-04-02 DIAGNOSIS — Z1152 Encounter for screening for COVID-19: Secondary | ICD-10-CM | POA: Diagnosis not present

## 2021-04-16 DIAGNOSIS — Z1152 Encounter for screening for COVID-19: Secondary | ICD-10-CM | POA: Diagnosis not present

## 2021-04-23 DIAGNOSIS — Z1152 Encounter for screening for COVID-19: Secondary | ICD-10-CM | POA: Diagnosis not present

## 2021-05-01 DIAGNOSIS — Z1152 Encounter for screening for COVID-19: Secondary | ICD-10-CM | POA: Diagnosis not present

## 2021-05-06 DIAGNOSIS — Z1152 Encounter for screening for COVID-19: Secondary | ICD-10-CM | POA: Diagnosis not present

## 2021-05-22 DIAGNOSIS — Z1152 Encounter for screening for COVID-19: Secondary | ICD-10-CM | POA: Diagnosis not present

## 2021-05-28 DIAGNOSIS — R030 Elevated blood-pressure reading, without diagnosis of hypertension: Secondary | ICD-10-CM | POA: Diagnosis not present

## 2021-05-28 DIAGNOSIS — Q011 Nasofrontal encephalocele: Secondary | ICD-10-CM | POA: Diagnosis not present

## 2021-05-28 DIAGNOSIS — Z6841 Body Mass Index (BMI) 40.0 and over, adult: Secondary | ICD-10-CM | POA: Diagnosis not present

## 2021-05-28 DIAGNOSIS — Z1152 Encounter for screening for COVID-19: Secondary | ICD-10-CM | POA: Diagnosis not present

## 2021-05-29 ENCOUNTER — Other Ambulatory Visit: Payer: Self-pay | Admitting: Neurological Surgery

## 2021-05-29 ENCOUNTER — Other Ambulatory Visit (HOSPITAL_COMMUNITY): Payer: Self-pay | Admitting: Neurological Surgery

## 2021-05-29 DIAGNOSIS — Q011 Nasofrontal encephalocele: Secondary | ICD-10-CM

## 2021-06-02 ENCOUNTER — Telehealth: Payer: Self-pay | Admitting: *Deleted

## 2021-06-02 NOTE — Telephone Encounter (Signed)
I s/w Dr. Izora Ribas and he has given ok to use 06/05/21 9 am time slot as a NEW PT . If the pt calls back and it is still open please offer that appt as I did not see anyother NEW Pt appt until Late July.

## 2021-06-02 NOTE — Telephone Encounter (Signed)
Previous pt of Dr. Lindaann Slough. Pt will need to establish with another gen card in our practice. I have left a message for the pt to call the office [phone] and let the operator know she is calling to schedule what will be a NEW PT APPT as she will need to est with another gen card. Pt needs pre op clearance.

## 2021-06-02 NOTE — Telephone Encounter (Signed)
   [facility] Pre-operative Risk Assessment    Patient Name: Anna Morrison  DOB: December 23, 1985  MRN: [id number]   HEARTCARE STAFF: - Please ensure there is not already an duplicate clearance open for this procedure. - Under Visit Info/Reason for Call, type in Other and utilize the format Clearance MM/DD/YY or Clearance TBD. Do not use dashes or single digits. - If request is for dental extraction, please clarify the # of teeth to be extracted. - If the patient is currently at the dentist's office, call Pre-Op APP to address. If the patient is not currently in the dentist office, please route to the Pre-Op pool  Request for surgical clearance:  What type of surgery is being performed? RIGHT VENTRICULOPERITONEAL SHUNT PLACEMENT   When is this surgery scheduled? TBD   What type of clearance is required (medical clearance vs. Pharmacy clearance to hold med vs. Both)? MEDICAL  Are there any medications that need to be held prior to surgery and how long? NONE LISTED    Practice name and name of physician performing surgery? [facility]; DR. Emelda Brothers   What is the office phone number? [phone]   7.   What is the office fax number? [place]: JESSICA  8.   Anesthesia type (None, local, MAC, general) ? GENERAL   Julaine Hua 06/02/2021, 2:07 PM  _________________________________________________________________   (provider comments below)

## 2021-06-02 NOTE — Telephone Encounter (Signed)
   Name: Anna Morrison  DOB: 27-Oct-1986  MRN: [id number]  Primary Cardiologist: None  Chart reviewed as part of pre-operative protocol coverage. Because of [name]'s past medical history and time since last visit, she will require a follow-up visit in order to better assess preoperative cardiovascular risk.  Pre-op covering staff: - Please schedule appointment and call patient to inform them. If patient already had an upcoming appointment within acceptable timeframe, please add "pre-op clearance" to the appointment notes so provider is aware. - Please contact requesting surgeon's office via preferred method (i.e, phone, fax) to inform them of need for appointment prior to surgery.  If applicable, this message will also be routed to pharmacy pool and/or primary cardiologist for input on holding anticoagulant/antiplatelet agent as requested below so that this information is available to the clearing provider at time of patient's appointment.   Georgie Chard, NP  06/02/2021, 2:24 PM

## 2021-06-04 NOTE — Telephone Encounter (Signed)
Per scheduler the pt would not be a New pt as they had seen Dr. Delton See previously and they are just establish with another cardiologist in the practice. Pt has appt 6/17 with Dr. Izora Ribas for pre op clearance. Previous Dr. Delton See pt. Will send FYI to requesting office pt has appt 6/17.

## 2021-06-04 NOTE — Progress Notes (Signed)
Cardiology Office Note:    Date:  06/05/2021   ID:  Anna Morrison, DOB 10/07/1986, MRN [id number]  PCP:  Farris Has, MD   [facility] Providers Cardiologist:  Christell Constant, MD     Referring MD: Farris Has, MD   CC: Transition to new cardiologist Preop for VP shunt  History of Present Illness:    Anna Morrison is a 35 y.o. female with a hx of Palpitations, OSA, Morbid Obesity, HLD, Prior endonasal/endoscopic anterior skull base leak repairs and lumbar drains last seen 01/28/20. In interim of this visit, patient had normal echo and ZioPatch.  Seen 06/05/21.  Patient notes that she is doing well.  Since last visit notes need surgery for the shunt changes.  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  There are no interval hospital/ED visit.    No chest pain or pressure.  No SOB/DOE and no PND/Orthopnea.  No weight gain or leg swelling.  No palpitations or syncope. Her prior symptoms resolved with therapy for acid reflux.   Past Medical History:  Diagnosis Date   Anemia    low iron   Chronic constipation    Elevated BP without diagnosis of hypertension    GERD (gastroesophageal reflux disease)    Headache    Nasofrontal encephalocele (HCC)    hx/notes 10/09/2018   Nocturnal hypoxemia    Obesity    Palpitations    Pneumonia    Trichomonal vaginitis    Vaginal yeast infection     Past Surgical History:  Procedure Laterality Date   COLONOSCOPY WITH PROPOFOL N/A 05/30/2018   Procedure: COLONOSCOPY WITH PROPOFOL;  Surgeon: Lynann Bologna, MD;  Location: [facility];  Service: Endoscopy;  Laterality: N/A;   Endoscopic endonasal repair of ethmoidal encephalocele  10/09/2018   ETHMOIDECTOMY N/A 10/09/2018   Procedure: ETHMOIDECTOMY;  Surgeon: [name], MD;  Location: [facility] OR;  Service: ENT;  Laterality: N/A;   NASAL SINUS SURGERY N/A 10/09/2018   Procedure: NASAL ENDOSCOPY ,REPAIR OF FRONTAL ENCEPHALOCELE,HARVEST  OF SEPTO BONE/CARTLEDGE;  Surgeon: [name], MD;  Location: [facility] OR;  Service: ENT;  Laterality: N/A;   NASAL SINUS SURGERY N/A 10/31/2019   Procedure: ENDOSCOPIC REPAIR OF CEREBROSPINAL FLUID LEAK;  Surgeon: [name], MD;  Location: [facility] OR;  Service: ENT;  Laterality: N/A;  ENDOSCOPIC REPAIR OF CEREBROSPINAL FLUID LEAK   PLACEMENT OF LUMBAR DRAIN N/A 10/31/2019   Procedure: PLACEMENT OF LUMBAR DRAIN;  Surgeon: Jadene Pierini, MD;  Location: [facility];  Service: Neurosurgery;  Laterality: N/A;   REPAIR OF CEREBROSPINAL FLUID LEAK N/A 10/09/2018   Procedure: Endoscopic endonasal repair of ethmoidal encephalocele;  Surgeon: Jadene Pierini, MD;  Location: [facility] OR;  Service: Neurosurgery;  Laterality: N/A;   REPAIR OF CEREBROSPINAL FLUID LEAK N/A 10/31/2019   Procedure: Endonasal repair of cerebrospinal fluid leak with placement of lumbar drain;  Surgeon: Jadene Pierini, MD;  Location: [facility] OR;  Service: Neurosurgery;  Laterality: N/A;  Endonasal repair of cerebrospinal fluid leak with placement of lumbar drain    Current Medications: Current Meds  Medication Sig   cetirizine (ZYRTEC) 10 MG tablet Take 10 mg by mouth as needed for allergies.   Cyanocobalamin (VITAMIN B 12 PO) Take by mouth. Chew one gummy by mouth daily   docusate sodium (COLACE) 100 MG capsule Take 100 mg by mouth daily.   fluticasone (FLONASE) 50 MCG/ACT nasal spray Place into both nostrils as needed for allergies or  rhinitis.   ibuprofen (ADVIL) 800 MG tablet Take 800 mg by mouth every 8 (eight) hours as needed.   IRON PO Take 1 tablet by mouth daily.   [DISCONTINUED] Cetirizine HCl 10 MG CAPS Take 1 capsule (10 mg total) by mouth daily. (Patient taking differently: Take 10 mg by mouth as needed.)   [DISCONTINUED] fluticasone (FLONASE) 50 MCG/ACT nasal spray Place 1-2 sprays into both nostrils daily for 7 days. (Patient taking differently: Place 1-2 sprays into both nostrils as needed.)   [DISCONTINUED] ibuprofen  (ADVIL) 800 MG tablet Take 1 tablet (800 mg total) by mouth 3 (three) times daily. (Patient taking differently: Take 800 mg by mouth as needed.)     Allergies:   Patient has no known allergies.   Social History   Socioeconomic History   Marital status: Single    Spouse name: Not on file   Number of children: 0   Years of education: Not on file   Highest education level: Not on file  Occupational History   Occupation: [employer]  Tobacco Use   Smoking status: Never   Smokeless tobacco: Never  Vaping Use   Vaping Use: Never used  Substance and Sexual Activity   Alcohol use: Yes    Comment: 10/09/2018 "couple glasses of wine/month"   Drug use: Never   Sexual activity: Yes    Birth control/protection: Condom  Other Topics Concern   Not on file  Social History Narrative   Not on file   Social Determinants of Health   Financial Resource Strain: Not on file  Food Insecurity: Not on file  Transportation Needs: Not on file  Physical Activity: Not on file  Stress: Not on file  Social Connections: Not on file    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 Possible heart problems NOS in father  ROS:   Please see the history of present illness.    Notes history of significant reflux in the past, this has improved.  All other systems reviewed and are negative.  EKGs/Labs/Other Studies Reviewed:    The following studies were reviewed today:  EKG:  EKG is  ordered today.  The ekg ordered today demonstrates  06/05/21 SR 92 WNL  Cardiac Event Monitoring: Date: 03/28/20 Results: Sinus bradycardia to sinus rhythm. No arrhythmias or pauses.   Normal 8 day Zio patch monitoring.  Transthoracic Echocardiogram: Date: 02/25/20 Results:  1. Left ventricular ejection fraction, by estimation, is 55 to 60%. The  left ventricle has normal function. The left ventricle has no regional  wall motion  abnormalities. Left ventricular diastolic parameters were  normal. The average left ventricular  global longitudinal strain is -18.5 %.   2. Right ventricular systolic function is normal. The right ventricular  size is normal. There is normal pulmonary artery systolic pressure.   3. The mitral valve is normal in structure. No evidence of mitral valve  regurgitation. No evidence of mitral stenosis.   4. The aortic valve is tricuspid. Aortic valve regurgitation is not  visualized. No aortic stenosis is present.   5. The inferior vena cava is normal in size with greater than 50%  respiratory variability, suggesting right atrial pressure of 3 mmHg.    Recent Labs: No results found for requested labs within last 8760 hours.  Recent Lipid Panel    Component Value Date/Time   CHOL 227 (H) 01/28/2020 1005   TRIG 132 01/28/2020 1005  HDL 53 01/28/2020 1005   CHOLHDL 4.3 01/28/2020 1005   LDLCALC 150 (H) 01/28/2020 1005    Risk Assessment/Calculations:     RCRI 0      Physical Exam:    VS:  BP 136/89   Pulse 92   Ht 5\' 1"  (1.549 m)   Wt (!) 144.7 kg   SpO2 99%   BMI 60.27 kg/m     Wt Readings from Last 3 Encounters:  06/05/21 (!) 144.7 kg  01/28/20 133.3 kg  10/31/19 132.1 kg     GEN: Obese female, well developed in no acute distress HEENT: Normal NECK: Difficult JVD assessment; thick neck; No carotid bruits LYMPHATICS: No lymphadenopathy CARDIAC: RRR, no murmurs, rubs, gallops (distant heart sounds) RESPIRATORY:  Clear to auscultation without rales, wheezing or rhonchi  ABDOMEN: Soft, non-tender, non-distended MUSCULOSKELETAL:  No edema; No deformity  SKIN: Warm and dry NEUROLOGIC:  Alert and oriented x 3 PSYCHIATRIC:  Normal affect   ASSESSMENT:    1. Preoperative cardiovascular examination   2. Morbid obesity (HCC)   3. OSA (obstructive sleep apnea)   4. Familial hypercholesterolemia    PLAN:    Preoperative Risk Assessment HLD- will discuss options for  therapy at next visit Morbid Obesity- working to get the gastric sleeve OSA - The Revised Cardiac Risk Index 0=0.4%: very low risk of perioperative myocardial infarction, pulmonary edema, ventricular fibrillation, cardiac arrest, or complete heart block.  - DASI score of 27 associated with 6 functional mets - No further cardiac testing is recommended prior to surgery.  - The patient may proceed to surgery at acceptable risk.   - Our service is available as needed in the peri-operative period.   - at next visit will discussed HLD and OSA in more detail  Six month follow up unless new symptoms or abnormal test results warranting change in plan    Medication Adjustments/Labs and Tests Ordered: Current medicines are reviewed at length with the patient today.  Concerns regarding medicines are outlined above.  Orders Placed This Encounter  Procedures   EKG 12-Lead    No orders of the defined types were placed in this encounter.      Signed, 13/11/20, MD  06/05/2021 10:43 AM    [facility]

## 2021-06-05 ENCOUNTER — Ambulatory Visit: Payer: BC Managed Care – PPO | Admitting: Internal Medicine

## 2021-06-05 ENCOUNTER — Encounter: Payer: Self-pay | Admitting: Internal Medicine

## 2021-06-05 ENCOUNTER — Other Ambulatory Visit: Payer: Self-pay

## 2021-06-05 VITALS — BP 136/89 | HR 92 | Ht 61.0 in | Wt 319.0 lb

## 2021-06-05 DIAGNOSIS — G4733 Obstructive sleep apnea (adult) (pediatric): Secondary | ICD-10-CM | POA: Insufficient documentation

## 2021-06-05 DIAGNOSIS — E7801 Familial hypercholesterolemia: Secondary | ICD-10-CM

## 2021-06-05 DIAGNOSIS — Z01810 Encounter for preprocedural cardiovascular examination: Secondary | ICD-10-CM | POA: Diagnosis not present

## 2021-06-05 NOTE — Patient Instructions (Signed)
Medication Instructions:  Your physician recommends that you continue on your current medications as directed. Please refer to the Current Medication list given to you today.  *If you need a refill on your cardiac medications before your next appointment, please call your pharmacy*   Lab Work: NONE If you have labs (blood work) drawn today and your tests are completely normal, you will receive your results only by: . MyChart Message (if you have MyChart) OR . A paper copy in the mail If you have any lab test that is abnormal or we need to change your treatment, we will call you to review the results.   Testing/Procedures: NONE   Follow-Up: At [facility], you and your health needs are our priority.  As part of our continuing mission to provide you with exceptional heart care, we have created designated Provider Care Teams.  These Care Teams include your primary Cardiologist (physician) and Advanced Practice Providers (APPs -  Physician Assistants and Nurse Practitioners) who all work together to provide you with the care you need, when you need it.  We recommend signing up for the patient portal called "MyChart".  Sign up information is provided on this After Visit Summary.  MyChart is used to connect with patients for Virtual Visits (Telemedicine).  Patients are able to view lab/test results, encounter notes, upcoming appointments, etc.  Non-urgent messages can be sent to your provider as well.   To learn more about what you can do with MyChart, go to [email or web address].    Your next appointment:   6 month(s)  The format for your next appointment:   In Person  Provider:   You may see Mahesh A Chandrasekhar, MD or one of the following Advanced Practice Providers on your designated Care Team:    Dayna Dunn, PA-C  Michele Lenze, PA-C       

## 2021-06-06 DIAGNOSIS — Z1152 Encounter for screening for COVID-19: Secondary | ICD-10-CM | POA: Diagnosis not present

## 2021-06-08 ENCOUNTER — Other Ambulatory Visit (HOSPITAL_COMMUNITY): Payer: Self-pay | Admitting: Neurological Surgery

## 2021-06-08 ENCOUNTER — Ambulatory Visit (HOSPITAL_COMMUNITY)
Admission: RE | Admit: 2021-06-08 | Discharge: 2021-06-08 | Disposition: A | Discharge status: Home / Self Care | Payer: BC Managed Care – PPO | Source: Ambulatory Visit | Attending: Neurological Surgery | Admitting: Neurological Surgery

## 2021-06-08 ENCOUNTER — Encounter (HOSPITAL_COMMUNITY): Payer: Self-pay

## 2021-06-08 ENCOUNTER — Other Ambulatory Visit: Payer: Self-pay

## 2021-06-08 DIAGNOSIS — Q011 Nasofrontal encephalocele: Secondary | ICD-10-CM | POA: Diagnosis not present

## 2021-06-08 DIAGNOSIS — J321 Chronic frontal sinusitis: Secondary | ICD-10-CM | POA: Diagnosis not present

## 2021-06-08 DIAGNOSIS — Z982 Presence of cerebrospinal fluid drainage device: Secondary | ICD-10-CM | POA: Diagnosis not present

## 2021-06-08 DIAGNOSIS — Q019 Encephalocele, unspecified: Secondary | ICD-10-CM | POA: Diagnosis not present

## 2021-06-08 DIAGNOSIS — Z01818 Encounter for other preprocedural examination: Secondary | ICD-10-CM | POA: Diagnosis not present

## 2021-06-10 DIAGNOSIS — Z1152 Encounter for screening for COVID-19: Secondary | ICD-10-CM | POA: Diagnosis not present

## 2021-06-16 ENCOUNTER — Other Ambulatory Visit: Payer: Self-pay | Admitting: Neurological Surgery

## 2021-06-17 DIAGNOSIS — Z1152 Encounter for screening for COVID-19: Secondary | ICD-10-CM | POA: Diagnosis not present

## 2021-07-01 DIAGNOSIS — Z1152 Encounter for screening for COVID-19: Secondary | ICD-10-CM | POA: Diagnosis not present

## 2021-07-02 NOTE — Progress Notes (Signed)
Surgical Instructions    Your procedure is scheduled on 07/07/21.  Report to [facility] Main Entrance "A" at 12:30 P.M., then check in with the Admitting office.  Call this number if you have problems the morning of surgery:  [phone]   If you have any questions prior to your surgery date call [phone]: Open Monday-Friday 8am-4pm    Remember:  Do not eat after midnight the night before your surgery  You may drink clear liquids until 11:30 the morning of your surgery.   Clear liquids allowed are: Water, Non-Citrus Juices (without pulp), Carbonated Beverages, Clear Tea, Black Coffee Only, and Gatorade    Take these medicines the morning of surgery with A SIP OF WATER: acetaminophen (TYLENOL) - if needed    As of today, STOP taking any Aspirin (unless otherwise instructed by your surgeon) Aleve, Naproxen, Ibuprofen, Motrin, Advil, Goody's, BC's, all herbal medications, fish oil, and all vitamins.          Do not wear jewelry or makeup Do not wear lotions, powders, perfumes or deodorant. Do not shave 48 hours prior to surgery.  Do not bring valuables to the hospital. DO Not wear nail polish, gel polish, artificial nails, or any other type of covering on  natural nails including finger and toenails. If patients have artificial nails, gel coating, etc. that need to be removed by a nail salon please have this removed prior to surgery or surgery may need to be canceled/delayed if the surgeon/ anesthesia feels like the patient is unable to be adequately monitored.             [place] is not responsible for any belongings or valuables.  Do NOT Smoke (Tobacco/Vaping) or drink Alcohol 24 hours prior to your procedure If you use a CPAP at night, you may bring all equipment for your overnight stay.   Contacts, glasses, dentures or bridgework may not be worn into surgery, please bring cases for these belongings   For patients admitted to the hospital, discharge time will be  determined by your treatment team.   Patients discharged the day of surgery will not be allowed to drive home, and someone needs to stay with them for 24 hours.  ONLY 1 SUPPORT PERSON MAY BE PRESENT WHILE YOU ARE IN SURGERY. IF YOU ARE TO BE ADMITTED ONCE YOU ARE IN YOUR ROOM YOU WILL BE ALLOWED TWO (2) VISITORS.  Minor children may have two parents present. Special consideration for safety and communication needs will be reviewed on a case by case basis.  Special instructions:    Oral Hygiene is also important to reduce your risk of infection.  Remember - BRUSH YOUR TEETH THE MORNING OF SURGERY WITH YOUR REGULAR TOOTHPASTE   [facility]- Preparing For Surgery  Before surgery, you can play an important role. Because skin is not sterile, your skin needs to be as free of germs as possible. You can reduce the number of germs on your skin by washing with CHG (chlorahexidine gluconate) Soap before surgery.  CHG is an antiseptic cleaner which kills germs and bonds with the skin to continue killing germs even after washing.     Please do not use if you have an allergy to CHG or antibacterial soaps. If your skin becomes reddened/irritated stop using the CHG.  Do not shave (including legs and underarms) for at least 48 hours prior to first CHG shower. It is OK to shave your face.  Please follow these instructions carefully.  Shower the NIGHT BEFORE SURGERY and the MORNING OF SURGERY with CHG Soap.   If you chose to wash your hair, wash your hair first as usual with your normal shampoo. After you shampoo, rinse your hair and body thoroughly to remove the shampoo.  Then [employer] and genitals (private parts) with your normal soap and rinse thoroughly to remove soap.  After that Use CHG Soap as you would any other liquid soap. You can apply CHG directly to the skin and wash gently with a scrungie or a clean washcloth.   Apply the CHG Soap to your body ONLY FROM THE NECK DOWN.  Do not use on open  wounds or open sores. Avoid contact with your eyes, ears, mouth and genitals (private parts). Wash Face and genitals (private parts)  with your normal soap.   Wash thoroughly, paying special attention to the area where your surgery will be performed.  Thoroughly rinse your body with warm water from the neck down.  DO NOT shower/wash with your normal soap after using and rinsing off the CHG Soap.  Pat yourself dry with a CLEAN TOWEL.  Wear CLEAN PAJAMAS to bed the night before surgery  Place CLEAN SHEETS on your bed the night before your surgery  DO NOT SLEEP WITH PETS.   Day of Surgery:  Take a shower with CHG soap. Wear Clean/Comfortable clothing the morning of surgery Do not apply any deodorants/lotions.   Remember to brush your teeth WITH YOUR REGULAR TOOTHPASTE.   Please read over the following fact sheets that you were given.

## 2021-07-03 ENCOUNTER — Other Ambulatory Visit: Payer: Self-pay

## 2021-07-03 ENCOUNTER — Encounter (HOSPITAL_COMMUNITY)
Admission: RE | Admit: 2021-07-03 | Discharge: 2021-07-03 | Disposition: A | Discharge status: Home / Self Care | Payer: BC Managed Care – PPO | Source: Ambulatory Visit | Attending: Neurological Surgery | Admitting: Neurological Surgery

## 2021-07-03 ENCOUNTER — Encounter (HOSPITAL_COMMUNITY): Payer: Self-pay

## 2021-07-03 DIAGNOSIS — Z01812 Encounter for preprocedural laboratory examination: Secondary | ICD-10-CM | POA: Diagnosis not present

## 2021-07-03 DIAGNOSIS — Z20822 Contact with and (suspected) exposure to covid-19: Secondary | ICD-10-CM | POA: Insufficient documentation

## 2021-07-03 HISTORY — DX: Anxiety disorder, unspecified: F41.9

## 2021-07-03 LAB — CBC
HCT: 42.2 % (ref 36.0–46.0)
Hemoglobin: 12.8 g/dL (ref 12.0–15.0)
MCH: 25.4 pg — ABNORMAL LOW (ref 26.0–34.0)
MCHC: 30.3 g/dL (ref 30.0–36.0)
MCV: 83.7 fL (ref 80.0–100.0)
Platelets: 335 10*3/uL (ref 150–400)
RBC: 5.04 MIL/uL (ref 3.87–5.11)
RDW: 13.8 % (ref 11.5–15.5)
WBC: 10.6 10*3/uL — ABNORMAL HIGH (ref 4.0–10.5)
nRBC: 0 % (ref 0.0–0.2)

## 2021-07-03 LAB — SARS CORONAVIRUS 2 (TAT 6-24 HRS): SARS Coronavirus 2: NEGATIVE

## 2021-07-03 LAB — TYPE AND SCREEN
ABO/RH(D): B POS
Antibody Screen: NEGATIVE

## 2021-07-03 NOTE — Progress Notes (Signed)
PCP - Farris Has, MD Cardiologist - Riley Lam, MD  PPM/ICD - n/a Device Orders -  Rep Notified -   Chest x-ray - 02/01/21 EKG - 06/05/21 Stress Test - n/a ECHO - 02/25/20 Cardiac Cath - n/a  Sleep Study - n/a CPAP - n/a  Fasting Blood Sugar - n/a Checks Blood Sugar _____ times a day  Blood Thinner Instructions: n/a Aspirin Instructions: n/a  ERAS Protcol - clears until 1130 PRE-SURGERY Ensure or G2- n/a  COVID TEST- 07/03/21   Anesthesia review: yes, recent ECHO and had to have cardiac clearance for surgery  Patient denies shortness of breath, fever, cough and chest pain at PAT appointment   All instructions explained to the patient, with a verbal understanding of the material. Patient agrees to go over the instructions while at home for a better understanding. Patient also instructed to self quarantine after being tested for COVID-19. The opportunity to ask questions was provided.

## 2021-07-06 NOTE — Progress Notes (Signed)
Anesthesia Chart Review:  Case: [id number] Date/Time: 07/07/21 1413   Procedures:      Right Ventriculoperitoneal shunt (Right) - RM 20     APPLICATION OF CRANIAL NAVIGATION     LAPAROSCOPY DIAGNOSTIC (Right)   Anesthesia type: General   Pre-op diagnosis: Nasofrontal encephalocele   [facility] / [facility]   Surgeons: Jadene Pierini, MD; Stechschulte, Hyman Hopes, MD       DISCUSSION: Patient is a 35 year old female scheduled for the above procedure.  History includes never smoker, GERD, iron deficiency anemia, palpitations, nasofrontal encephalocele with headaches (s/p endoscopic endonasal repair, harvest of tensor fascia lata graft, sinonasal endoscopy for repair of CSF leak of the fovea ethmoidalis, ethmoidectomy 10/09/18; s/p lumbar drain placement with intrathecal fluorescein injection, endoscopic endonasal repair of anterior skull base CSF leak 10/31/19), nocturnal hypoxemia, elevated BP without HTN diagnosis. BMI is consistent with morbid obesity.   Preoperative cardiology visit with Dr. Izora Ribas on 06/05/21. She had unremarkable echo and ZioPatch in 2021 for evaluation of palpations:  "Preoperative Risk Assessment HLD- will discuss options for therapy at next visit Morbid Obesity- working to get the gastric sleeve OSA - The Revised Cardiac Risk Index 0=0.4%: very low risk of perioperative myocardial infarction, pulmonary edema, ventricular fibrillation, cardiac arrest, or complete heart block. - DASI score of 27 associated with 6 functional mets - No further cardiac testing is recommended prior to surgery. - The patient may proceed to surgery at acceptable risk.   - Our service is available as needed in the peri-operative period.   - at next visit will discussed HLD and OSA in more detail". Six month follow-up planned.  07/03/2021 presurgical COVID-19 test negative.  For urine pregnancy test on arrival.  Anesthesia team to evaluate on the day of surgery.    VS: BP  108/79   Pulse (!) 106   Temp 37.3 C (Oral)   Resp 17   Ht 5\' 1"  (1.549 m)   Wt (!) 144.8 kg   LMP 06/04/2021 (Exact Date)   SpO2 99%   BMI 60.33 kg/m    PROVIDERS: 06/06/2021, MD is PCP  Farris Has, MD is cardiologist, previously saw Karie Kirks, MD in 2021 for palpitations with reassuring echo and ZioPatch. 2022, MD is weight management provider--appears she contacted office in June to get re-established to start bariatric surgical process again.    LABS: Labs reviewed: Acceptable for surgery. (all labs ordered are listed, but only abnormal results are displayed)  Labs Reviewed  CBC - Abnormal; Notable for the following components:      Result Value   WBC 10.6 (*)    MCH 25.4 (*)    All other components within normal limits  SARS CORONAVIRUS 2 (TAT 6-24 HRS)  TYPE AND SCREEN     IMAGES: CT [place] 06/08/21: IMPRESSION: 1. Suspected defect in the right cribriform plate with opacified right ethmoid sinus, which could represent CSF and/or brain parenchyma in this patient with a known encephalocele status post repair. This appears smaller than on the priors. An MRI could better characterize the brain parenchyma and paranasal sinus CT could better evaluate the bony cribriform plate if clinically indicated. 2. Mild hypodensity in the adjacent inferior right frontal lobe, most likely gliosis and/or encephalomalacia. 3. Partially empty sella, which is often a normal anatomic variant but can be associated with idiopathic intracranial hypertension (pseudotumor cerebri).  CXR 02/01/21: FINDINGS: The heart size and mediastinal contours are within normal limits. Both lungs are  clear. The visualized skeletal structures are unremarkable. IMPRESSION: No acute abnormality of the lungs.   EKG: 06/05/21: NSR. Consider LAE.    CV: Cardiac Event Monitoring: Date: 02/25/20-03/06/20 Results: Sinus bradycardia to sinus rhythm. No arrhythmias or  pauses.   Normal 8 day Zio patch monitoring.   Transthoracic Echocardiogram: Date: 02/25/20 Results:  1. Left ventricular ejection fraction, by estimation, is 55 to 60%. The  left ventricle has normal function. The left ventricle has no regional  wall motion abnormalities. Left ventricular diastolic parameters were  normal. The average left ventricular  global longitudinal strain is -18.5 %.   2. Right ventricular systolic function is normal. The right ventricular  size is normal. There is normal pulmonary artery systolic pressure.   3. The mitral valve is normal in structure. No evidence of mitral valve  regurgitation. No evidence of mitral stenosis.   4. The aortic valve is tricuspid. Aortic valve regurgitation is not  visualized. No aortic stenosis is present.   5. The inferior vena cava is normal in size with greater than 50%  respiratory variability, suggesting right atrial pressure of 3 mmHg.     Past Medical History:  Diagnosis Date   Anemia    low iron   Anxiety    Chronic constipation    Elevated BP without diagnosis of hypertension    GERD (gastroesophageal reflux disease)    Headache    Nasofrontal encephalocele (HCC)    hx/notes 10/09/2018   Nocturnal hypoxemia    Obesity    Palpitations    Pneumonia    Trichomonal vaginitis    Vaginal yeast infection     Past Surgical History:  Procedure Laterality Date   COLONOSCOPY WITH PROPOFOL N/A 05/30/2018   Procedure: COLONOSCOPY WITH PROPOFOL;  Surgeon: Lynann Bologna, MD;  Location: [facility];  Service: Endoscopy;  Laterality: N/A;   Endoscopic endonasal repair of ethmoidal encephalocele  10/09/2018   ETHMOIDECTOMY N/A 10/09/2018   Procedure: ETHMOIDECTOMY;  Surgeon: [name], MD;  Location: [facility] OR;  Service: ENT;  Laterality: N/A;   NASAL SINUS SURGERY N/A 10/09/2018   Procedure: NASAL ENDOSCOPY ,REPAIR OF FRONTAL ENCEPHALOCELE,HARVEST OF SEPTO BONE/CARTLEDGE;  Surgeon: [name], MD;  Location: [facility] OR;   Service: ENT;  Laterality: N/A;   NASAL SINUS SURGERY N/A 10/31/2019   Procedure: ENDOSCOPIC REPAIR OF CEREBROSPINAL FLUID LEAK;  Surgeon: [name], MD;  Location: [facility] OR;  Service: ENT;  Laterality: N/A;  ENDOSCOPIC REPAIR OF CEREBROSPINAL FLUID LEAK   PLACEMENT OF LUMBAR DRAIN N/A 10/31/2019   Procedure: PLACEMENT OF LUMBAR DRAIN;  Surgeon: Jadene Pierini, MD;  Location: [facility] OR;  Service: Neurosurgery;  Laterality: N/A;   REPAIR OF CEREBROSPINAL FLUID LEAK N/A 10/09/2018   Procedure: Endoscopic endonasal repair of ethmoidal encephalocele;  Surgeon: Jadene Pierini, MD;  Location[facility];  Service: Neurosurgery;  Laterality: N/A;   REPAIR OF CEREBROSPINAL FLUID LEAK N/A 10/31/2019   Procedure: Endonasal repair of cerebrospinal fluid leak with placement of lumbar drain;  Surgeon: Jadene Pierini, MD;  Location[facility];  Service: Neurosurgery;  Laterality: N/A;  Endonasal repair of cerebrospinal fluid leak with placement of lumbar drain    MEDICATIONS:  acetaminophen (TYLENOL) 325 MG tablet   docusate sodium (COLACE) 100 MG capsule   ibuprofen (ADVIL) 400 MG tablet   IRON PO   No current facility-administered medications for this encounter.    Shonna Chock, PA-C Surgical Short Stay/Anesthesiology [facility] Phone [phone] [facility] Phone [phone] 07/06/2021 10:46 AM

## 2021-07-06 NOTE — Anesthesia Preprocedure Evaluation (Addendum)
Anesthesia Evaluation  Patient identified by MRN, date of birth, ID band Patient awake    Reviewed: Allergy & Precautions, NPO status , Patient's Chart, lab work & pertinent test results  Airway Mallampati: III  TM Distance: >3 FB Neck ROM: Full    Dental  (+) Chipped,    Pulmonary    Pulmonary exam normal breath sounds clear to auscultation       Cardiovascular negative cardio ROS Normal cardiovascular exam Rhythm:Regular Rate:Normal     Neuro/Psych  Headaches, Anxiety    GI/Hepatic negative GI ROS, Neg liver ROS,   Endo/Other  Morbid obesity  Renal/GU negative Renal ROS     Musculoskeletal negative musculoskeletal ROS (+)   Abdominal (+) + obese,   Peds  Hematology negative hematology ROS (+)   Anesthesia Other Findings Nasofrontal encephalocele  Reproductive/Obstetrics                             Anesthesia Physical Anesthesia Plan  ASA: 4  Anesthesia Plan: General   Post-op Pain Management:    Induction: Intravenous  PONV Risk Score and Plan: 4 or greater and Ondansetron, Dexamethasone, Treatment may vary due to age or medical condition, Propofol infusion and Amisulpride  Airway Management Planned: Oral ETT  Additional Equipment: Arterial line  Intra-op Plan:   Post-operative Plan: Extubation in OR  Informed Consent: I have reviewed the patients History and Physical, chart, labs and discussed the procedure including the risks, benefits and alternatives for the proposed anesthesia with the patient or authorized representative who has indicated his/her understanding and acceptance.     Dental advisory given  Plan Discussed with: CRNA  Anesthesia Plan Comments: (Reviewed PAT note written 07/06/2021 by Shonna Chock, PA-C. )      Anesthesia Quick Evaluation

## 2021-07-07 ENCOUNTER — Inpatient Hospital Stay (HOSPITAL_COMMUNITY): Payer: BC Managed Care – PPO | Admitting: Vascular Surgery

## 2021-07-07 ENCOUNTER — Inpatient Hospital Stay (HOSPITAL_COMMUNITY)
Admission: RE | Admit: 2021-07-07 | Discharge: 2021-07-08 | DRG: 032 | Disposition: A | Discharge status: Home / Self Care | Payer: BC Managed Care – PPO | Attending: Neurological Surgery | Admitting: Neurological Surgery

## 2021-07-07 ENCOUNTER — Encounter (HOSPITAL_COMMUNITY)
Admission: RE | Disposition: A | Discharge status: Home / Self Care | Payer: Self-pay | Source: Home / Self Care | Attending: Neurological Surgery

## 2021-07-07 ENCOUNTER — Inpatient Hospital Stay (HOSPITAL_COMMUNITY): Payer: BC Managed Care – PPO | Admitting: Certified Registered Nurse Anesthetist

## 2021-07-07 ENCOUNTER — Inpatient Hospital Stay (HOSPITAL_COMMUNITY): Payer: BC Managed Care – PPO

## 2021-07-07 ENCOUNTER — Encounter (HOSPITAL_COMMUNITY): Payer: Self-pay | Admitting: Neurological Surgery

## 2021-07-07 DIAGNOSIS — G9601 Cranial cerebrospinal fluid leak, spontaneous: Secondary | ICD-10-CM | POA: Diagnosis not present

## 2021-07-07 DIAGNOSIS — K5909 Other constipation: Secondary | ICD-10-CM | POA: Diagnosis not present

## 2021-07-07 DIAGNOSIS — G9389 Other specified disorders of brain: Secondary | ICD-10-CM | POA: Diagnosis not present

## 2021-07-07 DIAGNOSIS — Q011 Nasofrontal encephalocele: Secondary | ICD-10-CM

## 2021-07-07 DIAGNOSIS — I517 Cardiomegaly: Secondary | ICD-10-CM | POA: Diagnosis not present

## 2021-07-07 DIAGNOSIS — K219 Gastro-esophageal reflux disease without esophagitis: Secondary | ICD-10-CM | POA: Diagnosis not present

## 2021-07-07 DIAGNOSIS — G9600 Cerebrospinal fluid leak, unspecified: Secondary | ICD-10-CM | POA: Diagnosis not present

## 2021-07-07 DIAGNOSIS — Q010 Frontal encephalocele: Secondary | ICD-10-CM | POA: Diagnosis not present

## 2021-07-07 DIAGNOSIS — Z4682 Encounter for fitting and adjustment of non-vascular catheter: Secondary | ICD-10-CM | POA: Diagnosis not present

## 2021-07-07 DIAGNOSIS — G4733 Obstructive sleep apnea (adult) (pediatric): Secondary | ICD-10-CM | POA: Diagnosis not present

## 2021-07-07 DIAGNOSIS — Z982 Presence of cerebrospinal fluid drainage device: Secondary | ICD-10-CM

## 2021-07-07 DIAGNOSIS — Z6841 Body Mass Index (BMI) 40.0 and over, adult: Secondary | ICD-10-CM

## 2021-07-07 DIAGNOSIS — G919 Hydrocephalus, unspecified: Secondary | ICD-10-CM | POA: Diagnosis not present

## 2021-07-07 DIAGNOSIS — D509 Iron deficiency anemia, unspecified: Secondary | ICD-10-CM | POA: Diagnosis not present

## 2021-07-07 DIAGNOSIS — Z9889 Other specified postprocedural states: Secondary | ICD-10-CM | POA: Diagnosis not present

## 2021-07-07 DIAGNOSIS — E7801 Familial hypercholesterolemia: Secondary | ICD-10-CM | POA: Diagnosis not present

## 2021-07-07 HISTORY — PX: LAPAROSCOPY: SHX197

## 2021-07-07 HISTORY — PX: VENTRICULOPERITONEAL SHUNT: SHX204

## 2021-07-07 HISTORY — PX: APPLICATION OF CRANIAL NAVIGATION: SHX6578

## 2021-07-07 LAB — POCT PREGNANCY, URINE: Preg Test, Ur: NEGATIVE

## 2021-07-07 SURGERY — SHUNT INSERTION VENTRICULAR-PERITONEAL
Anesthesia: General | Site: Head | Laterality: Right

## 2021-07-07 MED ORDER — HYDROMORPHONE HCL 1 MG/ML IJ SOLN
0.5000 mg | INTRAMUSCULAR | Status: DC | PRN
Start: 2021-07-07 — End: 2021-07-08

## 2021-07-07 MED ORDER — OXYCODONE HCL 5 MG PO TABS
ORAL_TABLET | ORAL | Status: AC
  Filled 2021-07-07: qty 1

## 2021-07-07 MED ORDER — LIDOCAINE 2% (20 MG/ML) 5 ML SYRINGE
INTRAMUSCULAR | Status: DC | PRN
  Administered 2021-07-07: 60 mg via INTRAVENOUS

## 2021-07-07 MED ORDER — ORAL CARE MOUTH RINSE: 15.0000 mL | Freq: Once | OROMUCOSAL | Status: AC

## 2021-07-07 MED ORDER — CHLORHEXIDINE GLUCONATE 0.12 % MT SOLN
15.0000 mL | Freq: Once | OROMUCOSAL | Status: AC
  Administered 2021-07-07: 15 mL via OROMUCOSAL
  Filled 2021-07-07: qty 15

## 2021-07-07 MED ORDER — OXYCODONE HCL 5 MG PO TABS
5.0000 mg | ORAL_TABLET | Freq: Once | ORAL | Status: AC | PRN
  Administered 2021-07-07: 5 mg via ORAL

## 2021-07-07 MED ORDER — PHENYLEPHRINE HCL-NACL 10-0.9 MG/250ML-% IV SOLN
INTRAVENOUS | Status: DC | PRN
  Administered 2021-07-07: 10 ug/min via INTRAVENOUS

## 2021-07-07 MED ORDER — OXYCODONE HCL 5 MG/5ML PO SOLN
5.0000 mg | Freq: Once | ORAL | Status: AC | PRN
Start: 2021-07-07 — End: 2021-07-07

## 2021-07-07 MED ORDER — BUPIVACAINE HCL (PF) 0.25 % IJ SOLN
INTRAMUSCULAR | Status: AC
  Filled 2021-07-07: qty 30

## 2021-07-07 MED ORDER — PROMETHAZINE HCL 25 MG/ML IJ SOLN: 6.2500 mg | INTRAMUSCULAR | Status: DC | PRN

## 2021-07-07 MED ORDER — PROPOFOL 10 MG/ML IV BOLUS
INTRAVENOUS | Status: DC | PRN
  Administered 2021-07-07: 100 mg via INTRAVENOUS
  Administered 2021-07-07: 40 mg via INTRAVENOUS
  Administered 2021-07-07: 200 mg via INTRAVENOUS

## 2021-07-07 MED ORDER — FENTANYL CITRATE (PF) 250 MCG/5ML IJ SOLN
INTRAMUSCULAR | Status: AC
  Filled 2021-07-07: qty 5

## 2021-07-07 MED ORDER — THROMBIN 5000 UNITS EX SOLR
CUTANEOUS | Status: AC
  Filled 2021-07-07: qty 5000

## 2021-07-07 MED ORDER — ROCURONIUM BROMIDE 10 MG/ML (PF) SYRINGE
PREFILLED_SYRINGE | INTRAVENOUS | Status: DC | PRN
  Administered 2021-07-07: 100 mg via INTRAVENOUS

## 2021-07-07 MED ORDER — ACETAMINOPHEN 10 MG/ML IV SOLN
INTRAVENOUS | Status: AC
  Filled 2021-07-07: qty 100

## 2021-07-07 MED ORDER — CHLORHEXIDINE GLUCONATE CLOTH 2 % EX PADS: 6.0000 | MEDICATED_PAD | Freq: Once | CUTANEOUS | Status: DC

## 2021-07-07 MED ORDER — FENTANYL CITRATE (PF) 100 MCG/2ML IJ SOLN
INTRAMUSCULAR | Status: AC
  Filled 2021-07-07: qty 2

## 2021-07-07 MED ORDER — CEFAZOLIN IN SODIUM CHLORIDE 3-0.9 GM/100ML-% IV SOLN
3.0000 g | INTRAVENOUS | Status: AC
  Administered 2021-07-07: 3 g via INTRAVENOUS
  Filled 2021-07-07: qty 100

## 2021-07-07 MED ORDER — ONDANSETRON HCL 4 MG/2ML IJ SOLN
INTRAMUSCULAR | Status: DC | PRN
  Administered 2021-07-07: 4 mg via INTRAVENOUS

## 2021-07-07 MED ORDER — LIDOCAINE-EPINEPHRINE 1 %-1:100000 IJ SOLN
INTRAMUSCULAR | Status: AC
  Filled 2021-07-07: qty 1

## 2021-07-07 MED ORDER — FENTANYL CITRATE (PF) 250 MCG/5ML IJ SOLN
INTRAMUSCULAR | Status: DC | PRN
  Administered 2021-07-07: 50 ug via INTRAVENOUS
  Administered 2021-07-07: 100 ug via INTRAVENOUS

## 2021-07-07 MED ORDER — SUGAMMADEX SODIUM 200 MG/2ML IV SOLN
INTRAVENOUS | Status: DC | PRN
  Administered 2021-07-07: 300 mg via INTRAVENOUS

## 2021-07-07 MED ORDER — AMISULPRIDE (ANTIEMETIC) 5 MG/2ML IV SOLN: 10.0000 mg | Freq: Once | INTRAVENOUS | Status: DC | PRN

## 2021-07-07 MED ORDER — PROPOFOL 10 MG/ML IV BOLUS
INTRAVENOUS | Status: AC
  Filled 2021-07-07: qty 40

## 2021-07-07 MED ORDER — LIDOCAINE-EPINEPHRINE 1 %-1:100000 IJ SOLN
INTRAMUSCULAR | Status: DC | PRN
  Administered 2021-07-07: 4.5 mL

## 2021-07-07 MED ORDER — BACITRACIN ZINC 500 UNIT/GM EX OINT
TOPICAL_OINTMENT | CUTANEOUS | Status: DC | PRN
  Administered 2021-07-07: 1 via TOPICAL

## 2021-07-07 MED ORDER — FENTANYL CITRATE (PF) 100 MCG/2ML IJ SOLN
25.0000 ug | INTRAMUSCULAR | Status: DC | PRN
  Administered 2021-07-08: 50 ug via INTRAVENOUS

## 2021-07-07 MED ORDER — ACETAMINOPHEN 10 MG/ML IV SOLN
1000.0000 mg | Freq: Once | INTRAVENOUS | Status: DC | PRN
  Administered 2021-07-07: 1000 mg via INTRAVENOUS

## 2021-07-07 MED ORDER — HYDROCODONE-ACETAMINOPHEN 5-325 MG PO TABS: 1.0000 | ORAL_TABLET | ORAL | Status: DC | PRN

## 2021-07-07 MED ORDER — BACITRACIN ZINC 500 UNIT/GM EX OINT
TOPICAL_OINTMENT | CUTANEOUS | Status: AC
  Filled 2021-07-07: qty 28.35

## 2021-07-07 MED ORDER — MIDAZOLAM HCL 2 MG/2ML IJ SOLN
INTRAMUSCULAR | Status: AC
  Filled 2021-07-07: qty 2

## 2021-07-07 MED ORDER — DEXAMETHASONE SODIUM PHOSPHATE 10 MG/ML IJ SOLN
INTRAMUSCULAR | Status: DC | PRN
  Administered 2021-07-07: 10 mg via INTRAVENOUS

## 2021-07-07 MED ORDER — 0.9 % SODIUM CHLORIDE (POUR BTL) OPTIME
TOPICAL | Status: DC | PRN
  Administered 2021-07-07: 2000 mL

## 2021-07-07 MED ORDER — MIDAZOLAM HCL 2 MG/2ML IJ SOLN
INTRAMUSCULAR | Status: DC | PRN
  Administered 2021-07-07 (×2): 1 mg via INTRAVENOUS

## 2021-07-07 MED ORDER — BUPIVACAINE HCL 0.25 % IJ SOLN
INTRAMUSCULAR | Status: DC | PRN
  Administered 2021-07-07: 4.5 mL

## 2021-07-07 MED ORDER — THROMBIN 5000 UNITS EX SOLR: OROMUCOSAL | Status: DC | PRN

## 2021-07-07 MED ORDER — SODIUM CHLORIDE 0.9 % IV SOLN: INTRAVENOUS | Status: DC

## 2021-07-07 MED ORDER — FENTANYL CITRATE (PF) 100 MCG/2ML IJ SOLN
INTRAMUSCULAR | Status: DC | PRN
  Administered 2021-07-07 (×2): 50 ug via INTRAVENOUS

## 2021-07-07 SURGICAL SUPPLY — 63 items
ADH SKN CLS APL DERMABOND .7 (GAUZE/BANDAGES/DRESSINGS) ×1
BAG COUNTER SPONGE SURGICOUNT (BAG) ×8 IMPLANT
BAG SPNG CNTER NS LX DISP (BAG) ×2
BLADE CLIPPER SURG (BLADE) ×4 IMPLANT
BLADE SURG 11 STRL SS (BLADE) ×4 IMPLANT
BOOT SUTURE AID YELLOW STND (SUTURE) ×4 IMPLANT
BUR ACORN 9.0 PRECISION (BURR) ×4 IMPLANT
CANISTER SUCT 3000ML PPV (MISCELLANEOUS) ×4 IMPLANT
CHLORAPREP W/TINT 26 (MISCELLANEOUS) ×4 IMPLANT
DERMABOND ADVANCED (GAUZE/BANDAGES/DRESSINGS) ×1
DERMABOND ADVANCED .7 DNX12 (GAUZE/BANDAGES/DRESSINGS) ×3 IMPLANT
DRAPE HALF SHEET 40X57 (DRAPES) ×4 IMPLANT
DRAPE INCISE IOBAN 66X45 STRL (DRAPES) ×4 IMPLANT
DRAPE ORTHO SPLIT 77X108 STRL (DRAPES) ×8
DRAPE SURG ORHT 6 SPLT 77X108 (DRAPES) ×6 IMPLANT
DRAPE WARM FLUID 44X44 (DRAPES) ×4 IMPLANT
DURAPREP 26ML APPLICATOR (WOUND CARE) ×8 IMPLANT
ELECT REM PT RETURN 9FT ADLT (ELECTROSURGICAL) ×4
ELECTRODE REM PT RTRN 9FT ADLT (ELECTROSURGICAL) ×3 IMPLANT
GAUZE 4X4 16PLY ~~LOC~~+RFID DBL (SPONGE) ×4 IMPLANT
GLOVE SRG 8 PF TXTR STRL LF DI (GLOVE) ×3 IMPLANT
GLOVE SURG ENC MOIS LTX SZ7.5 (GLOVE) ×4 IMPLANT
GLOVE SURG LTX SZ7.5 (GLOVE) ×8 IMPLANT
GLOVE SURG UNDER LTX SZ7.5 (GLOVE) ×8 IMPLANT
GLOVE SURG UNDER POLY LF SZ8 (GLOVE) ×4
GOWN STRL REUS W/ TWL LRG LVL3 (GOWN DISPOSABLE) ×6 IMPLANT
GOWN STRL REUS W/ TWL XL LVL3 (GOWN DISPOSABLE) ×6 IMPLANT
GOWN STRL REUS W/TWL LRG LVL3 (GOWN DISPOSABLE) ×8
GOWN STRL REUS W/TWL XL LVL3 (GOWN DISPOSABLE) ×8
HEMOSTAT POWDER KIT SURGIFOAM (HEMOSTASIS) ×4 IMPLANT
KIT BASIN OR (CUSTOM PROCEDURE TRAY) ×8 IMPLANT
KIT TURNOVER KIT B (KITS) ×8 IMPLANT
MARKER SKIN DUAL TIP RULER LAB (MISCELLANEOUS) ×4 IMPLANT
MARKER SPHERE PSV REFLC 13MM (MARKER) ×12 IMPLANT
NEEDLE HYPO 22GX1.5 SAFETY (NEEDLE) ×4 IMPLANT
NEEDLE INSUFFLATION 14GA 120MM (NEEDLE) IMPLANT
PACK LAMINECTOMY NEURO (CUSTOM PROCEDURE TRAY) ×4 IMPLANT
PASSER CATH 65CM DISP (NEUROSURGERY SUPPLIES) ×4 IMPLANT
SCISSORS LAP 5X35 DISP (ENDOMECHANICALS) IMPLANT
SET IRRIG TUBING LAPAROSCOPIC (IRRIGATION / IRRIGATOR) IMPLANT
SET TUBE SMOKE EVAC HIGH FLOW (TUBING) ×4 IMPLANT
SHEATH PERITONEAL INTRO 46 (SHEATH) ×4 IMPLANT
SHEATH PERITONEAL INTRO 61 (SHEATH) ×4 IMPLANT
SLEEVE ENDOPATH XCEL 5M (ENDOMECHANICALS) ×8 IMPLANT
SPONGE T-LAP 4X18 ~~LOC~~+RFID (SPONGE) ×4 IMPLANT
STAPLER SKIN PROX WIDE 3.9 (STAPLE) ×4 IMPLANT
SUT ETHILON 3 0 FSL (SUTURE) IMPLANT
SUT MNCRL AB 4-0 PS2 18 (SUTURE) ×4 IMPLANT
SUT NURALON 4 0 TR CR/8 (SUTURE) IMPLANT
SUT SILK 0 TIES 10X30 (SUTURE) ×4 IMPLANT
SUT SILK 3 0 SH 30 (SUTURE) IMPLANT
SUT VIC AB 2-0 CP2 18 (SUTURE) ×4 IMPLANT
SUT VIC AB 3-0 SH 8-18 (SUTURE) ×8 IMPLANT
TOWEL GREEN STERILE (TOWEL DISPOSABLE) ×8 IMPLANT
TOWEL GREEN STERILE FF (TOWEL DISPOSABLE) ×4 IMPLANT
TRAY LAPAROSCOPIC MC (CUSTOM PROCEDURE TRAY) ×4 IMPLANT
TROCAR XCEL 12X100 BLDLESS (ENDOMECHANICALS) IMPLANT
TROCAR XCEL BLUNT TIP 100MML (ENDOMECHANICALS) IMPLANT
TROCAR XCEL NON-BLD 11X100MML (ENDOMECHANICALS) IMPLANT
TROCAR XCEL NON-BLD 5MMX100MML (ENDOMECHANICALS) ×4 IMPLANT
VALVE PROGRAM CERTAS SM ANTI (Valve) ×4 IMPLANT
WARMER LAPAROSCOPE (MISCELLANEOUS) IMPLANT
WATER STERILE IRR 1000ML POUR (IV SOLUTION) ×4 IMPLANT

## 2021-07-07 NOTE — H&P (Signed)
Admitting Physician: Hyman Hopes Tyja Gortney  Service: General surgery  CC: Needs VP shunt placement  Subjective   HPI: Anna Morrison is an 35 y.o. female who is here for VP shunt placement.  Past Medical History:  Diagnosis Date   Anemia    low iron   Anxiety    Chronic constipation    Elevated BP without diagnosis of hypertension    GERD (gastroesophageal reflux disease)    Headache    Nasofrontal encephalocele (HCC)    hx/notes 10/09/2018   Nocturnal hypoxemia    Obesity    Palpitations    Pneumonia    Trichomonal vaginitis    Vaginal yeast infection     Past Surgical History:  Procedure Laterality Date   COLONOSCOPY WITH PROPOFOL N/A 05/30/2018   Procedure: COLONOSCOPY WITH PROPOFOL;  Surgeon: Lynann Bologna, MD;  Location: [facility];  Service: Endoscopy;  Laterality: N/A;   Endoscopic endonasal repair of ethmoidal encephalocele  10/09/2018   ETHMOIDECTOMY N/A 10/09/2018   Procedure: ETHMOIDECTOMY;  Surgeon: [name], MD;  Location: [facility] OR;  Service: ENT;  Laterality: N/A;   NASAL SINUS SURGERY N/A 10/09/2018   Procedure: NASAL ENDOSCOPY ,REPAIR OF FRONTAL ENCEPHALOCELE,HARVEST OF SEPTO BONE/CARTLEDGE;  Surgeon: [name], MD;  Location: [facility] OR;  Service: ENT;  Laterality: N/A;   NASAL SINUS SURGERY N/A 10/31/2019   Procedure: ENDOSCOPIC REPAIR OF CEREBROSPINAL FLUID LEAK;  Surgeon: [name], MD;  Location: [facility] OR;  Service: ENT;  Laterality: N/A;  ENDOSCOPIC REPAIR OF CEREBROSPINAL FLUID LEAK   PLACEMENT OF LUMBAR DRAIN N/A 10/31/2019   Procedure: PLACEMENT OF LUMBAR DRAIN;  Surgeon: Jadene Pierini, MD;  [facility];  Service: Neurosurgery;  Laterality: N/A;   REPAIR OF CEREBROSPINAL FLUID LEAK N/A 10/09/2018   Procedure: Endoscopic endonasal repair of ethmoidal encephalocele;  Surgeon: Jadene Pierini, MD;  [facility];  Service: Neurosurgery;  Laterality: N/A;   REPAIR OF CEREBROSPINAL FLUID LEAK N/A 10/31/2019   Procedure: Endonasal  repair of cerebrospinal fluid leak with placement of lumbar drain;  Surgeon: Jadene Pierini, MD;  [facility];  Service: Neurosurgery;  Laterality: N/A;  Endonasal repair of cerebrospinal fluid leak with placement of lumbar drain    Family History  Problem Relation Age of Onset   Healthy Mother     Social:  reports that she has never smoked. She has never used smokeless tobacco. She reports current alcohol use. She reports that she does not use drugs.  Allergies: No Known Allergies  Medications: Current Outpatient Medications  Medication Instructions   acetaminophen (TYLENOL) 650 mg, Oral, Every 6 hours PRN   docusate sodium (COLACE) 100 mg, Oral, Daily PRN   ibuprofen (ADVIL) 400 mg, Oral, Every 6 hours PRN   IRON PO 1 tablet, Oral, Daily    ROS - all of the below systems have been reviewed with the patient and positives are indicated with bold text General: chills, fever or night sweats Eyes: blurry vision or double vision ENT: epistaxis or sore throat Allergy/Immunology: itchy/watery eyes or nasal congestion Hematologic/Lymphatic: bleeding problems, blood clots or swollen lymph nodes Endocrine: temperature intolerance or unexpected weight changes Breast: new or changing breast lumps or nipple discharge Resp: cough, shortness of breath, or wheezing CV: chest pain or dyspnea on exertion GI: as per HPI GU: dysuria, trouble voiding, or hematuria MSK: joint pain or joint stiffness Neuro: TIA or stroke symptoms Derm: pruritus and skin lesion changes Psych: anxiety and depression  Objective   PE Blood  pressure (!) 164/82, pulse (!) 109, temperature 98.6 F (37 C), resp. rate 18, height 5\' 1"  (1.549 m), weight (!) 142.9 kg, SpO2 99 %. Constitutional: NAD; conversant; no deformities Eyes: Moist conjunctiva; no lid lag; anicteric; PERRL Neck: Trachea midline; no thyromegaly Lungs: Normal respiratory effort; no tactile fremitus CV: RRR; no palpable thrills; no pitting  edema GI: Abd Obese; no palpable hepatosplenomegaly MSK: Normal range of motion of extremities; no clubbing/cyanosis Psychiatric: Appropriate affect; alert and oriented x3 Lymphatic: No palpable cervical or axillary lymphadenopathy  Results for orders placed or performed during the hospital encounter of 07/07/21 (from the past 24 hour(s))  Pregnancy, urine POC     Status: None   Collection Time: 07/07/21  1:29 PM  Result Value Ref Range   Preg Test, Ur NEGATIVE NEGATIVE    Imaging Orders  No imaging studies ordered today     Assessment and Plan   [name] is an 35 y.o. female here for VP shunt placement with Dr. 32.  I was asked to assist with diagnostic laparoscopy for the abdominal portion of the procedure.  The procedure itself as well as its risks, benefits and alternatives were discussed with the patient who granted consent to proceed.  We will proceed as scheduled.   Maurice Small, MD  [facility] Use [email or web address] to contact on call provider

## 2021-07-07 NOTE — Anesthesia Procedure Notes (Signed)
Arterial Line Insertion Start/End7/19/2022 2:10 PM, 07/07/2021 2:15 PM Performed by: Leonides Grills, MD, anesthesiologist  Patient location: Pre-op. Preanesthetic checklist: patient identified, IV checked, site marked, risks and benefits discussed, surgical consent, monitors and equipment checked, pre-op evaluation, timeout performed and anesthesia consent Lidocaine 1% used for infiltration Left, radial was placed Catheter size: 20 G Hand hygiene performed , maximum sterile barriers used  and Seldinger technique used Allen's test indicative of satisfactory collateral circulation Attempts: 1 Procedure performed without using ultrasound guided technique. Following insertion, dressing applied. Patient tolerated the procedure well with no immediate complications.

## 2021-07-07 NOTE — H&P (Signed)
Surgical H&P Update  HPI: 35 y.o. 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 No changes in health/symptoms, still having drainage.   PMHx:  Past Medical History:  Diagnosis Date   Anemia    low iron   Anxiety    Chronic constipation    Elevated BP without diagnosis of hypertension    GERD (gastroesophageal reflux disease)    Headache    Nasofrontal encephalocele (HCC)    hx/notes 10/09/2018   Nocturnal hypoxemia    Obesity    Palpitations    Pneumonia    Trichomonal vaginitis    Vaginal yeast infection    FamHx:  Family History  Problem Relation Age of Onset   Healthy Mother    SocHx:  reports that she has never smoked. She has never used smokeless tobacco. She reports current alcohol use. She reports that she does not use drugs.  Physical Exam: AOx3, PERRL, FS, TM  Strength 5/5 x4, SILTx4  Assesment/Plan: 35 y.o. 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 Risks, benefits, and alternatives discussed and the patient would like to continue with surgery.  -OR today -4NP post-op  Jadene Pierini, MD 07/07/21 2:11 PM

## 2021-07-07 NOTE — Transfer of Care (Signed)
Immediate Anesthesia Transfer of Care Note  Patient: Anna Morrison  Procedure(s) Performed: Right Ventriculoperitoneal shunt (Right: Head) APPLICATION OF CRANIAL NAVIGATION (Head) LAPAROSCOPY DIAGNOSTIC (Right)  Patient Location: PACU  Anesthesia Type:General  Level of Consciousness: awake, alert  and oriented  Airway & Oxygen Therapy: Patient Spontanous Breathing  Post-op Assessment: Report given to RN and Post -op Vital signs reviewed and stable  Post vital signs: Reviewed and stable  Last Vitals:  Vitals Value Taken Time  BP 151/87   Temp    Pulse 88   Resp 12   SpO2 100     Last Pain:  Vitals:   07/07/21 1304  PainSc: 0-No pain      Patients Stated Pain Goal: 3 (07/07/21 1304)  Complications: No notable events documented.

## 2021-07-07 NOTE — Op Note (Signed)
   Patient: Anna Morrison (1986/05/15, [id number])  Date of Surgery: 07/07/2021   Preoperative Diagnosis: Nasofrontal encephalocele   Postoperative Diagnosis: * No post-op diagnosis entered *   Surgical Procedure: Panel 1 Right Ventriculoperitoneal shunt:  APPLICATION OF CRANIAL NAVIGATION:  Panel 2 LAPAROSCOPY DIAGNOSTIC: 49320 (CPT)   Operative Team Members:  Surgeon(s) and Role: Panel 1:    * Jadene Pierini, MD - Primary Panel 2:    * Jguadalupe Opiela, Hyman Hopes, MD - Primary   Anesthesiologist: Leonides Grills, MD CRNA: Lelon Perla, CRNA   Anesthesia: General   Fluids:  Total I/O In: 100 [IV Piggyback:100] Out: 100 [Blood:100]  Complications: none  Drains:  none   Specimen: none  Disposition:  PACU - hemodynamically stable.  Plan of Care:  per neurosurgery    Indications for Procedure: [name] is a 35 y.o. female who presented for VP shunt placement.  I was asked to assist with the diagnostic laparoscopy component of the procedure.  The procedure itself as well as its risks, benefits and alternatives were discussed.  The risks discussed included but were not limited to the risk of infection, bleeding, damage to nearby structures, and shunt malfunction or infection.  After a full discussion and all questions answered the patient granted consent to proceed.  Findings: adhesion between the omentum and the left upper quadrant  Infection status: Patient: Private Patient Elective Case Case: Elective Infection Present At Time Of Surgery (PATOS): None   Description of Procedure:   On the date stated above patient taken operating room suite and placed in supine position.  Neurosurgery began the procedure I was called to the operating room when it was time for the abdominal portion of the procedure.  A timeout was completed verifying the correct patient, procedure, positioning, and equipment needed for the case.  I made a incision in the right  upper quadrant and inserted a 5 mm optical trocar and establish pneumoperitoneum.  An additional 5 mm trocar was placed in the right abdomen under direct vision without any trauma the underlying viscera.  There was a small injury to the liver created with initial trocar placement which was treated with electrocautery and had good hemostasis.  The VP shunt was brought down subcutaneously by the neurosurgeon, dropped down through the right subcostal 5 mm trocar site through the fascia and placed in the peritoneum.  The abdomen was desufflated the skin was closed with Monocryl being careful not to injure the catheter.  Dermabond was applied.  All sponge needle counts are correct in this case.   Ivar Drape, MD General, Bariatric, & Minimally Invasive Surgery [facility], [place]

## 2021-07-07 NOTE — Op Note (Signed)
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 I therefore recommended shunt placement. This was discussed with the patient as well as risks, benefits, and alternatives and wished to proceed with surgery. We discussed that, should her leak continue, further surgery may be required including either endonasal or transcranial craniotomy and leak repair, possible shunt revision.   OPERATIVE DETAIL: The patient was taken to the operating room and placed on the OR table in the supine position. A formal time out was performed with two patient identifiers and confirmed the operative site. Anesthesia was induced by the anesthesia team. The head was turned contralaterally and a bump was placed under the ipsilateral shoulder to assist with tunneling. The operative sites were marked, hair was clipped with surgical clippers, and the area was then prepped and draped in a sterile fashion along the entire length of the shunt system.   The [place] head holder was applied to allow for used of stereotaxy. The patient's anatomy was co-registered with her preop CT with good fit.  A curvilinear incision was placed on the right parietal scalp using frameless stereotaxy for guidance. A burr hole was placed, dura coagulated, and a ventricular catheter was inserted using stereotaxy with good flow of CSF under notably high pressure. This was connected to a Codman Certas shunt valve set to performance level 3.0. Using a tunneler, the distal catheter was then passed subcutaneously from the scalp  incision to the abdomen. It was then connected to the shunt valve with good distal flow. It was then inserted into the abdomen by Dr. Dossie Der with visualization.  With the shunt system in place and functioning, all incisions were copiously irrigated and then closed in layers. All instrument and sponge counts were correct. The patient was then returned to anesthesia for emergence. No apparent complications at the completion of the procedure.  IMPLANTS: Codman Certas shunt valve, set to performance level 3.0   EBL:  62mL   DRAINS: none   SPECIMENS: none   Jadene Pierini, MD 07/07/21 2:13 PM

## 2021-07-07 NOTE — Anesthesia Procedure Notes (Signed)
Procedure Name: Intubation Date/Time: 07/07/2021 3:21 PM Performed by: Dorthea Cove, CRNA Pre-anesthesia Checklist: Patient identified, Emergency Drugs available, Suction available and Patient being monitored Patient Re-evaluated:Patient Re-evaluated prior to induction Oxygen Delivery Method: Circle system utilized Preoxygenation: Pre-oxygenation with 100% oxygen Induction Type: IV induction Ventilation: Mask ventilation without difficulty, Oral airway inserted - appropriate to patient size and Two handed mask ventilation required Laryngoscope Size: Mac, 4 and Glidescope Grade View: Grade I Tube type: Oral Tube size: 7.5 mm Number of attempts: 1 Airway Equipment and Method: Stylet and Oral airway Placement Confirmation: ETT inserted through vocal cords under direct vision, positive ETCO2 and breath sounds checked- equal and bilateral Secured at: 22 cm Tube secured with: Tape Dental Injury: Teeth and Oropharynx as per pre-operative assessment

## 2021-07-08 ENCOUNTER — Inpatient Hospital Stay (HOSPITAL_COMMUNITY): Payer: BC Managed Care – PPO

## 2021-07-08 ENCOUNTER — Other Ambulatory Visit: Payer: Self-pay

## 2021-07-08 ENCOUNTER — Encounter (HOSPITAL_COMMUNITY): Payer: Self-pay | Admitting: Neurological Surgery

## 2021-07-08 LAB — CBC
HCT: 41.6 % (ref 36.0–46.0)
Hemoglobin: 12.4 g/dL (ref 12.0–15.0)
MCH: 24.9 pg — ABNORMAL LOW (ref 26.0–34.0)
MCHC: 29.8 g/dL — ABNORMAL LOW (ref 30.0–36.0)
MCV: 83.5 fL (ref 80.0–100.0)
Platelets: 334 10*3/uL (ref 150–400)
RBC: 4.98 MIL/uL (ref 3.87–5.11)
RDW: 14 % (ref 11.5–15.5)
WBC: 14.2 10*3/uL — ABNORMAL HIGH (ref 4.0–10.5)
nRBC: 0 % (ref 0.0–0.2)

## 2021-07-08 LAB — CREATININE, SERUM
Creatinine, Ser: 0.79 mg/dL (ref 0.44–1.00)
GFR, Estimated: >60 mL/min (ref >60)

## 2021-07-08 MED ORDER — ACETAMINOPHEN 650 MG RE SUPP: 650.0000 mg | RECTAL | Status: DC | PRN

## 2021-07-08 MED ORDER — FENTANYL CITRATE (PF) 100 MCG/2ML IJ SOLN
INTRAMUSCULAR | Status: AC
  Filled 2021-07-08: qty 2

## 2021-07-08 MED ORDER — HYDROCODONE-ACETAMINOPHEN 5-325 MG PO TABS: 1.0000 | ORAL_TABLET | ORAL | 0 refills | Status: DC | PRN

## 2021-07-08 MED ORDER — ONDANSETRON HCL 4 MG PO TABS: 4.0000 mg | ORAL_TABLET | ORAL | Status: DC | PRN

## 2021-07-08 MED ORDER — DOCUSATE SODIUM 100 MG PO CAPS
100.0000 mg | ORAL_CAPSULE | Freq: Two times a day (BID) | ORAL | Status: DC
  Administered 2021-07-08: 100 mg via ORAL
  Filled 2021-07-08: qty 1

## 2021-07-08 MED ORDER — ACETAMINOPHEN 325 MG PO TABS: 650.0000 mg | ORAL_TABLET | ORAL | Status: DC | PRN

## 2021-07-08 MED ORDER — CEFAZOLIN SODIUM-DEXTROSE 2-4 GM/100ML-% IV SOLN
2.0000 g | Freq: Three times a day (TID) | INTRAVENOUS | Status: DC
  Administered 2021-07-08: 2 g via INTRAVENOUS
  Filled 2021-07-08 (×2): qty 100

## 2021-07-08 MED ORDER — HEPARIN SODIUM (PORCINE) 5000 UNIT/ML IJ SOLN: 5000.0000 [IU] | Freq: Three times a day (TID) | INTRAMUSCULAR | Status: DC

## 2021-07-08 MED ORDER — PROMETHAZINE HCL 25 MG PO TABS: 12.5000 mg | ORAL_TABLET | ORAL | Status: DC | PRN

## 2021-07-08 MED ORDER — POLYETHYLENE GLYCOL 3350 17 G PO PACK: 17.0000 g | PACK | Freq: Every day | ORAL | Status: DC | PRN

## 2021-07-08 MED ORDER — ONDANSETRON HCL 4 MG/2ML IJ SOLN: 4.0000 mg | INTRAMUSCULAR | Status: DC | PRN

## 2021-07-08 NOTE — Anesthesia Postprocedure Evaluation (Signed)
Anesthesia Post Note  Patient: Anna Morrison  Procedure(s) Performed: Right Ventriculoperitoneal shunt (Right: Head) APPLICATION OF CRANIAL NAVIGATION (Head) LAPAROSCOPY DIAGNOSTIC (Right)     Patient location during evaluation: PACU Anesthesia Type: General Level of consciousness: awake Pain management: pain level controlled Vital Signs Assessment: post-procedure vital signs reviewed and stable Respiratory status: spontaneous breathing, nonlabored ventilation, respiratory function stable and patient connected to nasal cannula oxygen Cardiovascular status: blood pressure returned to baseline and stable Postop Assessment: no apparent nausea or vomiting Anesthetic complications: no   No notable events documented.  Last Vitals:  Vitals:   07/08/21 0050 07/08/21 0355  BP: 125/68 132/82  Pulse:  93  Resp:  (!) 28  Temp: 37 C 36.7 C  SpO2: 96% 94%    Last Pain:  Vitals:   07/08/21 0355  TempSrc: Oral  PainSc:                  Catheryn Bacon Tyrika Newman

## 2021-07-08 NOTE — Progress Notes (Signed)
Neurosurgery Service Progress Note  Subjective: No acute events overnight, no headaches, mild abdominal discomfort, tolerate regular diet w/o issue   Objective: Vitals:   07/08/21 0035 07/08/21 0050 07/08/21 0355 07/08/21 0743  BP: 120/80 125/68 132/82 121/88  Pulse: 95  93 96  Resp: (!) 26  (!) 28 17  Temp:  98.6 F (37 C) 98.1 F (36.7 C) 98.2 F (36.8 C)  TempSrc:   Oral Oral  SpO2: 94% 96% 94% 95%  Weight:      Height:        Physical Exam: AOx3, PERRL, EOMI, FS, TM, Strength 5/5 x4, SILTx4  Assessment & Plan: 35 y.o. woman s/p R VPS placement, recovering well. Post-op AXR & CXR w/ good position, skull XR w/ Certas confirmed set at 3.0.  -CTH pending then can be discharge home  Anna Morrison  07/08/21 9:00 AM

## 2021-07-08 NOTE — Plan of Care (Signed)
  Problem: Health Behavior/Discharge Planning: Goal: Ability to manage health-related needs will improve Outcome: Progressing   Problem: Education: Goal: Knowledge of General Education information will improve Description: Including pain rating scale, medication(s)/side effects and non-pharmacologic comfort measures Outcome: Progressing   Problem: Health Behavior/Discharge Planning: Goal: Ability to manage health-related needs will improve Outcome: Progressing   Problem: Clinical Measurements: Goal: Ability to maintain clinical measurements within normal limits will improve Outcome: Progressing Goal: Will remain free from infection Outcome: Progressing Goal: Diagnostic test results will improve Outcome: Progressing Goal: Respiratory complications will improve Outcome: Progressing Goal: Cardiovascular complication will be avoided Outcome: Progressing   Problem: Activity: Goal: Risk for activity intolerance will decrease Outcome: Progressing   Problem: Nutrition: Goal: Adequate nutrition will be maintained Outcome: Progressing   Problem: Coping: Goal: Level of anxiety will decrease Outcome: Progressing   Problem: Elimination: Goal: Will not experience complications related to bowel motility Outcome: Progressing Goal: Will not experience complications related to urinary retention Outcome: Progressing   Problem: Pain Managment: Goal: General experience of comfort will improve Outcome: Progressing   Problem: Safety: Goal: Ability to remain free from injury will improve Outcome: Progressing   Problem: Skin Integrity: Goal: Risk for impaired skin integrity will decrease Outcome: Progressing   

## 2021-07-08 NOTE — Discharge Summary (Signed)
Discharge Summary  Date of Admission: 07/07/2021  Date of Discharge: 07/08/21  Attending Physician: Autumn Patty, MD  Hospital Course: Patient was admitted following an uncomplicated right VP shunt with laparoscopic assistance. She was recovered in PACU and transferred to 4NP. Her post-op xrays and CTH showed catheters in good position with a Certas set to performance level 3.0. Her hospital course was uncomplicated and the patient was discharged home on 07/08/21. She will follow up in clinic with me in 2 weeks.  Neurologic exam at discharge:  AOx3, PERRL, EOMI, FS, TM Strength 5/5 x4, SILTx4  Discharge diagnosis: CSF leak  Jadene Pierini, MD 07/08/21 9:57 AM

## 2021-07-27 DIAGNOSIS — K219 Gastro-esophageal reflux disease without esophagitis: Secondary | ICD-10-CM | POA: Diagnosis not present

## 2021-08-05 DIAGNOSIS — Z1152 Encounter for screening for COVID-19: Secondary | ICD-10-CM | POA: Diagnosis not present

## 2021-08-06 DIAGNOSIS — Z713 Dietary counseling and surveillance: Secondary | ICD-10-CM | POA: Diagnosis not present

## 2021-08-12 DIAGNOSIS — Z1152 Encounter for screening for COVID-19: Secondary | ICD-10-CM | POA: Diagnosis not present

## 2021-08-17 DIAGNOSIS — Z01818 Encounter for other preprocedural examination: Secondary | ICD-10-CM | POA: Diagnosis not present

## 2021-08-17 DIAGNOSIS — K449 Diaphragmatic hernia without obstruction or gangrene: Secondary | ICD-10-CM | POA: Diagnosis not present

## 2021-08-17 DIAGNOSIS — K224 Dyskinesia of esophagus: Secondary | ICD-10-CM | POA: Diagnosis not present

## 2021-08-19 DIAGNOSIS — R635 Abnormal weight gain: Secondary | ICD-10-CM | POA: Diagnosis not present

## 2021-08-19 DIAGNOSIS — Z1152 Encounter for screening for COVID-19: Secondary | ICD-10-CM | POA: Diagnosis not present

## 2021-08-26 DIAGNOSIS — Z1152 Encounter for screening for COVID-19: Secondary | ICD-10-CM | POA: Diagnosis not present

## 2021-08-28 DIAGNOSIS — Z713 Dietary counseling and surveillance: Secondary | ICD-10-CM | POA: Diagnosis not present

## 2021-08-29 ENCOUNTER — Ambulatory Visit (HOSPITAL_COMMUNITY): Payer: Self-pay

## 2021-09-02 DIAGNOSIS — Z1152 Encounter for screening for COVID-19: Secondary | ICD-10-CM | POA: Diagnosis not present

## 2021-09-09 DIAGNOSIS — Z1152 Encounter for screening for COVID-19: Secondary | ICD-10-CM | POA: Diagnosis not present

## 2021-09-10 DIAGNOSIS — E669 Obesity, unspecified: Secondary | ICD-10-CM | POA: Diagnosis not present

## 2021-09-10 DIAGNOSIS — F419 Anxiety disorder, unspecified: Secondary | ICD-10-CM | POA: Diagnosis not present

## 2021-09-17 DIAGNOSIS — Z1152 Encounter for screening for COVID-19: Secondary | ICD-10-CM | POA: Diagnosis not present

## 2021-09-24 DIAGNOSIS — Z1152 Encounter for screening for COVID-19: Secondary | ICD-10-CM | POA: Diagnosis not present

## 2021-10-01 DIAGNOSIS — F419 Anxiety disorder, unspecified: Secondary | ICD-10-CM | POA: Diagnosis not present

## 2021-10-02 DIAGNOSIS — Z1152 Encounter for screening for COVID-19: Secondary | ICD-10-CM | POA: Diagnosis not present

## 2021-10-09 DIAGNOSIS — S83412A Sprain of medial collateral ligament of left knee, initial encounter: Secondary | ICD-10-CM | POA: Diagnosis not present

## 2021-10-10 ENCOUNTER — Ambulatory Visit (HOSPITAL_COMMUNITY): Payer: Self-pay

## 2021-10-16 DIAGNOSIS — F419 Anxiety disorder, unspecified: Secondary | ICD-10-CM | POA: Diagnosis not present

## 2021-11-19 DIAGNOSIS — Z1152 Encounter for screening for COVID-19: Secondary | ICD-10-CM | POA: Diagnosis not present

## 2021-11-30 NOTE — Progress Notes (Deleted)
Cardiology Office Note:    Date:  11/30/2021   ID:  Anna Morrison, DOB Sep 25, 1986, MRN [id number]  PCP:  Farris Has, MD   [facility] Providers Cardiologist:  Christell Constant, MD     Referring MD: Farris Has, MD   CC: follow up post VP shunt  History of Present Illness:    Anna Morrison is a 35 y.o. female with a hx of Palpitations, OSA, Morbid Obesity, HLD, Prior endonasal/endoscopic anterior skull base leak repairs and lumbar drains last seen 01/28/20. In interim of this visit, patient had normal echo and ZioPatch.  Seen 06/05/21.  In interim of this visit, patient had VP shunt. Seen 12/01/21.  Patient notes that she is doing ***.   Since day prior/last visit notes *** . There are no*** interval hospital/ED visit.    No chest pain or pressure ***.  No SOB/DOE*** and no PND/Orthopnea***.  No weight gain or leg swelling***.  No palpitations or syncope ***.  Ambulatory blood pressure ***.   Past Medical History:  Diagnosis Date   Anemia    low iron   Anxiety    Chronic constipation    Elevated BP without diagnosis of hypertension    GERD (gastroesophageal reflux disease)    Headache    Nasofrontal encephalocele (HCC)    hx/notes 10/09/2018   Nocturnal hypoxemia    Obesity    Palpitations    Pneumonia    Trichomonal vaginitis    Vaginal yeast infection     Past Surgical History:  Procedure Laterality Date   APPLICATION OF CRANIAL NAVIGATION N/A 07/07/2021   Procedure: APPLICATION OF CRANIAL NAVIGATION;  Surgeon: Jadene Pierini, MD;  Location: [facility] OR;  Service: Neurosurgery;  Laterality: N/A;   COLONOSCOPY WITH PROPOFOL N/A 05/30/2018   Procedure: COLONOSCOPY WITH PROPOFOL;  Surgeon: Lynann Bologna, MD;  Location: [facility];  Service: Endoscopy;  Laterality: N/A;   Endoscopic endonasal repair of ethmoidal encephalocele  10/09/2018   ETHMOIDECTOMY N/A 10/09/2018   Procedure: ETHMOIDECTOMY;  Surgeon: [name], MD;  Location: [facility] OR;   Service: ENT;  Laterality: N/A;   LAPAROSCOPY Right 07/07/2021   Procedure: LAPAROSCOPY DIAGNOSTIC;  Surgeon: Quentin Ore, MD;  Location: [facility] OR;  Service: General;  Laterality: Right;   NASAL SINUS SURGERY N/A 10/09/2018   Procedure: NASAL ENDOSCOPY ,REPAIR OF FRONTAL ENCEPHALOCELE,HARVEST OF SEPTO BONE/CARTLEDGE;  Surgeon: [name], MD;  Location: [facility] OR;  Service: ENT;  Laterality: N/A;   NASAL SINUS SURGERY N/A 10/31/2019   Procedure: ENDOSCOPIC REPAIR OF CEREBROSPINAL FLUID LEAK;  Surgeon: [name], MD;  Location: [facility] OR;  Service: ENT;  Laterality: N/A;  ENDOSCOPIC REPAIR OF CEREBROSPINAL FLUID LEAK   PLACEMENT OF LUMBAR DRAIN N/A 10/31/2019   Procedure: PLACEMENT OF LUMBAR DRAIN;  Surgeon: Jadene Pierini, MD;  Location: [facility] OR;  Service: Neurosurgery;  Laterality: N/A;   REPAIR OF CEREBROSPINAL FLUID LEAK N/A 10/09/2018   Procedure: Endoscopic endonasal repair of ethmoidal encephalocele;  Surgeon: Jadene Pierini, MD;  Location: [facility] OR;  Service: Neurosurgery;  Laterality: N/A;   REPAIR OF CEREBROSPINAL FLUID LEAK N/A 10/31/2019   Procedure: Endonasal repair of cerebrospinal fluid leak with placement of lumbar drain;  Surgeon: Jadene Pierini, MD;  Location: [facility] OR;  Service: Neurosurgery;  Laterality: N/A;  Endonasal repair of cerebrospinal fluid leak with placement of lumbar drain   VENTRICULOPERITONEAL SHUNT Right 07/07/2021   Procedure: Right Ventriculoperitoneal shunt;  Surgeon: Jadene Pierini, MD;  Location: [facility] OR;  Service: Neurosurgery;  Laterality: Right;  RM 20    Current Medications: No outpatient medications have been marked as taking for the 12/01/21 encounter (Appointment) with Christell Constant, MD.     Allergies:   Patient has no known allergies.   Social History   Socioeconomic History   Marital status: Single    Spouse name: Not on file   Number of children: 0   Years of education: Not on file   Highest education level: Not on  file  Occupational History   Occupation: [employer]  Tobacco Use   Smoking status: Never   Smokeless tobacco: Never  Vaping Use   Vaping Use: Never used  Substance and Sexual Activity   Alcohol use: Yes    Comment: 10/09/2018 "couple glasses of wine/month"   Drug use: Never   Sexual activity: Yes    Birth control/protection: Condom  Other Topics Concern   Not on file  Social History Narrative   Not on file   Social Determinants of Health   Financial Resource Strain: Not on file  Food Insecurity: Not on file  Transportation Needs: Not on file  Physical Activity: Not on file  Stress: Not on file  Social Connections: Not on file    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 Possible heart problems NOS in father  ROS:   Please see the history of present illness.    Notes history of significant reflux in the past, this has improved.  All other systems reviewed and are negative.  EKGs/Labs/Other Studies Reviewed:    The following studies were reviewed today:  EKG:   06/05/21 SR 92 WNL  Cardiac Event Monitoring: Date: 03/28/20 Results: Sinus bradycardia to sinus rhythm. No arrhythmias or pauses.   Normal 8 day Zio patch monitoring.  Transthoracic Echocardiogram: Date: 02/25/20 Results:  1. Left ventricular ejection fraction, by estimation, is 55 to 60%. The  left ventricle has normal function. The left ventricle has no regional  wall motion abnormalities. Left ventricular diastolic parameters were  normal. The average left ventricular  global longitudinal strain is -18.5 %.   2. Right ventricular systolic function is normal. The right ventricular  size is normal. There is normal pulmonary artery systolic pressure.   3. The mitral valve is normal in structure. No evidence of mitral valve  regurgitation. No evidence of mitral stenosis.   4. The aortic valve is tricuspid. Aortic  valve regurgitation is not  visualized. No aortic stenosis is present.   5. The inferior vena cava is normal in size with greater than 50%  respiratory variability, suggesting right atrial pressure of 3 mmHg.    Recent Labs: 07/08/2021: Creatinine, Ser 0.79; Hemoglobin 12.4; Platelets 334  Recent Lipid Panel    Component Value Date/Time   CHOL 227 (H) 01/28/2020 1005   TRIG 132 01/28/2020 1005   HDL 53 01/28/2020 1005   CHOLHDL 4.3 01/28/2020 1005   LDLCALC 150 (H) 01/28/2020 1005        Physical Exam:    VS:  There were no vitals taken for this visit.    Wt Readings from Last 3 Encounters:  07/07/21 (!) 315 lb (142.9 kg)  07/03/21 (!) 319 lb 4.8 oz (144.8 kg)  06/05/21 (!) 319 lb (144.7 kg)     Gen: *** distress, *** obese/well nourished/malnourished   Neck: No JVD, *** carotid bruit Ears: Homero Fellers Sign Cardiac: No Rubs or Gallops, *** Murmur, ***  cardia, *** radial pulses Respiratory: Clear to auscultation bilaterally, *** effort, ***  respiratory rate GI: Soft, nontender, non-distended *** MS: No *** edema; *** moves all extremities Integument: Skin feels *** Neuro:  At time of evaluation, alert and oriented to person/place/time/situation *** Psych: Normal affect, patient feels ***   ASSESSMENT:    No diagnosis found.  PLAN:    HLD- will discuss options for therapy at next visit *** OSA -  PRN f/u  Medication Adjustments/Labs and Tests Ordered: Current medicines are reviewed at length with the patient today.  Concerns regarding medicines are outlined above.  No orders of the defined types were placed in this encounter.   No orders of the defined types were placed in this encounter.      Signed, Christell Constant, MD  11/30/2021 10:57 AM    [facility]

## 2021-12-01 ENCOUNTER — Ambulatory Visit: Payer: BC Managed Care – PPO | Admitting: Internal Medicine

## 2021-12-25 DIAGNOSIS — Z1152 Encounter for screening for COVID-19: Secondary | ICD-10-CM | POA: Diagnosis not present

## 2021-12-28 DIAGNOSIS — Z6841 Body Mass Index (BMI) 40.0 and over, adult: Secondary | ICD-10-CM | POA: Diagnosis not present

## 2021-12-28 DIAGNOSIS — Z01419 Encounter for gynecological examination (general) (routine) without abnormal findings: Secondary | ICD-10-CM | POA: Diagnosis not present

## 2021-12-28 DIAGNOSIS — N926 Irregular menstruation, unspecified: Secondary | ICD-10-CM | POA: Diagnosis not present

## 2021-12-28 DIAGNOSIS — Z3202 Encounter for pregnancy test, result negative: Secondary | ICD-10-CM | POA: Diagnosis not present

## 2022-01-08 ENCOUNTER — Other Ambulatory Visit: Payer: Self-pay

## 2022-01-08 ENCOUNTER — Emergency Department (HOSPITAL_COMMUNITY): Payer: BC Managed Care – PPO

## 2022-01-08 ENCOUNTER — Encounter (HOSPITAL_COMMUNITY): Payer: Self-pay

## 2022-01-08 ENCOUNTER — Emergency Department (HOSPITAL_COMMUNITY)
Admission: EM | Admit: 2022-01-08 | Discharge: 2022-01-08 | Disposition: A | Discharge status: Home / Self Care | Payer: BC Managed Care – PPO | Attending: Emergency Medicine | Admitting: Emergency Medicine

## 2022-01-08 DIAGNOSIS — R079 Chest pain, unspecified: Secondary | ICD-10-CM | POA: Diagnosis not present

## 2022-01-08 DIAGNOSIS — R0789 Other chest pain: Secondary | ICD-10-CM | POA: Insufficient documentation

## 2022-01-08 LAB — CBC
HCT: 42.2 % (ref 36.0–46.0)
Hemoglobin: 12.8 g/dL (ref 12.0–15.0)
MCH: 25.7 pg — ABNORMAL LOW (ref 26.0–34.0)
MCHC: 30.3 g/dL (ref 30.0–36.0)
MCV: 84.6 fL (ref 80.0–100.0)
Platelets: 327 10*3/uL (ref 150–400)
RBC: 4.99 MIL/uL (ref 3.87–5.11)
RDW: 13.6 % (ref 11.5–15.5)
WBC: 7.7 10*3/uL (ref 4.0–10.5)
nRBC: 0 % (ref 0.0–0.2)

## 2022-01-08 LAB — BASIC METABOLIC PANEL
Anion gap: 7 (ref 5–15)
BUN: 11 mg/dL (ref 6–20)
CO2: 27 mmol/L (ref 22–32)
Calcium: 9.1 mg/dL (ref 8.9–10.3)
Chloride: 105 mmol/L (ref 98–111)
Creatinine, Ser: 0.76 mg/dL (ref 0.44–1.00)
GFR, Estimated: >60 mL/min (ref >60)
Glucose, Bld: 110 mg/dL — ABNORMAL HIGH (ref 70–99)
Potassium: 3.8 mmol/L (ref 3.5–5.1)
Sodium: 139 mmol/L (ref 135–145)

## 2022-01-08 LAB — TROPONIN I (HIGH SENSITIVITY)
Troponin I (High Sensitivity): 3 ng/L (ref <18)
Troponin I (High Sensitivity): <2 ng/L (ref <18)

## 2022-01-08 LAB — I-STAT BETA HCG BLOOD, ED (MC, WL, AP ONLY): I-stat hCG, quantitative: <5 m[IU]/mL (ref <5)

## 2022-01-08 NOTE — Discharge Instructions (Addendum)
It was a pleasure taking care of you today!   Your workup was negative in the ED. You may apply ice or heat to the affected area for 15 minutes at a time.  Ensure to place a barrier between your skin and and the ice.  You may use over-the-counter 1,000 mg Tylenol every 6 hours or 600 mg ibuprofen every 6 hours as needed for pain.  Follow-up with your primary care provider for evaluation of your symptoms. You may return to the ED if you are experiencing increasing/worsening chest pain, shortness of breath, or worsening symptoms.

## 2022-01-08 NOTE — ED Triage Notes (Signed)
Pt arrived POV from home c/o CP that started a couple of hours ago. Pt states the pain is left sided and radiates into her back.

## 2022-01-08 NOTE — ED Provider Triage Note (Signed)
Emergency Medicine Provider Triage Evaluation Note  Anna Morrison , a 36 y.o. female  was evaluated in triage.  Pt complains of chest pain   Review of Systems  Positive: Chest pain  Negative: fever  Physical Exam  BP 124/68 (BP Location: Right Arm)    Pulse 92    Temp 98.6 F (37 C) (Oral)    Resp 20    Ht 5\' 1"  (1.549 m)    Wt 135.6 kg    SpO2 100%    BMI 56.50 kg/m  Gen:   Awake, no distress   Resp:  Normal effort  MSK:   Moves extremities without difficulty  Other:    Medical Decision Making  Medically screening exam initiated at 3:24 PM.  Appropriate orders placed.  Anna Morrison was informed that the remainder of the evaluation will be completed by another provider, this initial triage assessment does not replace that evaluation, and the importance of remaining in the ED until their evaluation is complete.     Rodney Booze, Elson Areas 01/08/22 1525

## 2022-01-08 NOTE — ED Notes (Signed)
Discharge instructions and follow up care reviewed and explained, pt verbalized understanding. 

## 2022-01-08 NOTE — ED Provider Notes (Signed)
[facility] EMERGENCY DEPARTMENT Provider Note   CSN: [id number] Arrival date & time: 01/08/22  1453     History  Chief Complaint  Patient presents with   Chest Pain    Anna Morrison is a 36 y.o. female with a past medical history of GERD, palpitations who presents to the ED complaining of pressure, nonradiating, left-sided chest pain onset this morning.  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  Her chest pressure sensation lasted for several minutes however resolved on its own.  Radiates into her back onset earlier this morning.  Denies sick contacts.  Has not tried any medications for her symptoms.  Patient notes that she has been working out more often to prepare for gastric sleeve surgery in February.  She notes that yesterday she worked out her chest, back, arms prior to the onset of her symptoms.  Denies shortness of breath, palpitations, abdominal pain, nausea, vomiting, fever, chills.  Denies past medical history of MI, CAD, hypertension, diabetes, cardiac catheterization, stents.  The history is provided by the patient. No language interpreter was used.      Home Medications Prior to Admission medications   Medication Sig Start Date End Date Taking? Authorizing Provider  docusate sodium (COLACE) 100 MG capsule Take 100 mg by mouth daily as needed for moderate constipation.   Yes [provider]  ferrous sulfate 325 (65 FE) MG tablet Take 325 mg by mouth daily with breakfast.   Yes [provider]  ibuprofen (ADVIL) 200 MG tablet Take 400 mg by mouth every 6 (six) hours as needed for moderate pain or headache.   Yes [provider]  HYDROcodone-acetaminophen (NORCO/VICODIN) 5-325 MG tablet Take 1 tablet by mouth every 4 (four) hours as needed for moderate pain. Patient not taking: Reported on 01/08/2022 07/08/21   Jadene Pierini, MD      Allergies    Patient  has no known allergies.    Review of Systems   Review of Systems  Constitutional:  Negative for chills and fever.  Respiratory:  Negative for shortness of breath.   Cardiovascular:  Positive for chest pain. Negative for palpitations.  Gastrointestinal:  Negative for abdominal pain, nausea and vomiting.  Skin:  Negative for rash.  All other systems reviewed and are negative.  Physical Exam Updated Vital Signs BP 127/75    Pulse 89    Temp 98 F (36.7 C)    Resp 18    Ht 5\' 1"  (1.549 m)    Wt 135.6 kg    SpO2 97%    BMI 56.50 kg/m  Physical Exam Vitals and nursing note reviewed.  Constitutional:      General: She is not in acute distress.    Appearance: She is not diaphoretic.  HENT:     Head: Normocephalic and atraumatic.     Mouth/Throat:     Pharynx: No oropharyngeal exudate.  Eyes:     General: No scleral icterus.    Conjunctiva/sclera: Conjunctivae normal.  Cardiovascular:     Rate and Rhythm: Normal rate and regular rhythm.     Pulses: Normal pulses.     Heart sounds: Normal heart sounds.  Pulmonary:     Effort: Pulmonary effort is normal. No respiratory distress.     Breath sounds: Normal breath sounds. No wheezing.  Chest:     Chest wall: No tenderness.     Comments: No chest wall tenderness  to palpation. Abdominal:     General: Bowel sounds are normal.     Palpations: Abdomen is soft. There is no mass.     Tenderness: There is no abdominal tenderness. There is no guarding or rebound.  Musculoskeletal:        General: Normal range of motion.     Cervical back: Normal range of motion and neck supple.  Skin:    General: Skin is warm and dry.  Neurological:     Mental Status: She is alert.  Psychiatric:        Behavior: Behavior normal.    ED Results / Procedures / Treatments   Labs (all labs ordered are listed, but only abnormal results are displayed) Labs Reviewed  BASIC METABOLIC PANEL - Abnormal; Notable for the following components:      Result Value    Glucose, Bld 110 (*)    All other components within normal limits  CBC - Abnormal; Notable for the following components:   MCH 25.7 (*)    All other components within normal limits  I-STAT BETA HCG BLOOD, ED ([facility], [facility], [facility] ONLY)  TROPONIN I (HIGH SENSITIVITY)  TROPONIN I (HIGH SENSITIVITY)    EKG EKG Interpretation  Date/Time:  Friday January 08 2022 15:00:43 EST Ventricular Rate:  93 PR Interval:  158 QRS Duration: 84 QT Interval:  356 QTC Calculation: 442 R Axis:   1 Text Interpretation: Normal sinus rhythm Minimal voltage criteria for LVH, may be normal variant ( R in aVL ) Cannot rule out Anterior infarct , age undetermined Abnormal ECG When compared to prior, similar appearance. No STEMI Confirmed by Antony Blackbird [phone]) on 01/08/2022 11:06:47 PM  Radiology DG Chest 2 View  Result Date: 01/08/2022 CLINICAL DATA:  Chest pain. EXAM: CHEST - 2 VIEW COMPARISON:  Chest x-ray dated July 08, 2021. FINDINGS: The heart remains at the upper limits of normal in size. Normal mediastinal contours. Normal pulmonary vascularity. No focal consolidation, pleural effusion, or pneumothorax. No acute osseous abnormality. Unchanged VP shunt catheter tubing overlying the right chest. IMPRESSION: 1. No acute cardiopulmonary disease. Electronically Signed   By: Titus Dubin M.D.   On: 01/08/2022 16:01    Procedures Procedures    Medications Ordered in ED Medications - No data to display  ED Course/ Medical Decision Making/ A&P                           Medical Decision Making Amount and/or Complexity of Data Reviewed Labs: ordered. Radiology: ordered.   Patient presents to the ED with left-sided, nonradiating chest pain onset this morning.  Patient is a current Print production planner and noted her chest pain upon standing and lasted for several minutes this morning.  She is preparing for gastric sleeve surgery in February and has been working out more often.  She worked out yesterday on her  chest, back, arms prior to the onset of her symptoms.  No prior history of MI, catheterization.  Vital signs, patient not hypoxic, afebrile.  On exam patient without acute cardiovascular, respiratory, abdominal exam findings.  Differential diagnosis includes ACS, aortic dissection, pneumothorax, PE, PNA.    EKG: No acute ST/T changes.  Labs:  I ordered, and personally interpreted labs.  The pertinent results include:   CBC unremarkable, no leukocytosis.   BMP unremarkable. I-STAT beta-hCG negative. Initial troponin 3, repeat troponin less than 2.   Imaging: I ordered imaging studies including chest x-ray I independently visualized and  interpreted imaging which showed no acute cardiopulmonary process I agree with the radiologist interpretation  Disposition: Patient presentation suspicious for being musculoskeletal in nature due to increase exercise workout preparing for gastric sleeve in February 2023.  Doubt ACS, EKG without acute ST/T changes, troponins negative, chest x-ray negative.  Doubt aortic dissection or pneumothorax at this time, chest x-ray without acute findings, vital signs stable.  Doubt pneumonia at this time due to negative chest x-ray.  After consideration of the diagnostic results and the patients response to treatment, I feel that the patent would benefit from discharge home with follow-up with her primary care provider. Supportive care measures and strict return precautions discussed with patient at bedside. Pt acknowledges and verbalizes understanding. Pt appears safe for discharge. Follow up as indicated in discharge paperwork.      This chart was dictated using voice recognition software, Dragon. Despite the best efforts of this provider to proofread and correct errors, errors may still occur which can change documentation meaning.   Final Clinical Impression(s) / ED Diagnoses Final diagnoses:  Other chest pain    Rx / DC Orders ED Discharge Orders     None          Trachelle Low A, PA-C 01/08/22 [place], [place], DO 01/16/22 [phone]

## 2022-01-20 HISTORY — PX: LAPAROSCOPIC GASTRIC SLEEVE RESECTION: SHX5895

## 2022-01-25 DIAGNOSIS — Z713 Dietary counseling and surveillance: Secondary | ICD-10-CM | POA: Diagnosis not present

## 2022-01-26 DIAGNOSIS — Z01818 Encounter for other preprocedural examination: Secondary | ICD-10-CM | POA: Diagnosis not present

## 2022-01-26 DIAGNOSIS — R635 Abnormal weight gain: Secondary | ICD-10-CM | POA: Diagnosis not present

## 2022-01-26 DIAGNOSIS — Z6841 Body Mass Index (BMI) 40.0 and over, adult: Secondary | ICD-10-CM | POA: Diagnosis not present

## 2022-02-16 DIAGNOSIS — G9601 Cranial cerebrospinal fluid leak, spontaneous: Secondary | ICD-10-CM | POA: Diagnosis not present

## 2022-02-16 DIAGNOSIS — Z6841 Body Mass Index (BMI) 40.0 and over, adult: Secondary | ICD-10-CM | POA: Diagnosis not present

## 2022-02-16 DIAGNOSIS — K219 Gastro-esophageal reflux disease without esophagitis: Secondary | ICD-10-CM | POA: Diagnosis not present

## 2022-02-16 DIAGNOSIS — K2950 Unspecified chronic gastritis without bleeding: Secondary | ICD-10-CM | POA: Diagnosis not present

## 2022-02-17 DIAGNOSIS — K219 Gastro-esophageal reflux disease without esophagitis: Secondary | ICD-10-CM | POA: Diagnosis not present

## 2022-02-17 DIAGNOSIS — Z6841 Body Mass Index (BMI) 40.0 and over, adult: Secondary | ICD-10-CM | POA: Diagnosis not present

## 2022-03-12 DIAGNOSIS — Z903 Acquired absence of stomach [part of]: Secondary | ICD-10-CM | POA: Diagnosis not present

## 2022-03-12 DIAGNOSIS — Z713 Dietary counseling and surveillance: Secondary | ICD-10-CM | POA: Diagnosis not present

## 2022-05-07 ENCOUNTER — Other Ambulatory Visit: Payer: Self-pay

## 2022-05-07 ENCOUNTER — Emergency Department (HOSPITAL_COMMUNITY)
Admission: EM | Admit: 2022-05-07 | Discharge: 2022-05-07 | Disposition: A | Discharge status: Home / Self Care | Payer: BC Managed Care – PPO | Attending: Emergency Medicine | Admitting: Emergency Medicine

## 2022-05-07 DIAGNOSIS — R5383 Other fatigue: Secondary | ICD-10-CM | POA: Insufficient documentation

## 2022-05-07 LAB — URINALYSIS, ROUTINE W REFLEX MICROSCOPIC
Bilirubin Urine: NEGATIVE
Glucose, UA: NEGATIVE mg/dL
Hgb urine dipstick: NEGATIVE
Ketones, ur: 40 mg/dL — AB
Leukocytes,Ua: NEGATIVE
Nitrite: NEGATIVE
Protein, ur: NEGATIVE mg/dL
Specific Gravity, Urine: 1.015 (ref 1.005–1.030)
pH: 6 (ref 5.0–8.0)

## 2022-05-07 LAB — COMPREHENSIVE METABOLIC PANEL
ALT: 16 U/L (ref 0–44)
AST: 15 U/L (ref 15–41)
Albumin: 3.4 g/dL — ABNORMAL LOW (ref 3.5–5.0)
Alkaline Phosphatase: 54 U/L (ref 38–126)
Anion gap: 7 (ref 5–15)
BUN: 10 mg/dL (ref 6–20)
CO2: 26 mmol/L (ref 22–32)
Calcium: 9.3 mg/dL (ref 8.9–10.3)
Chloride: 108 mmol/L (ref 98–111)
Creatinine, Ser: 0.82 mg/dL (ref 0.44–1.00)
GFR, Estimated: >60 mL/min (ref >60)
Glucose, Bld: 89 mg/dL (ref 70–99)
Potassium: 3.8 mmol/L (ref 3.5–5.1)
Sodium: 141 mmol/L (ref 135–145)
Total Bilirubin: 0.7 mg/dL (ref 0.3–1.2)
Total Protein: 6.5 g/dL (ref 6.5–8.1)

## 2022-05-07 LAB — TSH: TSH: 1.382 u[IU]/mL (ref 0.350–4.500)

## 2022-05-07 LAB — CBC WITH DIFFERENTIAL/PLATELET
Abs Immature Granulocytes: 0.04 10*3/uL (ref 0.00–0.07)
Basophils Absolute: 0 10*3/uL (ref 0.0–0.1)
Basophils Relative: 0 %
Eosinophils Absolute: 0.1 10*3/uL (ref 0.0–0.5)
Eosinophils Relative: 1 %
HCT: 40.8 % (ref 36.0–46.0)
Hemoglobin: 12.7 g/dL (ref 12.0–15.0)
Immature Granulocytes: 1 %
Lymphocytes Relative: 23 %
Lymphs Abs: 1.9 10*3/uL (ref 0.7–4.0)
MCH: 27.4 pg (ref 26.0–34.0)
MCHC: 31.1 g/dL (ref 30.0–36.0)
MCV: 88.1 fL (ref 80.0–100.0)
Monocytes Absolute: 0.5 10*3/uL (ref 0.1–1.0)
Monocytes Relative: 6 %
Neutro Abs: 5.9 10*3/uL (ref 1.7–7.7)
Neutrophils Relative %: 69 %
Platelets: 308 10*3/uL (ref 150–400)
RBC: 4.63 MIL/uL (ref 3.87–5.11)
RDW: 13.4 % (ref 11.5–15.5)
WBC: 8.5 10*3/uL (ref 4.0–10.5)
nRBC: 0 % (ref 0.0–0.2)

## 2022-05-07 LAB — I-STAT BETA HCG BLOOD, ED (MC, WL, AP ONLY): I-stat hCG, quantitative: <5 m[IU]/mL (ref <5)

## 2022-05-07 MED ORDER — SODIUM CHLORIDE 0.9 % IV BOLUS
1000.0000 mL | Freq: Once | INTRAVENOUS | Status: DC
Start: 2022-05-07 — End: 2022-05-07

## 2022-05-07 NOTE — ED Provider Notes (Signed)
[facility] EMERGENCY DEPARTMENT Provider Note   CSN: [id number] Arrival date & time: 05/07/22  0947     History  Chief Complaint  Patient presents with   Weakness    Anna Morrison is a 36 y.o. female.  36 year old female with complaint of fatigue onset Monday (4 days ago).  States that she had a boost of energy the following day and was able to do more however fatigue resumed the next day and has persistent.  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  States that she had gastric sleeve surgery 3 months ago, has been healing well.  Is taking her multivitamin however her calcium is on backorder.  History of anemia, not on iron supplement, last hemoglobin in March was 12.9 (on chart review).  Does have a shunt but states that she is not having any problems with this today.  She denies fevers, recent travel.  No known sick contacts.  LMP earlier this week, no concerns for pregnancy.  Denies chest pain, shortness of breath, fevers, abdominal pain, dietary changes or other complaints or concerns.      Home Medications Prior to Admission medications   Medication Sig Start Date End Date Taking? Authorizing Provider  CALCIUM PO Take 1 tablet by mouth daily.   Yes [provider]  docusate sodium (COLACE) 100 MG capsule Take 100 mg by mouth daily as needed for moderate constipation.   Yes [provider]  ibuprofen (ADVIL) 200 MG tablet Take 400 mg by mouth every 6 (six) hours as needed for moderate pain or headache.   Yes [provider]  Multiple Vitamin (MULTI-VITAMIN DAILY PO) Take 1 tablet by mouth daily.   Yes [provider]  ursodiol (ACTIGALL) 250 MG tablet Take 250 mg by mouth 2 (two) times daily. 03/09/22  Yes [provider]  HYDROcodone-acetaminophen (NORCO/VICODIN) 5-325 MG tablet Take 1 tablet by mouth every 4 (four) hours as needed for moderate  pain. Patient not taking: Reported on 01/08/2022 07/08/21   Jadene Pierini, MD      Allergies    Patient has no known allergies.    Review of Systems   Review of Systems Negative except as per HPI Physical Exam Updated Vital Signs BP 120/78 (BP Location: Right Arm)   Pulse 77   Temp 98 F (36.7 C)   Resp 16   SpO2 98%  Physical Exam Vitals and nursing note reviewed.  Constitutional:      General: She is not in acute distress.    Appearance: She is well-developed. She is not diaphoretic.  HENT:     Head: Normocephalic and atraumatic.     Nose: Nose normal.  Eyes:     Conjunctiva/sclera: Conjunctivae normal.  Cardiovascular:     Rate and Rhythm: Normal rate and regular rhythm.     Pulses: Normal pulses.     Heart sounds: Normal heart sounds.  Pulmonary:     Effort: Pulmonary effort is normal.     Breath sounds: Normal breath sounds.  Abdominal:     Palpations: Abdomen is soft.     Tenderness: There is no abdominal tenderness.  Musculoskeletal:     Right lower leg: No edema.     Left lower leg: No edema.  Skin:    General: Skin is warm and dry.     Findings: No erythema or rash.  Neurological:     Mental Status:  She is alert and oriented to person, place, and time.     Motor: No weakness.  Psychiatric:        Behavior: Behavior normal.    ED Results / Procedures / Treatments   Labs (all labs ordered are listed, but only abnormal results are displayed) Labs Reviewed  COMPREHENSIVE METABOLIC PANEL - Abnormal; Notable for the following components:      Result Value   Albumin 3.4 (*)    All other components within normal limits  URINALYSIS, ROUTINE W REFLEX MICROSCOPIC - Abnormal; Notable for the following components:   Ketones, ur 40 (*)    All other components within normal limits  CBC WITH DIFFERENTIAL/PLATELET  TSH  I-STAT BETA HCG BLOOD, ED ([facility], [facility], [facility] ONLY)    EKG EKG Interpretation  Date/Time:  Friday May 07 2022 10:01:47 EDT Ventricular Rate:   71 PR Interval:  167 QRS Duration: 105 QT Interval:  398 QTC Calculation: 433 R Axis:   28 Text Interpretation: Sinus rhythm when compared to prior, similar appearance . difference in p wave morphology throughout ECG. No STEMI Confirmed by Theda Belfast ([id number]) on 05/07/2022 10:40:30 AM  Radiology No results found.  Procedures Procedures    Medications Ordered in ED Medications - No data to display   ED Course/ Medical Decision Making/ A&P                           Medical Decision Making Amount and/or Complexity of Data Reviewed Labs: ordered.   36 year old female with past medical history of gastric sleeve surgery, VP shunt presents with complaint of fatigue as above.  Patient is well-appearing on exam, vitals reassuring.  Labs were checked including CBC, CMP, urinalysis, TSH and hCG.  No significant findings.  I discussed results with patient, unfortunately, she is a difficult stick and was unable to receive IV fluids today however ultimately not felt necessary as patient is tolerating p.o.'s.  Patient is encouraged to continue oral fluids and follow-up with her provider, may benefit from vitamin testing or other work-up if her symptoms continue.  Prior medical records reviewed including office visit from March 16, 2022 to her weight management center as postop follow-up, patient was doing well in her postsurgical period.        Final Clinical Impression(s) / ED Diagnoses Final diagnoses:  Other fatigue    Rx / DC Orders ED Discharge Orders     None         Jeannie Fend, PA-C 05/07/22 1553    Tegeler, Canary Brim, MD 05/07/22 1642

## 2022-05-07 NOTE — Discharge Instructions (Signed)
Your work up today is reassuring, the source of your fatigue has not been identified.  Please follow up with your doctor for recheck and further work up.

## 2022-05-07 NOTE — ED Triage Notes (Signed)
Patient coming from home, complaint of generalized weakness for 1 week. Pt endorses gastric sleeve surgery 2 months ago. States oral intake has been normal just has not been feeling well.

## 2022-05-10 DIAGNOSIS — Z9884 Bariatric surgery status: Secondary | ICD-10-CM | POA: Diagnosis not present

## 2022-05-10 DIAGNOSIS — Z713 Dietary counseling and surveillance: Secondary | ICD-10-CM | POA: Diagnosis not present

## 2022-05-12 DIAGNOSIS — N898 Other specified noninflammatory disorders of vagina: Secondary | ICD-10-CM | POA: Diagnosis not present

## 2022-05-12 DIAGNOSIS — Z113 Encounter for screening for infections with a predominantly sexual mode of transmission: Secondary | ICD-10-CM | POA: Diagnosis not present

## 2022-05-12 DIAGNOSIS — Z6841 Body Mass Index (BMI) 40.0 and over, adult: Secondary | ICD-10-CM | POA: Diagnosis not present

## 2022-06-04 DIAGNOSIS — Z903 Acquired absence of stomach [part of]: Secondary | ICD-10-CM | POA: Diagnosis not present

## 2022-06-04 DIAGNOSIS — Z713 Dietary counseling and surveillance: Secondary | ICD-10-CM | POA: Diagnosis not present

## 2022-06-14 DIAGNOSIS — F4320 Adjustment disorder, unspecified: Secondary | ICD-10-CM | POA: Diagnosis not present

## 2022-06-15 ENCOUNTER — Other Ambulatory Visit: Payer: Self-pay

## 2022-06-15 ENCOUNTER — Emergency Department (HOSPITAL_COMMUNITY)
Admission: EM | Admit: 2022-06-15 | Discharge: 2022-06-15 | Disposition: A | Discharge status: Home / Self Care | Payer: BC Managed Care – PPO | Attending: Student | Admitting: Student

## 2022-06-15 ENCOUNTER — Encounter (HOSPITAL_COMMUNITY): Payer: Self-pay

## 2022-06-15 ENCOUNTER — Emergency Department (HOSPITAL_COMMUNITY): Payer: BC Managed Care – PPO

## 2022-06-15 DIAGNOSIS — R079 Chest pain, unspecified: Secondary | ICD-10-CM | POA: Diagnosis not present

## 2022-06-15 DIAGNOSIS — M546 Pain in thoracic spine: Secondary | ICD-10-CM | POA: Diagnosis not present

## 2022-06-15 DIAGNOSIS — M25512 Pain in left shoulder: Secondary | ICD-10-CM | POA: Diagnosis not present

## 2022-06-15 DIAGNOSIS — N9489 Other specified conditions associated with female genital organs and menstrual cycle: Secondary | ICD-10-CM | POA: Insufficient documentation

## 2022-06-15 DIAGNOSIS — M542 Cervicalgia: Secondary | ICD-10-CM | POA: Diagnosis not present

## 2022-06-15 DIAGNOSIS — R109 Unspecified abdominal pain: Secondary | ICD-10-CM | POA: Diagnosis not present

## 2022-06-15 LAB — COMPREHENSIVE METABOLIC PANEL
ALT: 21 U/L (ref 0–44)
AST: 15 U/L (ref 15–41)
Albumin: 3.6 g/dL (ref 3.5–5.0)
Alkaline Phosphatase: 54 U/L (ref 38–126)
Anion gap: 5 (ref 5–15)
BUN: 8 mg/dL (ref 6–20)
CO2: 26 mmol/L (ref 22–32)
Calcium: 9.9 mg/dL (ref 8.9–10.3)
Chloride: 109 mmol/L (ref 98–111)
Creatinine, Ser: 0.82 mg/dL (ref 0.44–1.00)
GFR, Estimated: >60 mL/min (ref >60)
Glucose, Bld: 84 mg/dL (ref 70–99)
Potassium: 4.3 mmol/L (ref 3.5–5.1)
Sodium: 140 mmol/L (ref 135–145)
Total Bilirubin: 0.5 mg/dL (ref 0.3–1.2)
Total Protein: 7 g/dL (ref 6.5–8.1)

## 2022-06-15 LAB — CBC WITH DIFFERENTIAL/PLATELET
Abs Immature Granulocytes: 0.03 10*3/uL (ref 0.00–0.07)
Basophils Absolute: 0.1 10*3/uL (ref 0.0–0.1)
Basophils Relative: 1 %
Eosinophils Absolute: 0.1 10*3/uL (ref 0.0–0.5)
Eosinophils Relative: 1 %
HCT: 42.8 % (ref 36.0–46.0)
Hemoglobin: 13.1 g/dL (ref 12.0–15.0)
Immature Granulocytes: 0 %
Lymphocytes Relative: 27 %
Lymphs Abs: 2.6 10*3/uL (ref 0.7–4.0)
MCH: 27.4 pg (ref 26.0–34.0)
MCHC: 30.6 g/dL (ref 30.0–36.0)
MCV: 89.5 fL (ref 80.0–100.0)
Monocytes Absolute: 0.5 10*3/uL (ref 0.1–1.0)
Monocytes Relative: 6 %
Neutro Abs: 6.2 10*3/uL (ref 1.7–7.7)
Neutrophils Relative %: 65 %
Platelets: 354 10*3/uL (ref 150–400)
RBC: 4.78 MIL/uL (ref 3.87–5.11)
RDW: 13 % (ref 11.5–15.5)
WBC: 9.5 10*3/uL (ref 4.0–10.5)
nRBC: 0 % (ref 0.0–0.2)

## 2022-06-15 LAB — LACTIC ACID, PLASMA: Lactic Acid, Venous: 0.8 mmol/L (ref 0.5–1.9)

## 2022-06-15 LAB — I-STAT BETA HCG BLOOD, ED (MC, WL, AP ONLY): I-stat hCG, quantitative: <5 m[IU]/mL (ref <5)

## 2022-06-15 MED ORDER — LIDOCAINE 5 % EX PTCH
1.0000 | MEDICATED_PATCH | CUTANEOUS | Status: DC
  Administered 2022-06-15: 1 via TRANSDERMAL
  Filled 2022-06-15: qty 1

## 2022-06-15 MED ORDER — NAPROXEN 250 MG PO TABS
500.0000 mg | ORAL_TABLET | Freq: Once | ORAL | Status: AC
Start: 2022-06-15 — End: 2022-06-15
  Administered 2022-06-15: 500 mg via ORAL
  Filled 2022-06-15: qty 2

## 2022-06-15 MED ORDER — NAPROXEN 375 MG PO TABS: 375.0000 mg | ORAL_TABLET | Freq: Two times a day (BID) | ORAL | 0 refills | Status: DC

## 2022-06-15 MED ORDER — KETOROLAC TROMETHAMINE 15 MG/ML IJ SOLN
15.0000 mg | Freq: Once | INTRAMUSCULAR | Status: DC
Start: 2022-06-15 — End: 2022-06-15

## 2022-06-15 MED ORDER — LIDOCAINE 5 % EX PTCH: 1.0000 | MEDICATED_PATCH | CUTANEOUS | 0 refills | Status: DC

## 2022-07-28 DIAGNOSIS — J029 Acute pharyngitis, unspecified: Secondary | ICD-10-CM | POA: Diagnosis not present

## 2022-08-03 DIAGNOSIS — A5600 Chlamydial infection of lower genitourinary tract, unspecified: Secondary | ICD-10-CM | POA: Diagnosis not present

## 2022-08-10 DIAGNOSIS — E669 Obesity, unspecified: Secondary | ICD-10-CM | POA: Diagnosis not present

## 2022-08-10 DIAGNOSIS — Z9884 Bariatric surgery status: Secondary | ICD-10-CM | POA: Diagnosis not present

## 2022-08-10 DIAGNOSIS — Z713 Dietary counseling and surveillance: Secondary | ICD-10-CM | POA: Diagnosis not present

## 2022-08-10 DIAGNOSIS — Z6841 Body Mass Index (BMI) 40.0 and over, adult: Secondary | ICD-10-CM | POA: Diagnosis not present

## 2022-08-26 DIAGNOSIS — Z113 Encounter for screening for infections with a predominantly sexual mode of transmission: Secondary | ICD-10-CM | POA: Diagnosis not present

## 2022-08-26 DIAGNOSIS — N760 Acute vaginitis: Secondary | ICD-10-CM | POA: Diagnosis not present

## 2022-10-27 DIAGNOSIS — J40 Bronchitis, not specified as acute or chronic: Secondary | ICD-10-CM | POA: Diagnosis not present

## 2022-11-05 DIAGNOSIS — Z713 Dietary counseling and surveillance: Secondary | ICD-10-CM | POA: Diagnosis not present

## 2022-11-05 DIAGNOSIS — Z903 Acquired absence of stomach [part of]: Secondary | ICD-10-CM | POA: Diagnosis not present

## 2022-11-18 DIAGNOSIS — Z3202 Encounter for pregnancy test, result negative: Secondary | ICD-10-CM | POA: Diagnosis not present

## 2022-11-18 DIAGNOSIS — Z6841 Body Mass Index (BMI) 40.0 and over, adult: Secondary | ICD-10-CM | POA: Diagnosis not present

## 2022-11-18 DIAGNOSIS — N926 Irregular menstruation, unspecified: Secondary | ICD-10-CM | POA: Diagnosis not present

## 2023-05-21 IMAGING — CR DG CHEST 2V
2 series · 2 of 2 positions shown · non-contrast
Comparison: Chest x-ray dated July 08, 2021.

CLINICAL DATA: Chest pain.

EXAM:
CHEST - 2 VIEW

[chest pa]
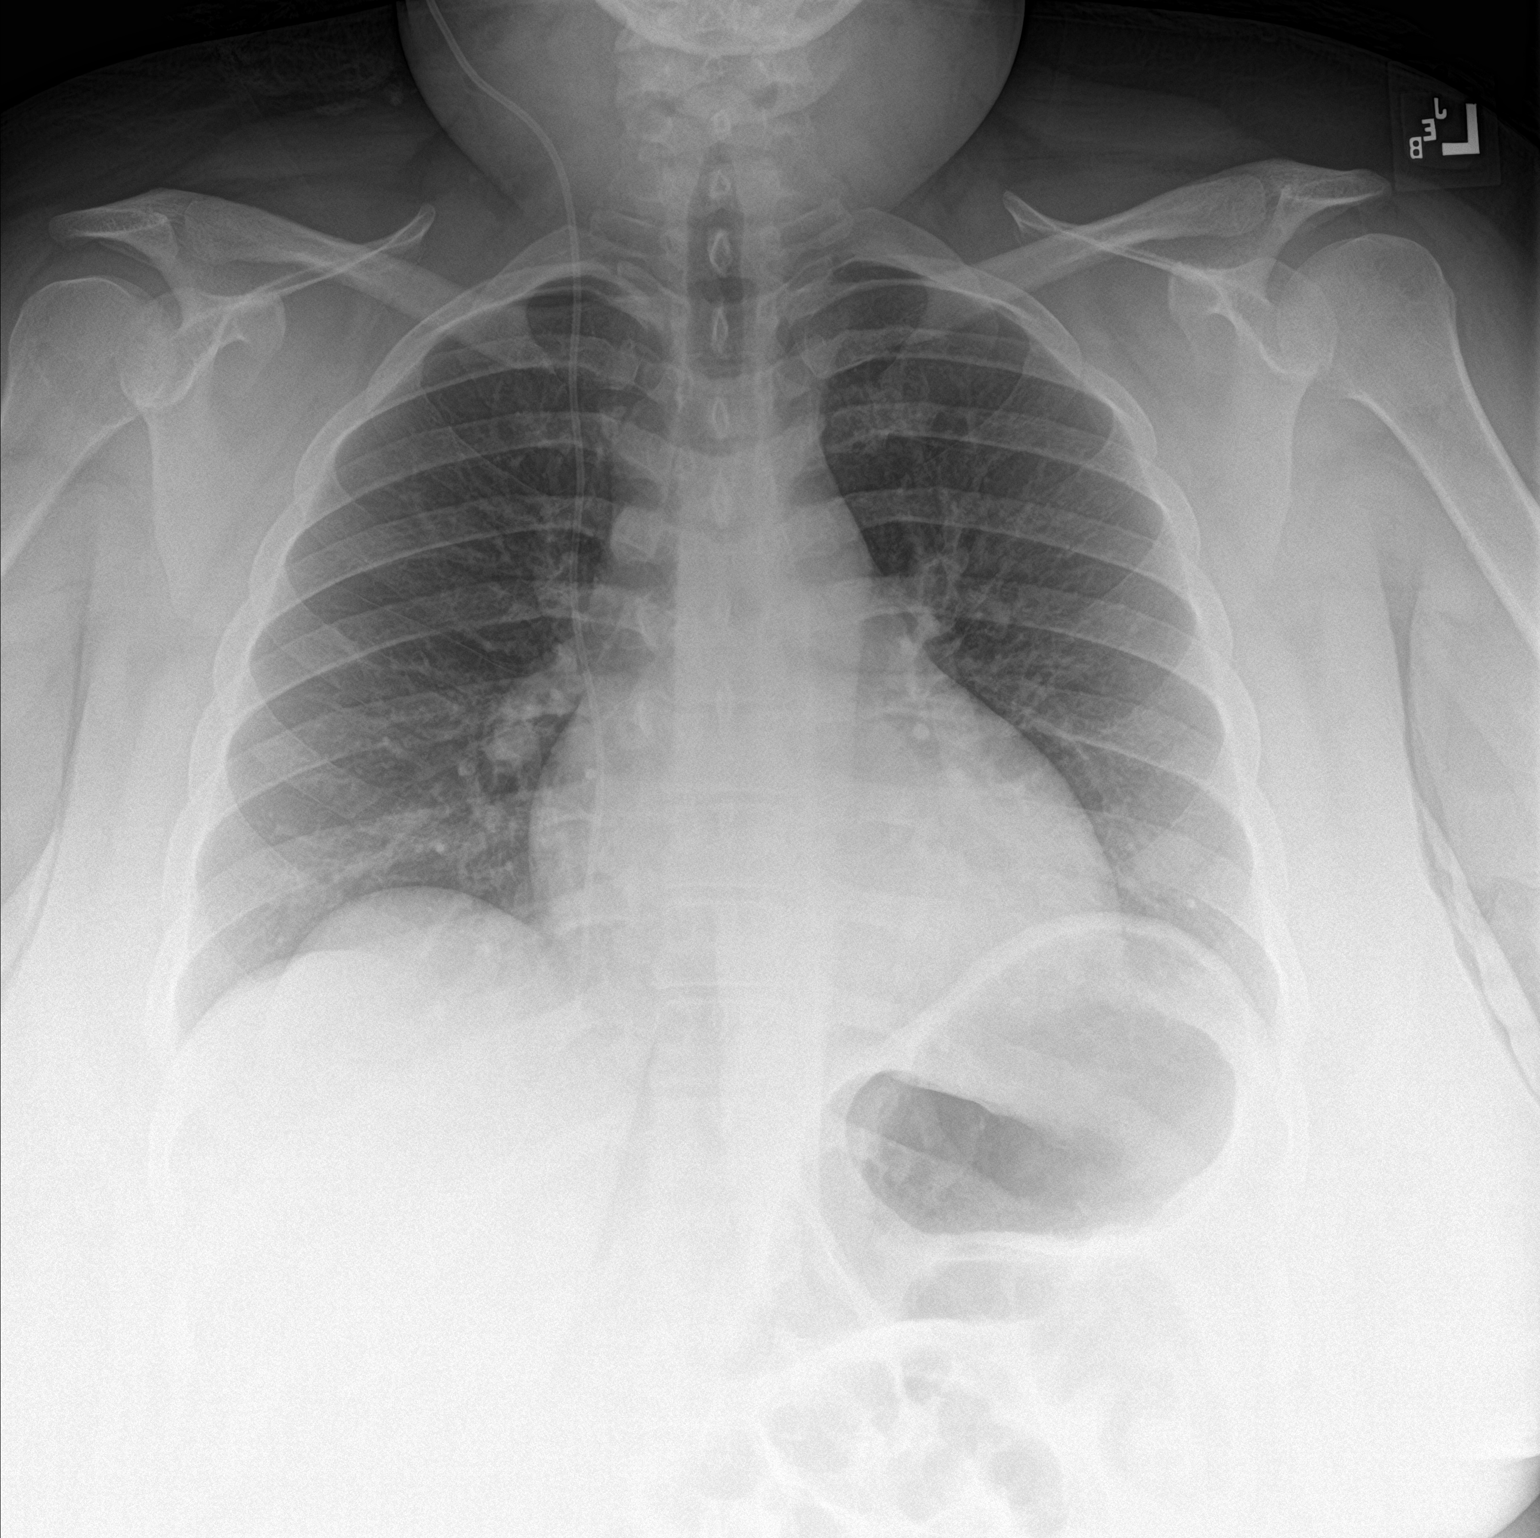

[chest lat]
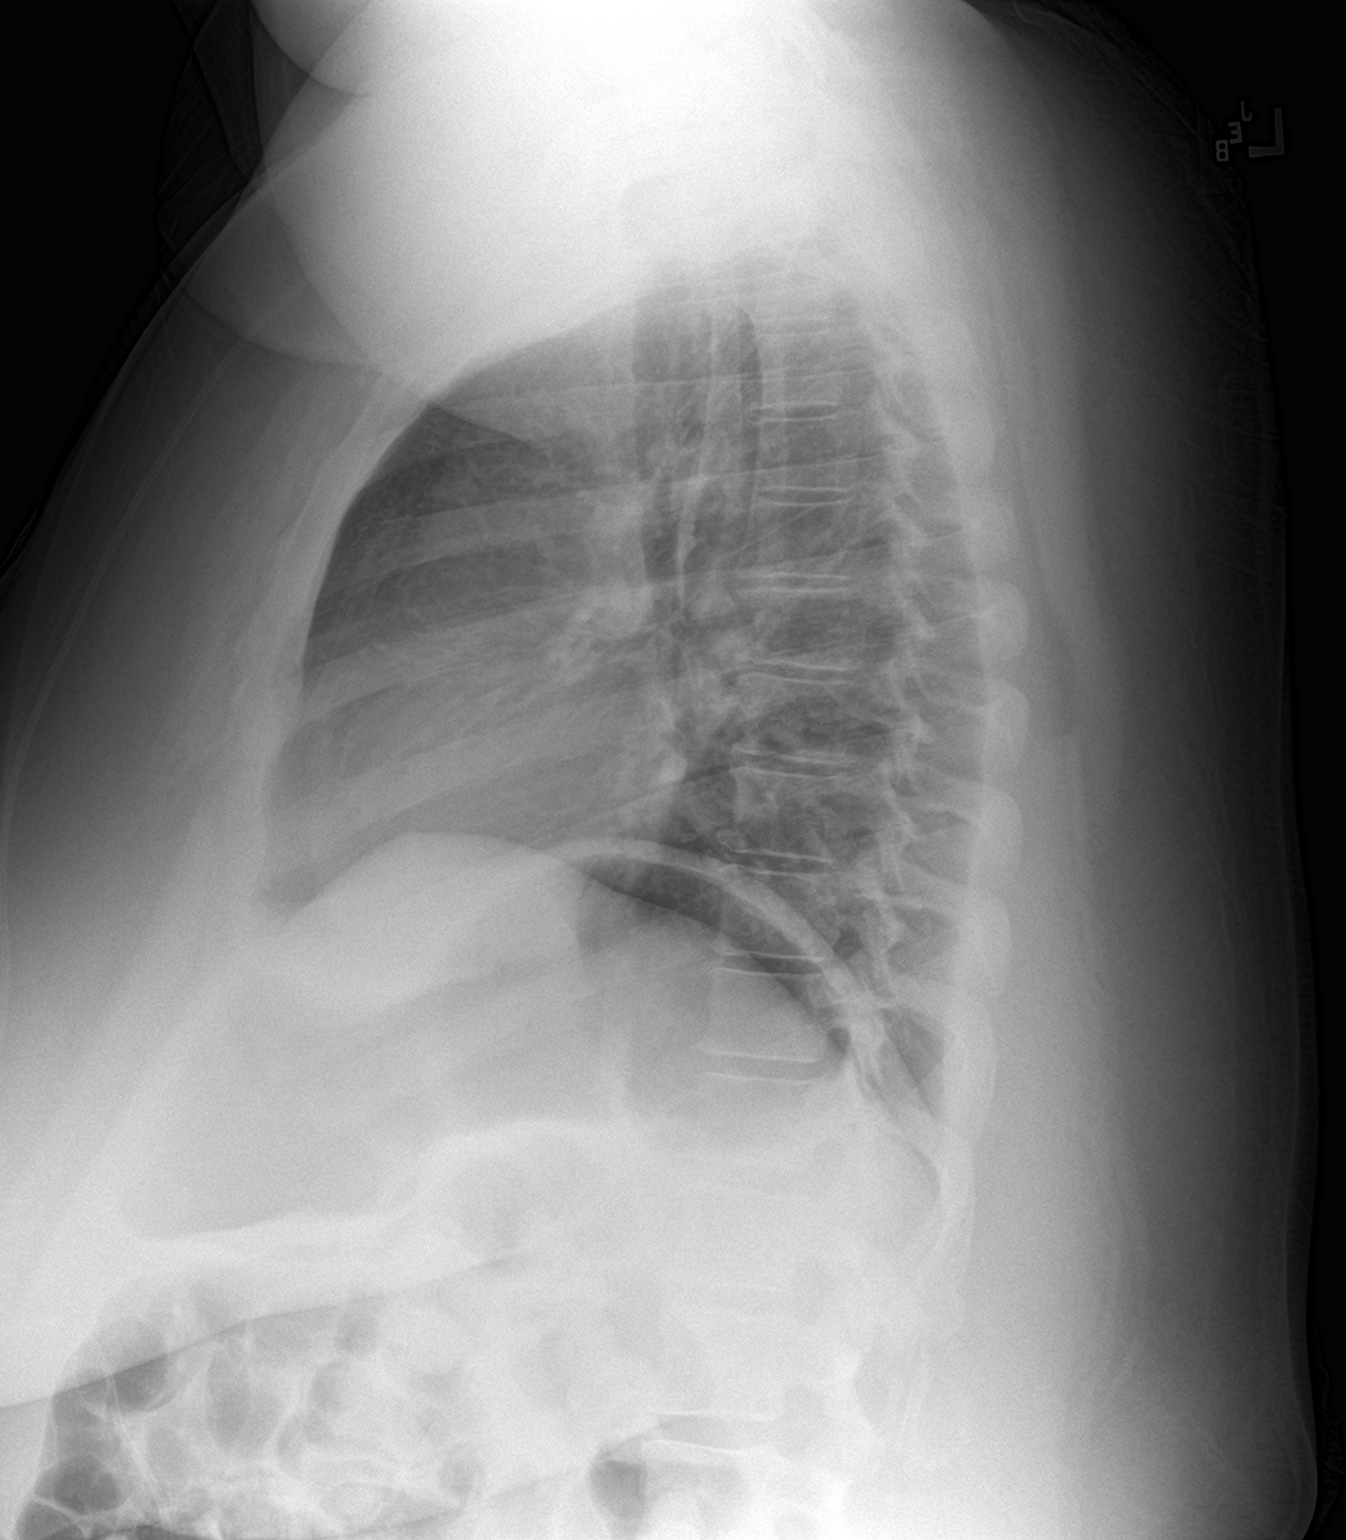

[2 of 2 positions shown; findings below may reference images not displayed]

FINDINGS: The heart remains at the upper limits of normal in size. Normal
mediastinal contours. Normal pulmonary vascularity. No focal
consolidation, pleural effusion, or pneumothorax. No acute osseous
abnormality. Unchanged VP shunt catheter tubing overlying the right
chest.
IMPRESSION: 1. No acute cardiopulmonary disease.

## 2023-05-26 LAB — HEPATITIS C ANTIBODY
HCV Ab: NEGATIVE
HCV Ab: NEGATIVE

## 2023-05-26 LAB — OB RESULTS CONSOLE GC/CHLAMYDIA
Chlamydia: NEGATIVE
Neisseria Gonorrhea: NEGATIVE

## 2023-05-26 LAB — OB RESULTS CONSOLE HEPATITIS B SURFACE ANTIGEN: Hepatitis B Surface Ag: NEGATIVE

## 2023-05-26 LAB — OB RESULTS CONSOLE RUBELLA ANTIBODY, IGM: Rubella: IMMUNE

## 2023-05-26 LAB — OB RESULTS CONSOLE HIV ANTIBODY (ROUTINE TESTING): HIV: NONREACTIVE

## 2023-07-03 ENCOUNTER — Other Ambulatory Visit: Payer: Self-pay

## 2023-07-03 ENCOUNTER — Inpatient Hospital Stay (HOSPITAL_COMMUNITY)
Admission: AD | Admit: 2023-07-03 | Discharge: 2023-07-03 | Disposition: A | Discharge status: Home / Self Care | Payer: 59 | Attending: Obstetrics and Gynecology | Admitting: Obstetrics and Gynecology

## 2023-07-03 ENCOUNTER — Encounter (HOSPITAL_COMMUNITY): Payer: Self-pay | Admitting: Obstetrics and Gynecology

## 2023-07-03 DIAGNOSIS — Z8619 Personal history of other infectious and parasitic diseases: Secondary | ICD-10-CM | POA: Diagnosis not present

## 2023-07-03 DIAGNOSIS — O26892 Other specified pregnancy related conditions, second trimester: Secondary | ICD-10-CM | POA: Diagnosis present

## 2023-07-03 DIAGNOSIS — B9689 Other specified bacterial agents as the cause of diseases classified elsewhere: Secondary | ICD-10-CM | POA: Insufficient documentation

## 2023-07-03 DIAGNOSIS — Z3A17 17 weeks gestation of pregnancy: Secondary | ICD-10-CM | POA: Diagnosis not present

## 2023-07-03 DIAGNOSIS — N760 Acute vaginitis: Secondary | ICD-10-CM

## 2023-07-03 DIAGNOSIS — O23592 Infection of other part of genital tract in pregnancy, second trimester: Secondary | ICD-10-CM | POA: Diagnosis not present

## 2023-07-03 LAB — WET PREP, GENITAL
Sperm: NONE SEEN
Trich, Wet Prep: NONE SEEN
WBC, Wet Prep HPF POC: <10 (ref <10)
Yeast Wet Prep HPF POC: NONE SEEN

## 2023-07-03 LAB — URINALYSIS, ROUTINE W REFLEX MICROSCOPIC
Bilirubin Urine: NEGATIVE
Glucose, UA: NEGATIVE mg/dL
Hgb urine dipstick: NEGATIVE
Ketones, ur: NEGATIVE mg/dL
Leukocytes,Ua: NEGATIVE
Nitrite: NEGATIVE
Protein, ur: NEGATIVE mg/dL
Specific Gravity, Urine: 1.009 (ref 1.005–1.030)
pH: 6 (ref 5.0–8.0)

## 2023-07-03 MED ORDER — METRONIDAZOLE 500 MG PO TABS: 500.0000 mg | ORAL_TABLET | Freq: Two times a day (BID) | ORAL | 0 refills | Status: DC

## 2023-07-03 NOTE — MAU Note (Signed)
Anna Morrison is a 37 y.o. at Unknown here in MAU reporting: she has clear white vaginal discharge, denies odor or VB.  Also reports frequent urination, denies pain or burning with urination.  Reports had possible yeast infection, treated with OTC meds, symptoms didn't resolve. LMP: March 2024 Onset of complaint: 1.5 weeks Pain score: 0 Vitals:   07/03/23 1541  BP: 130/75  Pulse: (!) 101  Resp: 18  Temp: 98 F (36.7 C)  SpO2: 99%     FHT:155 bpm Lab orders placed from triage:   UA

## 2023-07-03 NOTE — Discharge Instructions (Signed)

## 2023-07-03 NOTE — MAU Provider Note (Signed)
History     CSN: [id number]  Arrival date and time: 07/03/23 1523   Event Date/Time   First Provider Initiated Contact with Patient 07/03/23 1619      Chief Complaint  Patient presents with   Vaginal Discharge   Frequent Urination   HPI  Anna Morrison is a 37 y.o. G1P0 at [redacted]w[redacted]d who presents for evaluation of vaginal discharge. Patient reports she is having an increase in discharge, irritation and odor. She self treated for yeast which helped but she is still having symptoms. She also reports urinary frequency but no dysuria. She denies any vaginal bleeding and leaking of fluid. Denies any constipation, diarrhea or any urinary complaints. Reports normal fetal movement.   OB History     Gravida  1   Para      Term      Preterm      AB      Living         SAB      IAB      Ectopic      Multiple      Live Births              Past Medical History:  Diagnosis Date   Anemia    low iron   Anxiety    Chronic constipation    Elevated BP without diagnosis of hypertension    GERD (gastroesophageal reflux disease)    Headache    Nasofrontal encephalocele (HCC)    hx/notes 10/09/2018   Nocturnal hypoxemia    Obesity    Palpitations    Pneumonia    Trichomonal vaginitis    Vaginal yeast infection     Past Surgical History:  Procedure Laterality Date   APPLICATION OF CRANIAL NAVIGATION N/A 07/07/2021   Procedure: APPLICATION OF CRANIAL NAVIGATION;  Surgeon: Jadene Pierini, MD;  Location: [facility] OR;  Service: Neurosurgery;  Laterality: N/A;   COLONOSCOPY WITH PROPOFOL N/A 05/30/2018   Procedure: COLONOSCOPY WITH PROPOFOL;  Surgeon: Lynann Bologna, MD;  Location: [facility] END[facility]COPY;  Service: Endoscopy;  Laterality: N/A;   Endoscopic endonasal repair of ethmoidal encephalocele  10/09/2018   ETHMOIDECTOMY N/A 10/09/2018   Procedure: ETHMOIDECTOMY;  Surgeon: [name], MD;  Location: [facility] OR;  Service: ENT;  Laterality: N/A;   LAPAROSCOPY Right 07/07/2021    Procedure: LAPAROSCOPY DIAGNOSTIC;  Surgeon: Quentin Ore, MD;  Location: [facility] OR;  Service: General;  Laterality: Right;   NASAL SINUS SURGERY N/A 10/09/2018   Procedure: NASAL ENDOSCOPY ,REPAIR OF FRONTAL ENCEPHALOCELE,HARVEST OF SEPTO BONE/CARTLEDGE;  Surgeon: [name], MD;  Location: [facility] OR;  Service: ENT;  Laterality: N/A;   NASAL SINUS SURGERY N/A 10/31/2019   Procedure: ENDOSCOPIC REPAIR OF CEREBROSPINAL FLUID LEAK;  Surgeon: [name], MD;  Location: [facility] OR;  Service: ENT;  Laterality: N/A;  ENDOSCOPIC REPAIR OF CEREBROSPINAL FLUID LEAK   PLACEMENT OF LUMBAR DRAIN N/A 10/31/2019   Procedure: PLACEMENT OF LUMBAR DRAIN;  Surgeon: Jadene Pierini, MD;  Location: [facility] OR;  Service: Neurosurgery;  Laterality: N/A;   REPAIR OF CEREBROSPINAL FLUID LEAK N/A 10/09/2018   Procedure: Endoscopic endonasal repair of ethmoidal encephalocele;  Surgeon: Jadene Pierini, MD;  Location: [facility] OR;  Service: Neurosurgery;  Laterality: N/A;   REPAIR OF CEREBROSPINAL FLUID LEAK N/A 10/31/2019   Procedure: Endonasal repair of cerebrospinal fluid leak with placement of lumbar drain;  Surgeon: Jadene Pierini, MD;  Location: [facility] OR;  Service: Neurosurgery;  Laterality: N/A;  Endonasal repair of cerebrospinal fluid  leak with placement of lumbar drain   VENTRICULOPERITONEAL SHUNT Right 07/07/2021   Procedure: Right Ventriculoperitoneal shunt;  Surgeon: Jadene Pierini, MD;  Location: [facility] OR;  Service: Neurosurgery;  Laterality: Right;  RM 20    Family History  Problem Relation Age of Onset   Healthy Mother     Social History   Tobacco Use   Smoking status: Never   Smokeless tobacco: Never  Vaping Use   Vaping status: Never Used  Substance Use Topics   Alcohol use: Not Currently    Comment: 10/09/2018 "couple glasses of wine/month"   Drug use: Never    Allergies: No Known Allergies  Medications Prior to Admission  Medication Sig Dispense Refill Last Dose   CALCIUM PO Take 1 tablet  by mouth daily.      docusate sodium (COLACE) 100 MG capsule Take 100 mg by mouth daily as needed for moderate constipation.      HYDROcodone-acetaminophen (NORCO/VICODIN) 5-325 MG tablet Take 1 tablet by mouth every 4 (four) hours as needed for moderate pain. (Patient not taking: Reported on 01/08/2022) 30 tablet 0    ibuprofen (ADVIL) 200 MG tablet Take 400 mg by mouth every 6 (six) hours as needed for moderate pain or headache.      lidocaine (LIDODERM) 5 % Place 1 patch onto the skin daily. Remove & Discard patch within 12 hours or as directed by MD 30 patch 0    Multiple Vitamin (MULTI-VITAMIN DAILY PO) Take 1 tablet by mouth daily.      naproxen (NAPROSYN) 375 MG tablet Take 1 tablet (375 mg total) by mouth 2 (two) times daily. 20 tablet 0    ursodiol (ACTIGALL) 250 MG tablet Take 250 mg by mouth 2 (two) times daily.       Review of Systems  Constitutional: Negative.  Negative for fatigue and fever.  HENT: Negative.    Respiratory: Negative.  Negative for shortness of breath.   Cardiovascular: Negative.  Negative for chest pain.  Gastrointestinal: Negative.  Negative for abdominal pain, constipation, diarrhea, nausea and vomiting.  Genitourinary:  Positive for vaginal discharge. Negative for dysuria and vaginal bleeding.  Neurological: Negative.  Negative for dizziness and headaches.   Physical Exam   Blood pressure 130/75, pulse (!) 101, temperature 98 F (36.7 C), temperature source Oral, resp. rate 18, height 5\' 1"  (1.549 m), weight 96.8 kg, SpO2 99%.  Patient Vitals for the past 24 hrs:  BP Temp Temp src Pulse Resp SpO2 Height Weight  07/03/23 1541 130/75 98 F (36.7 C) Oral (!) 101 18 99 % -- --  07/03/23 1535 -- -- -- -- -- -- 5\' 1"  (1.549 m) 96.8 kg    Physical Exam Vitals and nursing note reviewed.  Constitutional:      General: She is not in acute distress.    Appearance: She is well-developed.  HENT:     Head: Normocephalic.  Eyes:     Pupils: Pupils are equal,  round, and reactive to light.  Cardiovascular:     Rate and Rhythm: Normal rate and regular rhythm.     Heart sounds: Normal heart sounds.  Pulmonary:     Effort: Pulmonary effort is normal. No respiratory distress.     Breath sounds: Normal breath sounds.  Abdominal:     General: Bowel sounds are normal. There is no distension.     Palpations: Abdomen is soft.     Tenderness: There is no abdominal tenderness.  Skin:    General: Skin  is warm and dry.  Neurological:     Mental Status: She is alert and oriented to person, place, and time.     Motor: No abnormal muscle tone.     Coordination: Coordination normal.     Deep Tendon Reflexes: Reflexes are normal and symmetric. Reflexes normal.  Psychiatric:        Mood and Affect: Mood normal.        Behavior: Behavior normal.        Thought Content: Thought content normal.        Judgment: Judgment normal.     Fetal Tracing:  Baseline: Variability: Accels: Decels:  Toco:      MAU Course  Procedures  Results for orders placed or performed during the hospital encounter of 07/03/23 (from the past 24 hour(s))  Urinalysis, Routine w reflex microscopic -Urine, Clean Catch     Status: None   Collection Time: 07/03/23  3:57 PM  Result Value Ref Range   Color, Urine YELLOW YELLOW   APPearance CLEAR CLEAR   Specific Gravity, Urine 1.009 1.005 - 1.030   pH 6.0 5.0 - 8.0   Glucose, UA NEGATIVE NEGATIVE mg/dL   Hgb urine dipstick NEGATIVE NEGATIVE   Bilirubin Urine NEGATIVE NEGATIVE   Ketones, ur NEGATIVE NEGATIVE mg/dL   Protein, ur NEGATIVE NEGATIVE mg/dL   Nitrite NEGATIVE NEGATIVE   Leukocytes,Ua NEGATIVE NEGATIVE  Wet prep, genital     Status: Abnormal   Collection Time: 07/03/23  3:57 PM   Specimen: Urine, Clean Catch  Result Value Ref Range   Yeast Wet Prep HPF POC NONE SEEN NONE SEEN   Trich, Wet Prep NONE SEEN NONE SEEN   Clue Cells Wet Prep HPF POC PRESENT (A) NONE SEEN   WBC, Wet Prep HPF POC <10 <10   Sperm  NONE SEEN      MDM Labs ordered and reviewed.   UA Wet prep and gc/chlamydia Assessment and Plan   1. Bacterial vaginosis   2. [redacted] weeks gestation of pregnancy     -Discharge home in stable condition -Rx for metronidazole sent to pharmacy -Second trimester precautions discussed -Patient advised to follow-up with OB as scheduled for prenatal care -Patient may return to MAU as needed or if her condition were to change or worsen  Rolm Bookbinder, CNM 07/03/2023, 4:19 PM

## 2023-07-04 LAB — GC/CHLAMYDIA PROBE AMP (~~LOC~~) NOT AT ARMC
Chlamydia: NEGATIVE
Comment: NEGATIVE
Comment: NORMAL
Neisseria Gonorrhea: NEGATIVE

## 2023-09-16 LAB — OB RESULTS CONSOLE RPR: RPR: NONREACTIVE

## 2023-09-19 ENCOUNTER — Inpatient Hospital Stay (HOSPITAL_COMMUNITY): Payer: No Typology Code available for payment source

## 2023-09-19 ENCOUNTER — Other Ambulatory Visit: Payer: Self-pay

## 2023-09-19 ENCOUNTER — Encounter (HOSPITAL_COMMUNITY): Payer: Self-pay | Admitting: Obstetrics and Gynecology

## 2023-09-19 ENCOUNTER — Observation Stay (HOSPITAL_COMMUNITY)
Admission: AD | Admit: 2023-09-19 | Discharge: 2023-09-20 | Disposition: A | Discharge status: Home / Self Care | Payer: No Typology Code available for payment source | Attending: Obstetrics and Gynecology | Admitting: Obstetrics and Gynecology

## 2023-09-19 DIAGNOSIS — O9A213 Injury, poisoning and certain other consequences of external causes complicating pregnancy, third trimester: Secondary | ICD-10-CM

## 2023-09-19 DIAGNOSIS — Z3A28 28 weeks gestation of pregnancy: Secondary | ICD-10-CM | POA: Insufficient documentation

## 2023-09-19 DIAGNOSIS — O09513 Supervision of elderly primigravida, third trimester: Secondary | ICD-10-CM

## 2023-09-19 DIAGNOSIS — E669 Obesity, unspecified: Secondary | ICD-10-CM

## 2023-09-19 DIAGNOSIS — O26643 Intrahepatic cholestasis of pregnancy, third trimester: Secondary | ICD-10-CM | POA: Diagnosis not present

## 2023-09-19 DIAGNOSIS — O26813 Pregnancy related exhaustion and fatigue, third trimester: Secondary | ICD-10-CM | POA: Diagnosis present

## 2023-09-19 DIAGNOSIS — O09523 Supervision of elderly multigravida, third trimester: Secondary | ICD-10-CM | POA: Diagnosis not present

## 2023-09-19 DIAGNOSIS — O99213 Obesity complicating pregnancy, third trimester: Secondary | ICD-10-CM

## 2023-09-19 DIAGNOSIS — R55 Syncope and collapse: Principal | ICD-10-CM | POA: Diagnosis present

## 2023-09-19 LAB — COMPREHENSIVE METABOLIC PANEL
ALT: 148 U/L — ABNORMAL HIGH (ref 0–44)
AST: 89 U/L — ABNORMAL HIGH (ref 15–41)
Albumin: 2.6 g/dL — ABNORMAL LOW (ref 3.5–5.0)
Alkaline Phosphatase: 207 U/L — ABNORMAL HIGH (ref 38–126)
Anion gap: 8 (ref 5–15)
BUN: 6 mg/dL (ref 6–20)
CO2: 23 mmol/L (ref 22–32)
Calcium: 9.2 mg/dL (ref 8.9–10.3)
Chloride: 104 mmol/L (ref 98–111)
Creatinine, Ser: 0.56 mg/dL (ref 0.44–1.00)
GFR, Estimated: >60 mL/min (ref >60)
Glucose, Bld: 75 mg/dL (ref 70–99)
Potassium: 4.1 mmol/L (ref 3.5–5.1)
Sodium: 135 mmol/L (ref 135–145)
Total Bilirubin: 1.1 mg/dL (ref 0.3–1.2)
Total Protein: 6.9 g/dL (ref 6.5–8.1)

## 2023-09-19 LAB — PROTEIN / CREATININE RATIO, URINE
Creatinine, Urine: 294 mg/dL
Protein Creatinine Ratio: 0.86 mg/mg{creat} — ABNORMAL HIGH (ref 0.00–0.15)
Total Protein, Urine: 253 mg/dL

## 2023-09-19 LAB — URINALYSIS, ROUTINE W REFLEX MICROSCOPIC
Bilirubin Urine: NEGATIVE
Glucose, UA: NEGATIVE mg/dL
Hgb urine dipstick: NEGATIVE
Ketones, ur: NEGATIVE mg/dL
Leukocytes,Ua: NEGATIVE
Nitrite: NEGATIVE
Protein, ur: 100 mg/dL — AB
Specific Gravity, Urine: 1.028 (ref 1.005–1.030)
pH: 5 (ref 5.0–8.0)

## 2023-09-19 LAB — CBC
HCT: 38.9 % (ref 36.0–46.0)
Hemoglobin: 12.1 g/dL (ref 12.0–15.0)
MCH: 27.2 pg (ref 26.0–34.0)
MCHC: 31.1 g/dL (ref 30.0–36.0)
MCV: 87.4 fL (ref 80.0–100.0)
Platelets: 295 10*3/uL (ref 150–400)
RBC: 4.45 MIL/uL (ref 3.87–5.11)
RDW: 12.4 % (ref 11.5–15.5)
WBC: 8.9 10*3/uL (ref 4.0–10.5)
nRBC: 0 % (ref 0.0–0.2)

## 2023-09-19 MED ORDER — CALCIUM CARBONATE ANTACID 500 MG PO CHEW: 2.0000 | CHEWABLE_TABLET | ORAL | Status: DC | PRN

## 2023-09-19 MED ORDER — ZOLPIDEM TARTRATE 5 MG PO TABS: 5.0000 mg | ORAL_TABLET | Freq: Every evening | ORAL | Status: DC | PRN

## 2023-09-19 MED ORDER — PRENATAL MULTIVITAMIN CH: 1.0000 | ORAL_TABLET | Freq: Every day | ORAL | Status: DC

## 2023-09-19 MED ORDER — LACTATED RINGERS IV SOLN: 125.0000 mL/h | INTRAVENOUS | Status: DC

## 2023-09-19 MED ORDER — ACETAMINOPHEN 325 MG PO TABS: 650.0000 mg | ORAL_TABLET | ORAL | Status: DC | PRN

## 2023-09-19 MED ORDER — DOCUSATE SODIUM 100 MG PO CAPS: 100.0000 mg | ORAL_CAPSULE | Freq: Every day | ORAL | Status: DC

## 2023-09-19 MED ORDER — LACTATED RINGERS IV BOLUS
1000.0000 mL | Freq: Once | INTRAVENOUS | Status: AC
  Administered 2023-09-19: 1000 mL via INTRAVENOUS

## 2023-09-19 NOTE — Progress Notes (Signed)
Correction to MAU provider note: patient WITHOUT abd TTP or rebound tenderness.

## 2023-09-19 NOTE — H&P (Signed)
Anna Morrison is a 37 y.o.prime female at 15 4/7wks presenting after near syncopal episode at work. Pt was guided to the ground by co-workers. No abdominal injury. She arrived via EMS to hospital. They reported an initial BP of 150/100 however her BP remained in normal range in MAU. Given report of elev BP, preE labs were obtained.  Pt had just recently ( 9/27)  been diagnosed with cholestasis of pregnancy based on bile acid of 12. Her ALT was noted to be 55 in 08/02/23. On ursodiol 300mg  po bid  Of note her 1hr gtt on 09/16/23 was also elevated at 154 and she is scheduled for a 3hr gtt  Pt has a history of gastric bypass. On baby asa OB History     Gravida  1   Para      Term      Preterm      AB      Living         SAB      IAB      Ectopic      Multiple      Live Births             Past Medical History:  Diagnosis Date   Anemia    low iron   Anxiety    Chronic constipation    Elevated BP without diagnosis of hypertension    GERD (gastroesophageal reflux disease)    Headache    Nasofrontal encephalocele (HCC)    hx/notes 10/09/2018   Nocturnal hypoxemia    Obesity    Palpitations    Pneumonia    Trichomonal vaginitis    Vaginal yeast infection    Past Surgical History:  Procedure Laterality Date   APPLICATION OF CRANIAL NAVIGATION N/A 07/07/2021   Procedure: APPLICATION OF CRANIAL NAVIGATION;  Surgeon: Jadene Pierini, MD;  Location: [facility];  Service: Neurosurgery;  Laterality: N/A;   COLONOSCOPY WITH PROPOFOL N/A 05/30/2018   Procedure: COLONOSCOPY WITH PROPOFOL;  Surgeon: Lynann Bologna, MD;  Location: [facility];  Service: Endoscopy;  Laterality: N/A;   Endoscopic endonasal repair of ethmoidal encephalocele  10/09/2018   ETHMOIDECTOMY N/A 10/09/2018   Procedure: ETHMOIDECTOMY;  Surgeon: [name], MD;  Location: [facility] OR;  Service: ENT;  Laterality: N/A;   LAPAROSCOPIC GASTRIC SLEEVE RESECTION  01/2022   LAPAROSCOPY Right 07/07/2021    Procedure: LAPAROSCOPY DIAGNOSTIC;  Surgeon: Quentin Ore, MD;  Location: [facility] OR;  Service: General;  Laterality: Right;   NASAL SINUS SURGERY N/A 10/09/2018   Procedure: NASAL ENDOSCOPY ,REPAIR OF FRONTAL ENCEPHALOCELE,HARVEST OF SEPTO BONE/CARTLEDGE;  Surgeon: [name], MD;  Location: [facility] OR;  Service: ENT;  Laterality: N/A;   NASAL SINUS SURGERY N/A 10/31/2019   Procedure: ENDOSCOPIC REPAIR OF CEREBROSPINAL FLUID LEAK;  Surgeon: [name], MD;  Location: [facility] OR;  Service: ENT;  Laterality: N/A;  ENDOSCOPIC REPAIR OF CEREBROSPINAL FLUID LEAK   PLACEMENT OF LUMBAR DRAIN N/A 10/31/2019   Procedure: PLACEMENT OF LUMBAR DRAIN;  Surgeon: Jadene Pierini, MD;  Location: [facility];  Service: Neurosurgery;  Laterality: N/A;   REPAIR OF CEREBROSPINAL FLUID LEAK N/A 10/09/2018   Procedure: Endoscopic endonasal repair of ethmoidal encephalocele;  Surgeon: Jadene Pierini, MD;  Location: [facility];  Service: Neurosurgery;  Laterality: N/A;   REPAIR OF CEREBROSPINAL FLUID LEAK N/A 10/31/2019   Procedure: Endonasal repair of cerebrospinal fluid leak with placement of lumbar drain;  Surgeon: Jadene Pierini, MD;  Location: [facility];  Service: Neurosurgery;  Laterality: N/A;  Endonasal repair of cerebrospinal fluid leak with placement of lumbar drain   VENTRICULOPERITONEAL SHUNT Right 07/07/2021   Procedure: Right Ventriculoperitoneal shunt;  Surgeon: Jadene Pierini, MD;  Location: [facility];  Service: Neurosurgery;  Laterality: Right;  RM 20   Family History: family history includes Healthy in her mother. Social History:  reports that she has never smoked. She has never used smokeless tobacco. She reports that she does not currently use alcohol. She reports that she does not use drugs.     Maternal Diabetes: No Genetic Screening: Abnormal:  Results: Elevated AFP NIPT expected range  Maternal Ultrasounds/Referrals: Normal Fetal Ultrasounds or other Referrals:  None Maternal Substance Abuse:   No Significant Maternal Medications:  Meds include: Other:  ursodiol  Significant Maternal Lab Results:  Other: see hpi Number of Prenatal Visits:greater than 3 verified prenatal visits Maternal Vaccinations:none Other Comments:  None  Review of Systems  Constitutional:  Positive for activity change, appetite change and fatigue.  Eyes:  Negative for photophobia and visual disturbance.  Respiratory:  Negative for chest tightness and shortness of breath.   Cardiovascular:  Negative for chest pain and palpitations.  Gastrointestinal:  Negative for abdominal pain.  Genitourinary:  Negative for pelvic pain.  Musculoskeletal:  Negative for back pain.  Neurological:  Positive for syncope and light-headedness.  Psychiatric/Behavioral:  The patient is not nervous/anxious.    Maternal Medical History:  Reason for admission: observation  Fetal activity: Perceived fetal activity is normal.   Prenatal complications: PIH.   Prenatal Complications - Diabetes: Abnormal 1hr gtt    Blood pressure 119/73, pulse 92, temperature 98.3 F (36.8 C), temperature source Oral, resp. rate 18, SpO2 98%. Maternal Exam:  Uterine Assessment: Contraction frequency is rare.  Abdomen: Estimated fetal weight is AGA.   Cervix: not evaluated.   Fetal Exam Fetal Monitor Review: Baseline rate: 142.  Fetal State Assessment: Category I - tracings are normal.   Physical Exam Vitals and nursing note reviewed. Exam conducted with a chaperone present.  Constitutional:      Appearance: She is obese.  Cardiovascular:     Rate and Rhythm: Normal rate.     Pulses: Normal pulses.  Pulmonary:     Effort: Pulmonary effort is normal.  Musculoskeletal:     Cervical back: Normal range of motion.  Skin:    General: Skin is warm.     Capillary Refill: Capillary refill takes 2 to 3 seconds.  Neurological:     General: No focal deficit present.     Mental Status: She is alert and oriented to person, place, and time.  Mental status is at baseline.  Psychiatric:        Mood and Affect: Mood normal.        Behavior: Behavior normal.        Thought Content: Thought content normal.        Judgment: Judgment normal.     Prenatal labs: ABO, Rh:   Antibody:   Rubella:   RPR:    HBsAg:    HIV:    GBS:     Assessment/Plan: 37yo prime at 41 4/7wks with near syncopal episode x 1 today , cholestasis of pregnancy and elevated 1hr gtt - Admitted overnight for monitoring  - Serial BPs - if elevated will treat and consider magnesium sulfate for neuro-protection - Will plan on 3hr gtt in am  - Discharge home if BP stable with close interval follow up    Chari Parmenter W Charon Smedberg  09/19/2023, 8:05 PM

## 2023-09-19 NOTE — MAU Provider Note (Addendum)
History    Chief Complaint  Patient presents with   Near Syncope   HPI Anna Morrison is a 37 y.o. G1P0 at [redacted]w[redacted]d who presents today via EMS for syncopal episode. 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 She told one of her coworkers and then remembers being on the ground. Per EMS, she was eased down to the ground but patient reports falling to the ground. She does not recall if she hit her abdomen. She did not hit her head per her account. Fall was witnessed, but all she was told was there was no tonic-clonic movements and she did not hit her head. After she regained consciousness, she reports feeling back to her baseline. EMS arrived and checked her BP which was 150/100 and BG was normal. She denies fevers chills, increased stress, seizure history, chest pain, headache, vision changes, RUQ pain, dyspnea, edema. She endorses palpitations. Reports eating grapes, grits, and maybe a little bit of water earlier.   Past Medical History:  Diagnosis Date   Anemia    low iron   Anxiety    Chronic constipation    Elevated BP without diagnosis of hypertension    GERD (gastroesophageal reflux disease)    Headache    Nasofrontal encephalocele (HCC)    hx/notes 10/09/2018   Nocturnal hypoxemia    Obesity    Palpitations    Pneumonia    Trichomonal vaginitis    Vaginal yeast infection     Past Surgical History:  Procedure Laterality Date   APPLICATION OF CRANIAL NAVIGATION N/A 07/07/2021   Procedure: APPLICATION OF CRANIAL NAVIGATION;  Surgeon: Jadene Pierini, MD;  [facility];  Service: Neurosurgery;  Laterality: N/A;   COLONOSCOPY WITH PROPOFOL N/A 05/30/2018   Procedure: COLONOSCOPY WITH PROPOFOL;  Surgeon: Lynann Bologna, MD;  [facility]: [facility];  Service: Endoscopy;  Laterality: N/A;   Endoscopic endonasal repair of ethmoidal encephalocele  10/09/2018   ETHMOIDECTOMY N/A 10/09/2018   Procedure:  ETHMOIDECTOMY;  Surgeon: [name], MD;  Location: [facility] OR;  Service: ENT;  Laterality: N/A;   LAPAROSCOPIC GASTRIC SLEEVE RESECTION  01/2022   LAPAROSCOPY Right 07/07/2021   Procedure: LAPAROSCOPY DIAGNOSTIC;  Surgeon: Quentin Ore, MD;  [facility];  Service: General;  Laterality: Right;   NASAL SINUS SURGERY N/A 10/09/2018   Procedure: NASAL ENDOSCOPY ,REPAIR OF FRONTAL ENCEPHALOCELE,HARVEST OF SEPTO BONE/CARTLEDGE;  Surgeon: [name], MD;  Location: [facility] OR;  Service: ENT;  Laterality: N/A;   NASAL SINUS SURGERY N/A 10/31/2019   Procedure: ENDOSCOPIC REPAIR OF CEREBROSPINAL FLUID LEAK;  Surgeon: [name], MD;  Location: [facility] OR;  Service: ENT;  Laterality: N/A;  ENDOSCOPIC REPAIR OF CEREBROSPINAL FLUID LEAK   PLACEMENT OF LUMBAR DRAIN N/A 10/31/2019   Procedure: PLACEMENT OF LUMBAR DRAIN;  Surgeon: Jadene Pierini, MD;  [facility];  Service: Neurosurgery;  Laterality: N/A;   REPAIR OF CEREBROSPINAL FLUID LEAK N/A 10/09/2018   Procedure: Endoscopic endonasal repair of ethmoidal encephalocele;  Surgeon: Jadene Pierini, MD;  Location[facility];  Service: Neurosurgery;  Laterality: N/A;   REPAIR OF CEREBROSPINAL FLUID LEAK N/A 10/31/2019   Procedure: Endonasal repair of cerebrospinal fluid leak with placement of lumbar drain;  Surgeon: Jadene Pierini, MD;  Location: [facility];  Service: Neurosurgery;  Laterality: N/A;  Endonasal repair of cerebrospinal fluid leak with placement of lumbar drain   VENTRICULOPERITONEAL SHUNT Right 07/07/2021   Procedure: Right Ventriculoperitoneal shunt;  Surgeon: Jadene Pierini, MD;  Location: [facility] OR;  Service: Neurosurgery;  Laterality: Right;  RM 20    Family History  Problem Relation Age of Onset   Healthy Mother     Social History   Tobacco Use   Smoking status: Never   Smokeless tobacco: Never  Vaping Use   Vaping status: Never Used  Substance Use Topics   Alcohol use: Not Currently    Comment: 10/09/2018 "couple  glasses of wine/month"   Drug use: Never    Allergies: No Known Allergies  Medications Prior to Admission  Medication Sig Dispense Refill Last Dose   aspirin 81 MG chewable tablet Chew 81 mg by mouth daily.      Multiple Vitamin (MULTI-VITAMIN DAILY PO) Take 1 tablet by mouth daily.   09/19/2023   CALCIUM PO Take 1 tablet by mouth daily.      docusate sodium (COLACE) 100 MG capsule Take 100 mg by mouth daily as needed for moderate constipation.      lidocaine (LIDODERM) 5 % Place 1 patch onto the skin daily. Remove & Discard patch within 12 hours or as directed by MD (Patient not taking: Reported on 09/19/2023) 30 patch 0 Not Taking   metroNIDAZOLE (FLAGYL) 500 MG tablet Take 1 tablet (500 mg total) by mouth 2 (two) times daily. 14 tablet 0    ROS reviewed and pertinent positives and negatives as documented in HPI.  Physical Exam Blood pressure 119/73, pulse 92, temperature 98.3 F (36.8 C), temperature source Oral, resp. rate 18, SpO2 98%. Physical Exam Constitutional:      General: She is not in acute distress.    Appearance: Normal appearance.  HENT:     Head: Normocephalic and atraumatic.  Cardiovascular:     Rate and Rhythm: Normal rate and regular rhythm.     Heart sounds: Normal heart sounds.  Pulmonary:     Effort: Pulmonary effort is normal. No respiratory distress.     Breath sounds: Normal breath sounds.  Abdominal:     General: There is no distension.     Palpations: Abdomen is soft.     Tenderness: There is abdominal tenderness. There is rebound (.diag). There is no right CVA tenderness or left CVA tenderness.  Musculoskeletal:        General: Normal range of motion.  Skin:    General: Skin is warm and dry.     Findings: No rash.  Neurological:     General: No focal deficit present.     Mental Status: She is alert and oriented to person, place, and time. Mental status is at baseline.  Psychiatric:        Mood and Affect: Mood normal.        Behavior: Behavior  normal.   EFM: 155/mod/no accels/no decels EKG reviewed -- NSR, LAE, normal intervals, axis, and no ST or T wave changes.  MAU Course 37 y.o. G1P0 at [redacted]w[redacted]d who presents following syncopal episode earlier. Feels triggered by heat, possibly be dehydration. On EMS arrival, found to have elevated BP, though BP's since have been wnl. 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 On arrival to MAU, VSS, exam reassuring. Labs notable for uptrending LFTs and PCR of 0.86. Given these findings in combination with elevated BP per EMS, concerning for possible pre-e with severe features. Case discussed with  Dr. Mindi Slicker, who recommends observation overnight. Will admit.   MDM Elevated BP  elevated LFTs  elevated protein/creatinine ratio: All reportedly new findings in preg -- last LFTs AST/ALT were 26/55 and today are 89/148. PCR elevated. Will admit to monitor closely.  Syncopal episode: Likely vasovagal (related to heat/situation) vs less likely but still possible would be cardiac (has palpitations and EKG with LAE) vs neurogenic (h/o CSF leak, s/p VP shunt)  symptoms resolved, but would recommend further w/up with OB-cardio and neuro.   Sundra Aland, MD OB Fellow, Faculty Practice [facility], Center for [facility]

## 2023-09-19 NOTE — MAU Note (Signed)
.  Anna Morrison is a 37 y.o. at [redacted]w[redacted]d here in MAU reporting: EMS arrival: Pt was at work she was outside and started feeling very hot and  got lightheaded. Coworker assisted her to the ground as she was feeling weak.  EMS called and b/p was elevated 150/100 when they took it  has come down to 130's/80's. CBG WNL.reports good fetal movement since the incident. Denies any pain or cramping.   Onset of complaint: 1 hour Pain score: 0 Vitals:   09/19/23 1157  BP: 131/77  Pulse: 99  Resp: 18  Temp: 98.8 F (37.1 C)     FHT:160 Lab orders placed from triage:  u/a

## 2023-09-20 DIAGNOSIS — O26813 Pregnancy related exhaustion and fatigue, third trimester: Secondary | ICD-10-CM | POA: Diagnosis not present

## 2023-09-20 LAB — TYPE AND SCREEN
ABO/RH(D): B POS
Antibody Screen: NEGATIVE

## 2023-09-20 LAB — GLUCOSE, FASTING GESTATIONAL: Glucose Tolerance, Fasting: 80 mg/dL

## 2023-09-20 MED ORDER — URSODIOL 300 MG PO CAPS
300.0000 mg | ORAL_CAPSULE | Freq: Two times a day (BID) | ORAL | Status: DC
  Filled 2023-09-20: qty 1

## 2023-09-20 NOTE — Progress Notes (Signed)
Anna Morrison 37 y.o. G1P0 at [redacted]w[redacted]d HD#0  admitted with near syncopal episode and reported BP of 150/100 via EMS S: Feeling well today.  Ambulating without difficulty.  No recurrent episodes of feeling lightheaded.  Reports good fetal movement  Did attempt 3hr gtt this AM and vomiting prior to completing drink  BPs have been normal since admission   Reports eating grapes and cheerios for breakfast around 0830/0900 yesterday.  Felt nauseous / dizzy around 1100 when outside at playground.  Typical breakfast is high in simple carbs.  O: Vitals:   09/19/23 1715 09/19/23 1815 09/19/23 2347 09/20/23 0431  BP: 114/72 119/73 135/81 113/62  Pulse: (!) 108 92 83 92  Resp:  18 17 16   Temp:  98.3 F (36.8 C) 98.5 F (36.9 C) 98.3 F (36.8 C)  TempSrc:  Oral Oral Oral  SpO2:  98% 99% 98%    NST 150s on admission.  FHTs this AM pending  A/p: Anna Morrison 37 y.o. G1P0 at [redacted]w[redacted]d HD#0 admitted with near syncope and reported one elevated BP by EMS BPs normal since admission.  OK for discharge to home Near syncope--feeling well since admission.  Work up normal.  Discussed dietary choice for breakfast--add protein / complex carbs Cholestasis: continue urosdiol and weekly testing.  Suspect elevated LFTs related to cholestasis.  Can continue to monitor as outpatient in office Failed 1hr gtt.  Will plan to start to monitor BG QID at home       [facility] GEFFEL [facility] 09/20/23 7:31 AM

## 2023-09-20 NOTE — Discharge Instructions (Signed)
Please check your blood sugar four times per day: Fasting (first thing in the morning prior to eating or drinking) After meals---either exactly 1 hour or exactly 2 hours after meals--please be consistent

## 2023-09-20 NOTE — Discharge Summary (Signed)
Discharge Summary  Date of Service updated  09/20/23     Patient Name: Anna Morrison DOB: Dec 15, 1986 MRN: [id number]  Date of admission: 09/19/2023 Delivery date:This patient has no babies on file. Delivering provider: This patient has no babies on file. Date of discharge: 09/20/2023  Admitting diagnosis: Near syncope [R55] Intrauterine pregnancy: [redacted]w[redacted]d     Secondary diagnosis:  Principal Problem:   Near syncope  Additional problems: cholestasis of pregnancy, history of gastric sleeve    Discharge diagnosis:  near syncope                                                 Hospital course:  37 yo G1 @ [redacted]w[redacted]d presented after a near syncopal episode at work.  She arrived via EMS to hospital. They reported an initial BP of 150/100 however her BP remained in normal range in MAU.  Preeclampsia labs were obtained and notable for elevated LFTs.  Of note, she does have a diagnosis of cholestasis of pregnancy and is on ursodiol.  She was admitted for blood pressure monitoring overnight.  BPs remained normal range.  She felt well and was able to ambulate without difficulty.  Her planned 3 hour gtt was ordered, but she vomited prior to completing the drink  On HD#1, she was discharged with the following plan F/u in office as scheduled for NST on 10/4 To start checking blood glucose QID.  Was instructed on how to do so prior to discharge Monitor LFTs in the setting of cholestasis as an outpatient--will trend and work up further as needed  Magnesium Sulfate received: No BMZ received: No   There is no immunization history on file for this patient.  Physical exam  Vitals:   09/19/23 1815 09/19/23 2347 09/20/23 0431 09/20/23 0733  BP: 119/73 135/81 113/62 115/70  Pulse: 92 83 92 (!) 108  Resp: 18 17 16 16   Temp: 98.3 F (36.8 C) 98.5 F (36.9 C) 98.3 F (36.8 C) 98.2 F (36.8 C)  TempSrc: Oral Oral Oral Oral  SpO2: 98% 99% 98% 100%   General: alert, cooperative, and no  distress  Labs: Lab Results  Component Value Date   WBC 8.9 09/19/2023   HGB 12.1 09/19/2023   HCT 38.9 09/19/2023   MCV 87.4 09/19/2023   PLT 295 09/19/2023      Latest Ref Rng & Units 09/20/2023    5:51 AM  CMP  Glucose mg/dL 80     After visit meds:  Allergies as of 09/20/2023   No Known Allergies      Medication List     STOP taking these medications    CALCIUM PO   lidocaine 5 % Commonly known as: Lidoderm   metroNIDAZOLE 500 MG tablet Commonly known as: FLAGYL       TAKE these medications    aspirin 81 MG chewable tablet Chew 81 mg by mouth daily.   docusate sodium 100 MG capsule Commonly known as: COLACE Take 100 mg by mouth daily as needed for moderate constipation.   MULTI-VITAMIN DAILY PO Take 1 tablet by mouth daily.             09/20/2023 Hidaya Daniel GEFFEL Chestine Spore, MD

## 2023-11-08 ENCOUNTER — Encounter (HOSPITAL_COMMUNITY): Payer: Self-pay | Admitting: Obstetrics and Gynecology

## 2023-11-08 ENCOUNTER — Inpatient Hospital Stay (HOSPITAL_BASED_OUTPATIENT_CLINIC_OR_DEPARTMENT_OTHER): Payer: No Typology Code available for payment source

## 2023-11-08 ENCOUNTER — Observation Stay (HOSPITAL_COMMUNITY)
Admission: AD | Admit: 2023-11-08 | Discharge: 2023-11-09 | Disposition: A | Discharge status: Home / Self Care | Payer: No Typology Code available for payment source | Attending: Obstetrics | Admitting: Obstetrics

## 2023-11-08 DIAGNOSIS — O26613 Liver and biliary tract disorders in pregnancy, third trimester: Secondary | ICD-10-CM

## 2023-11-08 DIAGNOSIS — O26643 Intrahepatic cholestasis of pregnancy, third trimester: Secondary | ICD-10-CM | POA: Insufficient documentation

## 2023-11-08 DIAGNOSIS — K831 Obstruction of bile duct: Secondary | ICD-10-CM

## 2023-11-08 DIAGNOSIS — Z3A35 35 weeks gestation of pregnancy: Secondary | ICD-10-CM | POA: Insufficient documentation

## 2023-11-08 DIAGNOSIS — O322XX1 Maternal care for transverse and oblique lie, fetus 1: Secondary | ICD-10-CM | POA: Insufficient documentation

## 2023-11-08 DIAGNOSIS — O99213 Obesity complicating pregnancy, third trimester: Secondary | ICD-10-CM | POA: Insufficient documentation

## 2023-11-08 DIAGNOSIS — O288 Other abnormal findings on antenatal screening of mother: Secondary | ICD-10-CM

## 2023-11-08 DIAGNOSIS — O09513 Supervision of elderly primigravida, third trimester: Secondary | ICD-10-CM

## 2023-11-08 DIAGNOSIS — E669 Obesity, unspecified: Secondary | ICD-10-CM | POA: Insufficient documentation

## 2023-11-08 DIAGNOSIS — O26649 Intrahepatic cholestasis of pregnancy, unspecified trimester: Secondary | ICD-10-CM | POA: Insufficient documentation

## 2023-11-08 DIAGNOSIS — O368331 Maternal care for abnormalities of the fetal heart rate or rhythm, third trimester, fetus 1: Principal | ICD-10-CM | POA: Insufficient documentation

## 2023-11-08 LAB — OB RESULTS CONSOLE GBS: GBS: POSITIVE

## 2023-11-08 MED ORDER — SODIUM CHLORIDE 0.9% FLUSH
10.0000 mL | Freq: Two times a day (BID) | INTRAVENOUS | Status: DC
  Administered 2023-11-08 – 2023-11-09 (×2): 10 mL via INTRAVENOUS

## 2023-11-08 MED ORDER — ZOLPIDEM TARTRATE 5 MG PO TABS: 5.0000 mg | ORAL_TABLET | Freq: Every evening | ORAL | Status: DC | PRN

## 2023-11-08 MED ORDER — PRENATAL MULTIVITAMIN CH
1.0000 | ORAL_TABLET | Freq: Every day | ORAL | Status: DC
Start: 2023-11-09 — End: 2023-11-09

## 2023-11-08 MED ORDER — ACETAMINOPHEN 325 MG PO TABS: 650.0000 mg | ORAL_TABLET | ORAL | Status: DC | PRN

## 2023-11-08 MED ORDER — ASPIRIN 81 MG PO TBEC
81.0000 mg | DELAYED_RELEASE_TABLET | Freq: Every day | ORAL | Status: DC
  Administered 2023-11-09: 81 mg via ORAL
  Filled 2023-11-08: qty 1

## 2023-11-08 MED ORDER — DOCUSATE SODIUM 100 MG PO CAPS
100.0000 mg | ORAL_CAPSULE | Freq: Every day | ORAL | Status: DC
  Filled 2023-11-08: qty 1

## 2023-11-08 MED ORDER — CALCIUM CARBONATE ANTACID 500 MG PO CHEW: 2.0000 | CHEWABLE_TABLET | ORAL | Status: DC | PRN

## 2023-11-08 MED ORDER — URSODIOL 300 MG PO CAPS
300.0000 mg | ORAL_CAPSULE | Freq: Two times a day (BID) | ORAL | Status: DC
Start: 2023-11-08 — End: 2023-11-09
  Administered 2023-11-08 – 2023-11-09 (×2): 300 mg via ORAL
  Filled 2023-11-08 (×2): qty 1

## 2023-11-08 NOTE — H&P (Signed)
37 y.o. G1P0 @ [redacted]w[redacted]d presents to MAU for extended fetal monitoring / BPP.  She was seen in the office today for a scheduled NST for cholestasis of pregnancy.  NST was non-reactive.  Ultrasound was not available, so she was sent to MAU for BPP.  While awaiting BPP, NST was again non-reactive (1 acceleration in 40 minutes).  BPP was 6/10 (-2 breathing, -2 NST).  Otherwise has good fetal movement and no bleeding.  Pregnancy complicated by: Cholestasis of pregnancy: diagnosed at 22 weeks. On BID ursodiol.  History of gastric sleeve in 01/2022 Failed 1hr gtt.  Could not tolerate 3 hr gtt.  Checking BG QID Pre-pregnancy BMI 40.  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  Patient reports she has obtained letter from her neurosurgeon that no alteration in care needed due to surgical history.  Are awaiting copy Malpresentation of fetus: transverse lie.  Declines ECV.  Has elected for primary cesarean delivery Alpha thalassemia silent carrier diagnosed on carrier screen.  Unclear if FOB has been tested  Past Medical History:  Diagnosis Date   Anemia    low iron   Anxiety    Chronic constipation    Elevated BP without diagnosis of hypertension    GERD (gastroesophageal reflux disease)    Headache    Nasofrontal encephalocele (HCC)    hx/notes 10/09/2018   Nocturnal hypoxemia    Obesity    Palpitations    Pneumonia    Trichomonal vaginitis    Vaginal yeast infection     Past Surgical History:  Procedure Laterality Date   APPLICATION OF CRANIAL NAVIGATION N/A 07/07/2021   Procedure: APPLICATION OF CRANIAL NAVIGATION;  Surgeon: Jadene Pierini, MD;  [facility]: [facility];  Service: Neurosurgery;  Laterality: N/A;   COLONOSCOPY WITH PROPOFOL N/A 05/30/2018   Procedure: COLONOSCOPY WITH PROPOFOL;  Surgeon: Lynann Bologna, MD;  Location: [facility];  Service: Endoscopy;  Laterality: N/A;    Endoscopic endonasal repair of ethmoidal encephalocele  10/09/2018   ETHMOIDECTOMY N/A 10/09/2018   Procedure: ETHMOIDECTOMY;  Surgeon: [name], MD;  Location: [facility] OR;  Service: ENT;  Laterality: N/A;   LAPAROSCOPIC GASTRIC SLEEVE RESECTION  01/2022   LAPAROSCOPY Right 07/07/2021   Procedure: LAPAROSCOPY DIAGNOSTIC;  Surgeon: Quentin Ore, MD;  Location: [facility];  Service: General;  Laterality: Right;   NASAL SINUS SURGERY N/A 10/09/2018   Procedure: NASAL ENDOSCOPY ,REPAIR OF FRONTAL ENCEPHALOCELE,HARVEST OF SEPTO BONE/CARTLEDGE;  Surgeon: [name], MD;  Location: [facility] OR;  Service: ENT;  Laterality: N/A;   NASAL SINUS SURGERY N/A 10/31/2019   Procedure: ENDOSCOPIC REPAIR OF CEREBROSPINAL FLUID LEAK;  Surgeon: [name], MD;  Location: [facility] OR;  Service: ENT;  Laterality: N/A;  ENDOSCOPIC REPAIR OF CEREBROSPINAL FLUID LEAK   PLACEMENT OF LUMBAR DRAIN N/A 10/31/2019   Procedure: PLACEMENT OF LUMBAR DRAIN;  Surgeon: Jadene Pierini, MD;  [facility];  Service: Neurosurgery;  Laterality: N/A;   REPAIR OF CEREBROSPINAL FLUID LEAK N/A 10/09/2018   Procedure: Endoscopic endonasal repair of ethmoidal encephalocele;  Surgeon: Jadene Pierini, MD;  [facility];  Service: Neurosurgery;  Laterality: N/A;   REPAIR OF CEREBROSPINAL FLUID LEAK N/A 10/31/2019   Procedure: Endonasal repair of cerebrospinal fluid leak with placement of lumbar drain;  Surgeon: Jadene Pierini, MD;  [facility];  Service: Neurosurgery;  Laterality: N/A;  Endonasal repair of cerebrospinal fluid leak with placement  of lumbar drain   VENTRICULOPERITONEAL SHUNT Right 07/07/2021   Procedure: Right Ventriculoperitoneal shunt;  Surgeon: Jadene Pierini, MD;  Location: [facility];  Service: Neurosurgery;  Laterality: Right;  RM 20    OB History  Gravida Para Term Preterm AB Living  1            SAB IAB Ectopic Multiple Live Births               # Outcome Date GA Lbr Len/2nd Weight Sex Type Anes PTL  Lv  1 Current             Social History   Socioeconomic History   Marital status: Single    Spouse name: Not on file   Number of children: 0   Years of education: Not on file   Highest education level: Not on file  Occupational History   Occupation: [employer]  Tobacco Use   Smoking status: Never   Smokeless tobacco: Never  Vaping Use   Vaping status: Never Used  Substance and Sexual Activity   Alcohol use: Not Currently    Comment: 10/09/2018 "couple glasses of wine/month"   Drug use: Never   Sexual activity: Not Currently    Birth control/protection: Condom  Other Topics Concern   Not on file  Social History Narrative   Not on file   Social Determinants of Health   Financial Resource Strain: Not on file  Food Insecurity: Not on file  Transportation Needs: Not on file  Physical Activity: Not on file  Stress: Not on file  Social Connections: Not on file  Intimate Partner Violence: Not on file   Patient has no known allergies.    Prenatal Transfer Tool  Maternal Diabetes: see above, h/o gastric sleeve.  QID BG Genetic Screening: Normal Maternal Ultrasounds/Referrals: Normal Fetal Ultrasounds or other Referrals:  None Maternal Substance Abuse:  No Significant Maternal Medications:  Meds include: Other: ursodiol Significant Maternal Lab Results: Other:  GBS pending  ABO, Rh: --/--/B POS (10/01 0550) Antibody: NEG (10/01 0550) Rubella:  Immune RPR:   NR HBsAg:   Negative HIV:   Negative GBS:   pending     Vitals:   11/08/23 1717 11/08/23 1723  BP: 124/81 119/77  Pulse: (!) 107 97  Temp: 99.1 F (37.3 C)   SpO2: 100%      General:  NAD Abdomen:  soft, gravid Ex:  no edema FHTs:  145, moderate variability, non-reactive, no decelerations.  Non-continuous tracing Toco:  quiet   A/P   37 y.o. G1P0 [redacted]w[redacted]d presents with equivocal fetal testing Fetal testing: patient with reports of good FM.  Patient with multiple episodes of non-reactive  NST for routine antenatal monitoring.  To date, follow up BPP has always been 8/10.   BPP today 6/10 (-2 NST, -2 breathing).  Will admit for observation overnight with continuous monitoring and plan repeat BPP in the morning tomorrow Cholestasis of pregnancy: continue ursodiol 300 mg BID.   H/o gastric sleeve: failed 1hr gtt, unable to tolerate 3 hr gtt.  QID BG Transverse presentation: Declines attempt at ECV; desires primary cesarean section plan delivery by    [facility] GEFFEL [facility]

## 2023-11-08 NOTE — MAU Provider Note (Signed)
History     CSN: [id number]  Arrival date and time: 11/08/23 1657   None     Chief Complaint  Patient presents with   Failed NST   Patient is here from [facility] office for failed NST in office. Endorses eeling regular and vigorous fetal movement. Denies VB, LOF, pain or other complaints. Patient followed for cholestasis this pregnancy with regular NSTs. C/S scheduled for next week per patient.           OB History     Gravida  1   Para      Term      Preterm      AB      Living         SAB      IAB      Ectopic      Multiple      Live Births              Past Medical History:  Diagnosis Date   Anemia    low iron   Anxiety    Chronic constipation    Elevated BP without diagnosis of hypertension    GERD (gastroesophageal reflux disease)    Headache    Nasofrontal encephalocele (HCC)    hx/notes 10/09/2018   Nocturnal hypoxemia    Obesity    Palpitations    Pneumonia    Trichomonal vaginitis    Vaginal yeast infection     Past Surgical History:  Procedure Laterality Date   APPLICATION OF CRANIAL NAVIGATION N/A 07/07/2021   Procedure: APPLICATION OF CRANIAL NAVIGATION;  Surgeon: Jadene Pierini, MD;  [facility];  Service: Neurosurgery;  Laterality: N/A;   COLONOSCOPY WITH PROPOFOL N/A 05/30/2018   Procedure: COLONOSCOPY WITH PROPOFOL;  Surgeon: Lynann Bologna, MD;  [facility];  Service: Endoscopy;  Laterality: N/A;   Endoscopic endonasal repair of ethmoidal encephalocele  10/09/2018   ETHMOIDECTOMY N/A 10/09/2018   Procedure: ETHMOIDECTOMY;  Surgeon: [name], MD;  Location: [facility] OR;  Service: ENT;  Laterality: N/A;   LAPAROSCOPIC GASTRIC SLEEVE RESECTION  01/2022   LAPAROSCOPY Right 07/07/2021   Procedure: LAPAROSCOPY DIAGNOSTIC;  Surgeon: Quentin Ore, MD;  [facility];  Service: General;  Laterality: Right;   NASAL SINUS SURGERY N/A 10/09/2018   Procedure: NASAL ENDOSCOPY ,REPAIR OF FRONTAL  ENCEPHALOCELE,HARVEST OF SEPTO BONE/CARTLEDGE;  Surgeon: [name], MD;  Location: [facility] OR;  Service: ENT;  Laterality: N/A;   NASAL SINUS SURGERY N/A 10/31/2019   Procedure: ENDOSCOPIC REPAIR OF CEREBROSPINAL FLUID LEAK;  Surgeon: [name], MD;  Location: [facility] OR;  Service: ENT;  Laterality: N/A;  ENDOSCOPIC REPAIR OF CEREBROSPINAL FLUID LEAK   PLACEMENT OF LUMBAR DRAIN N/A 10/31/2019   Procedure: PLACEMENT OF LUMBAR DRAIN;  Surgeon: Jadene Pierini, MD;  [facility];  Service: Neurosurgery;  Laterality: N/A;   REPAIR OF CEREBROSPINAL FLUID LEAK N/A 10/09/2018   Procedure: Endoscopic endonasal repair of ethmoidal encephalocele;  Surgeon: Jadene Pierini, MD;  [facility];  Service: Neurosurgery;  Laterality: N/A;   REPAIR OF CEREBROSPINAL FLUID LEAK N/A 10/31/2019   Procedure: Endonasal repair of cerebrospinal fluid leak with placement of lumbar drain;  Surgeon: Jadene Pierini, MD;  [facility];  Service: Neurosurgery;  Laterality: N/A;  Endonasal repair of cerebrospinal fluid leak with placement of lumbar drain   VENTRICULOPERITONEAL SHUNT Right 07/07/2021   Procedure: Right Ventriculoperitoneal shunt;  Surgeon: Jadene Pierini, MD;  [facility];  Service: Neurosurgery;  Laterality: Right;  RM 20    Family History  Problem Relation Age of Onset   Healthy Mother     Social History   Tobacco Use   Smoking status: Never   Smokeless tobacco: Never  Vaping Use   Vaping status: Never Used  Substance Use Topics   Alcohol use: Not Currently    Comment: 10/09/2018 "couple glasses of wine/month"   Drug use: Never    Allergies: No Known Allergies  Medications Prior to Admission  Medication Sig Dispense Refill Last Dose   aspirin 81 MG chewable tablet Chew 81 mg by mouth daily.   11/08/2023   Multiple Vitamin (MULTI-VITAMIN DAILY PO) Take 1 tablet by mouth daily.   11/08/2023   ursodiol (ACTIGALL) 250 MG tablet Take 250 mg by mouth in the morning and at  bedtime.   11/08/2023   docusate sodium (COLACE) 100 MG capsule Take 100 mg by mouth daily as needed for moderate constipation.       Review of Systems  Constitutional:  Negative for chills, fatigue, fever and unexpected weight change.  Respiratory:  Negative for cough and shortness of breath.   Cardiovascular:  Negative for chest pain and palpitations.  Gastrointestinal:  Negative for abdominal pain, constipation, diarrhea, nausea and vomiting.  Genitourinary:  Negative for difficulty urinating, flank pain, frequency and urgency.   Physical Exam   Blood pressure 119/77, pulse 97, temperature 99.1 F (37.3 C), temperature source Oral, height 5\' 1"  (1.549 m), weight 103.9 kg, SpO2 100%.  Physical Exam Vitals reviewed.  Constitutional:      Appearance: Normal appearance.  HENT:     Head: Normocephalic.  Pulmonary:     Effort: Pulmonary effort is normal.  Skin:    General: Skin is warm and dry.     Capillary Refill: Capillary refill takes less than 2 seconds.  Neurological:     Mental Status: She is alert and oriented to person, place, and time.  Psychiatric:        Mood and Affect: Mood normal.        Behavior: Behavior normal.        Thought Content: Thought content normal.        Judgment: Judgment normal.     Fetal Assessment 145 bpm, minimal to moderate Var, -Decels, 10x10 Accels Toco: UI  MAU Course  No results found for this or any previous visit (from the past 24 hour(s)). No results found.  MDM PE Labs:  BPP and NST EFM  Assessment and Plan  37yo  G1 P0  SIUP at 35.5weeks Cat 2 FT   - Exam findings discussed. - Concern for 6/8 BPP and non-reassuring NST in the setting of cholestasis.  - Discussed admission with Dr. Chestine Spore, on- call physician with [facility]. Recommended admission.  - Care turned over to private provider.    Richardson Landry MSN, CNM 11/08/2023, 7:53 PM

## 2023-11-08 NOTE — MAU Note (Signed)
.  Anna Morrison is a 37 y.o. at [redacted]w[redacted]d here in MAU reporting: sent from [facility] office for failed NST. Patient reports +FM. Denies VB, LOF, pain, or any other complaints. Pregnancy complicated by cholestasis.   LMP: N/A Onset of complaint: Today Pain score: Denies pain Vitals:   11/08/23 1717  BP: 124/81  Pulse: (!) 107  Temp: 99.1 F (37.3 C)  SpO2: 100%     FHT:145 Lab orders placed from triage:  N/A

## 2023-11-09 ENCOUNTER — Observation Stay (HOSPITAL_BASED_OUTPATIENT_CLINIC_OR_DEPARTMENT_OTHER): Payer: No Typology Code available for payment source

## 2023-11-09 ENCOUNTER — Other Ambulatory Visit: Payer: Self-pay

## 2023-11-09 DIAGNOSIS — O09513 Supervision of elderly primigravida, third trimester: Secondary | ICD-10-CM | POA: Diagnosis not present

## 2023-11-09 DIAGNOSIS — O368331 Maternal care for abnormalities of the fetal heart rate or rhythm, third trimester, fetus 1: Secondary | ICD-10-CM | POA: Diagnosis not present

## 2023-11-09 DIAGNOSIS — O26613 Liver and biliary tract disorders in pregnancy, third trimester: Secondary | ICD-10-CM | POA: Diagnosis not present

## 2023-11-09 DIAGNOSIS — O99213 Obesity complicating pregnancy, third trimester: Secondary | ICD-10-CM | POA: Diagnosis not present

## 2023-11-09 DIAGNOSIS — E669 Obesity, unspecified: Secondary | ICD-10-CM

## 2023-11-09 DIAGNOSIS — Z3A35 35 weeks gestation of pregnancy: Secondary | ICD-10-CM

## 2023-11-09 DIAGNOSIS — O288 Other abnormal findings on antenatal screening of mother: Secondary | ICD-10-CM

## 2023-11-09 DIAGNOSIS — K831 Obstruction of bile duct: Secondary | ICD-10-CM

## 2023-11-09 LAB — CBC
HCT: 30.3 % — ABNORMAL LOW (ref 36.0–46.0)
Hemoglobin: 9.2 g/dL — ABNORMAL LOW (ref 12.0–15.0)
MCH: 24.5 pg — ABNORMAL LOW (ref 26.0–34.0)
MCHC: 30.4 g/dL (ref 30.0–36.0)
MCV: 80.8 fL (ref 80.0–100.0)
Platelets: 257 10*3/uL (ref 150–400)
RBC: 3.75 MIL/uL — ABNORMAL LOW (ref 3.87–5.11)
RDW: 13.3 % (ref 11.5–15.5)
WBC: 10.1 10*3/uL (ref 4.0–10.5)
nRBC: 0 % (ref 0.0–0.2)

## 2023-11-09 LAB — TYPE AND SCREEN
ABO/RH(D): B POS
Antibody Screen: NEGATIVE

## 2023-11-09 LAB — GLUCOSE, CAPILLARY: Glucose-Capillary: 73 mg/dL (ref 70–99)

## 2023-11-09 NOTE — Progress Notes (Signed)
Baby difficult to trace from 0741 until 0812 due to pt walking around the room. Pt taken off monitor at 0812 to go to ultrasound.

## 2023-11-09 NOTE — Discharge Instructions (Signed)
Call office with any concerns [phone]

## 2023-11-09 NOTE — Progress Notes (Signed)
Discharge instructions and prescriptions given to pt. Discussed signs and symptoms to report to the MD, upcoming appointments, and meds. Pt verbalizes understanding and has no questions or concerns at this time. Pt discharged home from hospital in stable condition. 

## 2023-11-09 NOTE — Discharge Summary (Signed)
Physician Discharge Summary  Patient ID: BEOLA VOLMAR MRN: [id number] DOB/AGE: Sep 07, 1986 37 y.o.  Admit date: 11/08/2023 Discharge date: 11/09/2023  Admission Diagnoses: Non reassuring fetal monitoring  Cholestasis of pregnancy  Maternal obesity complicating pregnancy : hx gastric sleeve Transverse presentation  Discharge Diagnoses: Same  Principal Problem:   Non-reactive NST (non-stress test) Active Problems:   Cholestasis of pregnancy, antepartum   Discharged Condition: stable  Hospital Course: Pt monitored overnight. BPP 8/8 this am with no concerning fetal heart rate tracings overnight. Pt deemed stable for discharge to home with close office follow up and planned c/s scheduled  Consults:  none  Significant Diagnostic Studies: Biophysical profile   Treatments: extended monitoring   Discharge Exam: Blood pressure 117/74, pulse (!) 104, temperature 98.3 F (36.8 C), temperature source Oral, resp. rate 18, height 5\' 1"  (1.549 m), weight 103.9 kg, SpO2 100%. General appearance: alert, cooperative, and no distress Pelvic: deferred  Extremities: no edema, redness or tenderness in the calves or thighs  Disposition: Discharge disposition: 01-Home or Self Care       Discharge Instructions     Diet Carb Modified   Complete by: As directed    Increase activity slowly   Complete by: As directed       Allergies as of 11/09/2023   No Known Allergies      Medication List     TAKE these medications    aspirin 81 MG chewable tablet Chew 81 mg by mouth daily.   docusate sodium 100 MG capsule Commonly known as: COLACE Take 100 mg by mouth daily as needed for moderate constipation.   MULTI-VITAMIN DAILY PO Take 1 tablet by mouth daily.   ursodiol 250 MG tablet Commonly known as: ACTIGALL Take 250 mg by mouth in the morning and at bedtime.        Follow-up Information     Ob/Gyn, Nestor Ramp. Go on 11/14/2023.   Contact information: [address] [place] [address] [phone]                 Signed: Cathrine Muster 11/09/2023, 10:51 AM

## 2023-11-09 NOTE — Progress Notes (Signed)
Patient ID: Anna Morrison, female   DOB: June 10, 1986, 37 y.o.   MRN: [id number] Pt reports no complaints overnight. No itching at this time. +Fms VSS: 119/74, 97, 90, 18 GEN - NAD ABD - soft, cw GA  EXT - +1 edema   BPP: 8/8 this am   A/P:  37 y.o. G1P0 [redacted]w[redacted]d  on HD#2 admitted for extended monitoring and repeat BPP FHR: No decels noted overnight, moderate variability, BPP 8/8 this am. Cholestasis of pregnancy: continue ursodiol 300 mg BID.   H/o gastric sleeve: failed 1hr gtt, unable to tolerate 3 hr gtt.  QID BG Transverse presentation: Declines attempt at ECV; desires primary cesarean section - scheduled for 11/21/23   Pt with appointment in office scheduled for 11/14/23.   Discharge to home with kick count instructions given

## 2023-11-11 ENCOUNTER — Encounter (HOSPITAL_COMMUNITY): Payer: Self-pay

## 2023-11-11 NOTE — Patient Instructions (Signed)
Anna Morrison  11/11/2023   Your procedure is scheduled on:  11/21/2023  Arrive at 1015 at [employer] [facility] on [address] at [facility]  and [employer]. You are invited to use the FREE valet parking or use the Visitor's parking deck.  Pick up the phone at the desk and dial [phone].  Call this number if you have problems the morning of surgery: [phone]  Remember:   Do not eat food:(After Midnight) Desps de medianoche.  You may drink clear liquids until arrival at ___1015__.  Clear liquids means a liquid you can see thru.  It can have color such as Cola or Kool aid.  Tea is OK and coffee as long as no milk or creamer of any kind.  Take these medicines the morning of surgery with A SIP OF WATER:  ursodiol   Do not wear jewelry, make-up or nail polish.  Do not wear lotions, powders, or perfumes. Do not wear deodorant.  Do not shave 48 hours prior to surgery.  Do not bring valuables to the hospital.  [facility] is not   responsible for any belongings or valuables brought to the hospital.  Contacts, dentures or bridgework may not be worn into surgery.  Leave suitcase in the car. After surgery it may be brought to your room.  For patients admitted to the hospital, checkout time is 11:00 AM the day of              discharge.      Please read over the following fact sheets that you were given:     Preparing for Surgery

## 2023-11-18 ENCOUNTER — Encounter (HOSPITAL_COMMUNITY)
Admission: RE | Admit: 2023-11-18 | Discharge: 2023-11-18 | Disposition: A | Discharge status: Home / Self Care | Payer: No Typology Code available for payment source | Source: Ambulatory Visit | Attending: Family Medicine | Admitting: Family Medicine

## 2023-11-18 VITALS — Ht 61.0 in | Wt 236.0 lb

## 2023-11-18 DIAGNOSIS — Z01812 Encounter for preprocedural laboratory examination: Secondary | ICD-10-CM | POA: Insufficient documentation

## 2023-11-18 DIAGNOSIS — Z98891 History of uterine scar from previous surgery: Secondary | ICD-10-CM | POA: Insufficient documentation

## 2023-11-18 LAB — CBC
HCT: 32.8 % — ABNORMAL LOW (ref 36.0–46.0)
Hemoglobin: 9.8 g/dL — ABNORMAL LOW (ref 12.0–15.0)
MCH: 24.1 pg — ABNORMAL LOW (ref 26.0–34.0)
MCHC: 29.9 g/dL — ABNORMAL LOW (ref 30.0–36.0)
MCV: 80.8 fL (ref 80.0–100.0)
Platelets: 288 10*3/uL (ref 150–400)
RBC: 4.06 MIL/uL (ref 3.87–5.11)
RDW: 13.4 % (ref 11.5–15.5)
WBC: 7.9 10*3/uL (ref 4.0–10.5)
nRBC: 0 % (ref 0.0–0.2)

## 2023-11-18 LAB — TYPE AND SCREEN
ABO/RH(D): B POS
Antibody Screen: NEGATIVE

## 2023-11-18 LAB — RPR: RPR Ser Ql: NONREACTIVE

## 2023-11-20 ENCOUNTER — Encounter (HOSPITAL_COMMUNITY): Payer: Self-pay | Admitting: Obstetrics and Gynecology

## 2023-11-21 ENCOUNTER — Inpatient Hospital Stay (HOSPITAL_COMMUNITY): Payer: No Typology Code available for payment source | Admitting: Anesthesiology

## 2023-11-21 ENCOUNTER — Encounter (HOSPITAL_COMMUNITY): Payer: Self-pay | Admitting: Obstetrics and Gynecology

## 2023-11-21 ENCOUNTER — Encounter (HOSPITAL_COMMUNITY)
Admission: AD | Disposition: A | Discharge status: Home / Self Care | Payer: Self-pay | Source: Home / Self Care | Attending: Obstetrics

## 2023-11-21 ENCOUNTER — Inpatient Hospital Stay (HOSPITAL_COMMUNITY)
Admission: RE | Admit: 2023-11-21 | Discharge: 2023-11-24 | DRG: 788 | Disposition: A | Discharge status: Home / Self Care | Payer: No Typology Code available for payment source | Attending: Obstetrics and Gynecology | Admitting: Obstetrics and Gynecology

## 2023-11-21 ENCOUNTER — Other Ambulatory Visit: Payer: Self-pay

## 2023-11-21 ENCOUNTER — Encounter (HOSPITAL_COMMUNITY)
Admission: RE | Disposition: A | Discharge status: Home / Self Care | Payer: Self-pay | Source: Home / Self Care | Attending: Obstetrics and Gynecology

## 2023-11-21 DIAGNOSIS — O26643 Intrahepatic cholestasis of pregnancy, third trimester: Secondary | ICD-10-CM | POA: Diagnosis present

## 2023-11-21 DIAGNOSIS — O26649 Intrahepatic cholestasis of pregnancy, unspecified trimester: Principal | ICD-10-CM

## 2023-11-21 DIAGNOSIS — O99844 Bariatric surgery status complicating childbirth: Secondary | ICD-10-CM | POA: Diagnosis present

## 2023-11-21 DIAGNOSIS — K219 Gastro-esophageal reflux disease without esophagitis: Secondary | ICD-10-CM | POA: Diagnosis present

## 2023-11-21 DIAGNOSIS — E66813 Obesity, class 3: Secondary | ICD-10-CM | POA: Diagnosis present

## 2023-11-21 DIAGNOSIS — Z833 Family history of diabetes mellitus: Secondary | ICD-10-CM | POA: Diagnosis not present

## 2023-11-21 DIAGNOSIS — O9902 Anemia complicating childbirth: Secondary | ICD-10-CM | POA: Diagnosis present

## 2023-11-21 DIAGNOSIS — K7689 Other specified diseases of liver: Secondary | ICD-10-CM | POA: Diagnosis present

## 2023-11-21 DIAGNOSIS — O24420 Gestational diabetes mellitus in childbirth, diet controlled: Secondary | ICD-10-CM | POA: Diagnosis present

## 2023-11-21 DIAGNOSIS — O9962 Diseases of the digestive system complicating childbirth: Secondary | ICD-10-CM | POA: Diagnosis present

## 2023-11-21 DIAGNOSIS — O99214 Obesity complicating childbirth: Secondary | ICD-10-CM | POA: Diagnosis present

## 2023-11-21 DIAGNOSIS — Z3493 Encounter for supervision of normal pregnancy, unspecified, third trimester: Secondary | ICD-10-CM

## 2023-11-21 DIAGNOSIS — O99824 Streptococcus B carrier state complicating childbirth: Secondary | ICD-10-CM | POA: Diagnosis present

## 2023-11-21 DIAGNOSIS — Z87728 Personal history of other specified (corrected) congenital malformations of nervous system and sense organs: Secondary | ICD-10-CM

## 2023-11-21 DIAGNOSIS — Z8249 Family history of ischemic heart disease and other diseases of the circulatory system: Secondary | ICD-10-CM | POA: Diagnosis not present

## 2023-11-21 DIAGNOSIS — Z3A37 37 weeks gestation of pregnancy: Secondary | ICD-10-CM

## 2023-11-21 DIAGNOSIS — E7879 Other disorders of bile acid and cholesterol metabolism: Secondary | ICD-10-CM | POA: Diagnosis present

## 2023-11-21 LAB — CBC
HCT: 32 % — ABNORMAL LOW (ref 36.0–46.0)
Hemoglobin: 9.5 g/dL — ABNORMAL LOW (ref 12.0–15.0)
MCH: 24 pg — ABNORMAL LOW (ref 26.0–34.0)
MCHC: 29.7 g/dL — ABNORMAL LOW (ref 30.0–36.0)
MCV: 80.8 fL (ref 80.0–100.0)
Platelets: 277 10*3/uL (ref 150–400)
RBC: 3.96 MIL/uL (ref 3.87–5.11)
RDW: 13.6 % (ref 11.5–15.5)
WBC: 9.3 10*3/uL (ref 4.0–10.5)
nRBC: 0.2 % (ref 0.0–0.2)

## 2023-11-21 LAB — GLUCOSE, CAPILLARY
Glucose-Capillary: 69 mg/dL — ABNORMAL LOW (ref 70–99)
Glucose-Capillary: 75 mg/dL (ref 70–99)
Glucose-Capillary: 68 mg/dL — ABNORMAL LOW (ref 70–99)
Glucose-Capillary: 88 mg/dL (ref 70–99)
Glucose-Capillary: 67 mg/dL — ABNORMAL LOW (ref 70–99)

## 2023-11-21 LAB — HIV ANTIBODY (ROUTINE TESTING W REFLEX): HIV Screen 4th Generation wRfx: NONREACTIVE

## 2023-11-21 LAB — TYPE AND SCREEN
ABO/RH(D): B POS
Antibody Screen: NEGATIVE

## 2023-11-21 SURGERY — Surgical Case
Anesthesia: Spinal

## 2023-11-21 SURGERY — Surgical Case
Anesthesia: Choice

## 2023-11-21 MED ORDER — ONDANSETRON HCL 4 MG/2ML IJ SOLN: 4.0000 mg | Freq: Four times a day (QID) | INTRAMUSCULAR | Status: DC | PRN

## 2023-11-21 MED ORDER — LACTATED RINGERS IV SOLN
500.0000 mL | INTRAVENOUS | Status: DC | PRN
  Administered 2023-11-22 (×2): 500 mL via INTRAVENOUS

## 2023-11-21 MED ORDER — TERBUTALINE SULFATE 1 MG/ML IJ SOLN
0.2500 mg | Freq: Once | INTRAMUSCULAR | Status: AC | PRN
  Administered 2023-11-21: 0.25 mg via SUBCUTANEOUS
  Filled 2023-11-21: qty 1

## 2023-11-21 MED ORDER — DIPHENHYDRAMINE HCL 50 MG/ML IJ SOLN: 12.5000 mg | INTRAMUSCULAR | Status: DC | PRN

## 2023-11-21 MED ORDER — TERBUTALINE SULFATE 1 MG/ML IJ SOLN: 0.2500 mg | Freq: Once | INTRAMUSCULAR | Status: DC | PRN

## 2023-11-21 MED ORDER — ONDANSETRON HCL 4 MG/2ML IJ SOLN
INTRAMUSCULAR | Status: AC
  Filled 2023-11-21: qty 2

## 2023-11-21 MED ORDER — OXYTOCIN-SODIUM CHLORIDE 30-0.9 UT/500ML-% IV SOLN: 1.0000 m[IU]/min | INTRAVENOUS | Status: DC

## 2023-11-21 MED ORDER — FENTANYL-BUPIVACAINE-NACL 0.5-0.125-0.9 MG/250ML-% EP SOLN: 12.0000 mL/h | EPIDURAL | Status: DC | PRN

## 2023-11-21 MED ORDER — FENTANYL CITRATE (PF) 100 MCG/2ML IJ SOLN
INTRAMUSCULAR | Status: AC
  Filled 2023-11-21: qty 2

## 2023-11-21 MED ORDER — MORPHINE SULFATE (PF) 0.5 MG/ML IJ SOLN
INTRAMUSCULAR | Status: AC
  Filled 2023-11-21: qty 10

## 2023-11-21 MED ORDER — PHENYLEPHRINE 80 MCG/ML (10ML) SYRINGE FOR IV PUSH (FOR BLOOD PRESSURE SUPPORT): 80.0000 ug | PREFILLED_SYRINGE | INTRAVENOUS | Status: DC | PRN

## 2023-11-21 MED ORDER — PENICILLIN G POT IN DEXTROSE 60000 UNIT/ML IV SOLN
3.0000 10*6.[IU] | INTRAVENOUS | Status: DC
  Administered 2023-11-21 – 2023-11-22 (×3): 3 10*6.[IU] via INTRAVENOUS
  Filled 2023-11-21 (×3): qty 50

## 2023-11-21 MED ORDER — OXYTOCIN-SODIUM CHLORIDE 30-0.9 UT/500ML-% IV SOLN
1.0000 m[IU]/min | INTRAVENOUS | Status: DC
  Administered 2023-11-22: 2 m[IU]/min via INTRAVENOUS
  Filled 2023-11-21: qty 500

## 2023-11-21 MED ORDER — CEFAZOLIN SODIUM-DEXTROSE 2-4 GM/100ML-% IV SOLN: 2.0000 g | INTRAVENOUS | Status: DC

## 2023-11-21 MED ORDER — CEFAZOLIN IN SODIUM CHLORIDE 3-0.9 GM/100ML-% IV SOLN
INTRAVENOUS | Status: AC
  Filled 2023-11-21: qty 100

## 2023-11-21 MED ORDER — MISOPROSTOL 50MCG HALF TABLET
50.0000 ug | ORAL_TABLET | ORAL | Status: DC | PRN
  Administered 2023-11-21: 50 ug via VAGINAL
  Filled 2023-11-21: qty 1

## 2023-11-21 MED ORDER — SOD CITRATE-CITRIC ACID 500-334 MG/5ML PO SOLN: 30.0000 mL | ORAL | Status: DC

## 2023-11-21 MED ORDER — SOD CITRATE-CITRIC ACID 500-334 MG/5ML PO SOLN
30.0000 mL | ORAL | Status: DC | PRN
  Administered 2023-11-22: 30 mL via ORAL
  Filled 2023-11-21: qty 30

## 2023-11-21 MED ORDER — ACETAMINOPHEN 325 MG PO TABS: 650.0000 mg | ORAL_TABLET | ORAL | Status: DC | PRN

## 2023-11-21 MED ORDER — URSODIOL 300 MG PO CAPS
300.0000 mg | ORAL_CAPSULE | Freq: Two times a day (BID) | ORAL | Status: DC
Start: 2023-11-21 — End: 2023-11-22
  Administered 2023-11-21: 300 mg via ORAL
  Filled 2023-11-21 (×2): qty 1

## 2023-11-21 MED ORDER — LIDOCAINE HCL (PF) 1 % IJ SOLN: 30.0000 mL | INTRAMUSCULAR | Status: DC | PRN

## 2023-11-21 MED ORDER — OXYTOCIN BOLUS FROM INFUSION: 333.0000 mL | Freq: Once | INTRAVENOUS | Status: DC

## 2023-11-21 MED ORDER — EPHEDRINE 5 MG/ML INJ
10.0000 mg | INTRAVENOUS | Status: DC | PRN
Start: 2023-11-21 — End: 2023-11-22

## 2023-11-21 MED ORDER — MISOPROSTOL 25 MCG QUARTER TABLET
25.0000 ug | ORAL_TABLET | Freq: Once | ORAL | Status: AC
  Administered 2023-11-21: 25 ug via VAGINAL
  Filled 2023-11-21: qty 1

## 2023-11-21 MED ORDER — LACTATED RINGERS IV SOLN: INTRAVENOUS | Status: DC

## 2023-11-21 MED ORDER — OXYCODONE-ACETAMINOPHEN 5-325 MG PO TABS: 1.0000 | ORAL_TABLET | ORAL | Status: DC | PRN

## 2023-11-21 MED ORDER — EPHEDRINE 5 MG/ML INJ: 10.0000 mg | INTRAVENOUS | Status: DC | PRN

## 2023-11-21 MED ORDER — FENTANYL CITRATE (PF) 100 MCG/2ML IJ SOLN
50.0000 ug | INTRAMUSCULAR | Status: DC | PRN
  Administered 2023-11-21: 50 ug via INTRAVENOUS
  Administered 2023-11-22: 100 ug via INTRAVENOUS
  Filled 2023-11-21 (×2): qty 2

## 2023-11-21 MED ORDER — SODIUM CHLORIDE 0.9 % IV SOLN
5.0000 10*6.[IU] | Freq: Once | INTRAVENOUS | Status: AC
  Administered 2023-11-21: 5 10*6.[IU] via INTRAVENOUS
  Filled 2023-11-21: qty 5

## 2023-11-21 MED ORDER — LACTATED RINGERS IV SOLN: 500.0000 mL | Freq: Once | INTRAVENOUS | Status: DC

## 2023-11-21 MED ORDER — MISOPROSTOL 25 MCG QUARTER TABLET
25.0000 ug | ORAL_TABLET | ORAL | Status: DC
  Filled 2023-11-21: qty 1

## 2023-11-21 MED ORDER — OXYTOCIN-SODIUM CHLORIDE 30-0.9 UT/500ML-% IV SOLN: 2.5000 [IU]/h | INTRAVENOUS | Status: DC

## 2023-11-21 MED ORDER — OXYCODONE-ACETAMINOPHEN 5-325 MG PO TABS: 2.0000 | ORAL_TABLET | ORAL | Status: DC | PRN

## 2023-11-21 MED ORDER — SOD CITRATE-CITRIC ACID 500-334 MG/5ML PO SOLN
ORAL | Status: AC
  Filled 2023-11-21: qty 30

## 2023-11-21 NOTE — Progress Notes (Signed)
Not feeling ctx Afeb, VSS FHT-improved, 140s, min-mod variability, currently no decels so Cat 1, irreg ctx VE-cl/30/-3, vtx, 25 mcg cytotec placed vaginally  Will continue with cytotec q 4 hrs, but 25 mcg instead of 50, monitor FHT closely

## 2023-11-21 NOTE — Progress Notes (Signed)
FHT currently 140-150, min-mod variability, occasional small variable decel, has occasional possible late decel and had 2 deep variables after returning from bathroom, Cat 2, ctx not tracing well Will continue close observation and cytotec q 4 hrs

## 2023-11-21 NOTE — Progress Notes (Signed)
CBG 67, patient give 4oz apple juice & mango icee.  Will recheck.   Recheck at 2151, CBG 91.

## 2023-11-21 NOTE — Progress Notes (Signed)
At 1429 she had a 7 minute prolonged decel to 70s, spontaneous recovery.  RN did VE and attempted to swipe out cytotec, also given terbutaline.  FHT baseline prior to prolonged decel was 140s, now 150-160, possible early decels. Baby vertex by u/s.  I reviewed with patient.  I will plan on VE at around 1700 to decide if will try intracervical foley or pitocin or both.  Discussed that baby might not tolerate labor and she may end up needing a c-section.

## 2023-11-21 NOTE — Progress Notes (Signed)
Pt given 4 oz apple juice. Will recheck CBG in 15 mins due to CBG of 69.

## 2023-11-21 NOTE — H&P (Addendum)
ISLAROSE LOCKHEART is a 37 y.o.G1P0 female at [redacted]w[redacted]d presenting for scheduled primary C-section for transverse fetal lie in the setting of intrahepatic cholestasis of pregnancy (ICP). Her fetus was found to be in vertex presentation by limited bedside ultrasound. Her ICP was diagnosed at [redacted]w[redacted]d. She underwent antenatal testing. Most recent LFTs and bile acid wnl.  She reports that she is feeling well today. +FM, denies VB, LOF, ctx.   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 -impaired glucose tolerance: failed 1hr GTT, could not tolerate 3hr GTT. 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 -BMI 40: was on ASA during pregnancy -anemia: Hgb 9.8 on 11/29   OB History     Gravida  1   Para      Term      Preterm      AB      Living         SAB      IAB      Ectopic      Multiple      Live Births             Past Medical History:  Diagnosis Date   Anemia    low iron   Anxiety    Chronic constipation    Elevated BP without diagnosis of hypertension    GERD (gastroesophageal reflux disease)    Headache    Nasofrontal encephalocele (HCC)    hx/notes 10/09/2018   Nocturnal hypoxemia    Obesity    Palpitations    Pneumonia    Trichomonal vaginitis    Vaginal yeast infection    Past Surgical History:  Procedure Laterality Date   APPLICATION OF CRANIAL NAVIGATION N/A 07/07/2021   Procedure: APPLICATION OF CRANIAL NAVIGATION;  Surgeon: Jadene Pierini, MD;  Location: [facility] OR;  Service: Neurosurgery;  Laterality: N/A;   COLONOSCOPY WITH PROPOFOL N/A 05/30/2018   Procedure: COLONOSCOPY WITH PROPOFOL;  Surgeon: Lynann Bologna, MD;  Location: [facility];  Service: Endoscopy;  Laterality: N/A;   Endoscopic endonasal repair of ethmoidal encephalocele  10/09/2018   ETHMOIDECTOMY N/A 10/09/2018   Procedure: ETHMOIDECTOMY;  Surgeon: [name], MD;  Location: [facility] OR;  Service: ENT;  Laterality: N/A;   LAPAROSCOPIC GASTRIC SLEEVE RESECTION  01/2022   LAPAROSCOPY Right 07/07/2021   Procedure: LAPAROSCOPY DIAGNOSTIC;  Surgeon: Quentin Ore, MD;  Location: [facility] OR;  Service: General;  Laterality: Right;   NASAL SINUS SURGERY N/A 10/09/2018   Procedure: NASAL ENDOSCOPY ,REPAIR OF FRONTAL ENCEPHALOCELE,HARVEST OF SEPTO BONE/CARTLEDGE;  Surgeon: [name], MD;  Location: [facility] OR;  Service: ENT;  Laterality: N/A;   NASAL SINUS SURGERY N/A 10/31/2019   Procedure: ENDOSCOPIC REPAIR OF CEREBROSPINAL FLUID LEAK;  Surgeon: [name], MD;  Location: [facility] OR;  Service: ENT;  Laterality: N/A;  ENDOSCOPIC REPAIR OF CEREBROSPINAL FLUID LEAK   PLACEMENT OF LUMBAR DRAIN N/A 10/31/2019   Procedure: PLACEMENT OF LUMBAR DRAIN;  Surgeon: Jadene Pierini, MD;  Location: [facility] OR;  Service: Neurosurgery;  Laterality: N/A;   REPAIR OF CEREBROSPINAL FLUID LEAK N/A 10/09/2018   Procedure: Endoscopic endonasal repair of ethmoidal encephalocele;  Surgeon: Jadene Pierini, MD;  Location: [facility] OR;  Service: Neurosurgery;  Laterality: N/A;   REPAIR OF CEREBROSPINAL FLUID LEAK N/A 10/31/2019   Procedure: Endonasal repair of cerebrospinal fluid leak with placement of lumbar drain;  Surgeon: Jadene Pierini, MD;  Location: [facility] OR;  Service: Neurosurgery;  Laterality: N/A;  Endonasal repair of cerebrospinal fluid leak with placement of lumbar drain   VENTRICULOPERITONEAL SHUNT Right 07/07/2021   Procedure: Right Ventriculoperitoneal shunt;  Surgeon: Jadene Pierini, MD;  Location: [facility] OR;  Service: Neurosurgery;  Laterality: Right;  RM 20   Family History: family history includes Diabetes in her maternal grandmother and paternal grandmother; Healthy in her mother; Hypertension in her maternal grandmother and paternal grandmother. Social History:  reports that she has never smoked. She has never used smokeless tobacco. She reports that she does not  currently use alcohol. She reports that she does not use drugs.     Maternal Diabetes: undetermined, likely gDMA1 Genetic Screening: Normal Maternal Ultrasounds/Referrals: Normal Fetal Ultrasounds or other Referrals:  None Maternal Substance Abuse:  No Significant Maternal Medications:  None Significant Maternal Lab Results:  Group B Strep positive Number of Prenatal Visits:greater than 3 verified prenatal visits Maternal Vaccinations: none, declined Other Comments:  None  Review of Systems  All other systems reviewed and are negative.  Maternal Medical History:  Fetal activity: Perceived fetal activity is normal.   Prenatal complications: No bleeding, cholelithiasis, HIV, PIH, infection, IUGR, nephrolithiasis, oligohydramnios, placental abnormality, polyhydramnios, pre-eclampsia, preterm labor, substance abuse, thrombocytopenia or thrombophilia.   Prenatal Complications - Diabetes: gestational. Diabetes is managed by diet.     Exam by:: Dr. Clint Lipps (By ultrasound) Blood pressure 118/72, pulse (!) 108, temperature 98.9 F (37.2 C), temperature source Axillary, resp. rate 18, height 5\' 1"  (1.549 m), weight 110.4 kg. Maternal Exam:  Abdomen: Patient reports no abdominal tenderness. Fetal presentation: vertex Pelvis: adequate for delivery.   Cervix: Cervix evaluated by digital exam.     Fetal Exam Fetal Monitor Review: Mode: ultrasound.   Baseline rate: 135.    Physical Exam Vitals reviewed.  HENT:     Head: Normocephalic.  Eyes:     Extraocular Movements: Extraocular movements intact.  Cardiovascular:     Rate and Rhythm: Tachycardia present.     Comments: Well perfused Pulmonary:     Effort: Pulmonary effort is normal.  Abdominal:     Comments: gravid  Genitourinary:    Comments: SVE 0/50/-4 Musculoskeletal:        General: Normal range of motion.     Cervical back: Normal range of motion.  Skin:    General: Skin is warm and dry.  Neurological:      General: No focal deficit present.     Mental Status: She is alert and oriented to person, place, and time.  Psychiatric:        Mood and Affect: Mood normal.        Behavior: Behavior normal.        Thought Content: Thought content normal.        Judgment: Judgment normal.     Prenatal labs: ABO, Rh: --/--/B POS (11/29 1129) Antibody: NEG (11/29 1129) Rubella: Immune (06/06 0000) RPR: NON REACTIVE (11/29 1122)  HBsAg: Negative (06/06 0000)  HIV: Non-reactive (06/06 0000)  GBS: Positive/-- (11/19 0000)   Assessment/Plan: Rodney Booze is a 37 y.o.G1P0 female at [redacted]w[redacted]d presenting for scheduled primary C-section for transverse fetal lie in the setting of intrahepatic cholestasis of pregnancy (ICP). Her fetus was found to be in vertex presentation by limited bedside ultrasound. After discussion, she agrees to undergo an IOL. -IOL: Admit SVE 0/50/-4. Cytotec placed at 13:05. -ICP: diagnosed at [redacted]w[redacted]d. She underwent antenatal testing. Most recent LFTs and bile acid wnl. Ursidiol while still pregnant. 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 -impaired glucose tolerance: failed 1hr GTT, could not tolerate 3hr GTT. 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 Plan q2hr CBGs. -BMI 40: was on ASA during pregnancy -anemia: Hgb 9.8 on 11/29   Dispo: Anticipate SVD.  Markeeta Scalf A Shawnie Nicole 11/21/2023, 12:44 PM

## 2023-11-22 ENCOUNTER — Inpatient Hospital Stay (HOSPITAL_COMMUNITY): Payer: No Typology Code available for payment source | Admitting: Anesthesiology

## 2023-11-22 ENCOUNTER — Encounter (HOSPITAL_COMMUNITY): Payer: Self-pay | Admitting: Obstetrics and Gynecology

## 2023-11-22 ENCOUNTER — Encounter (HOSPITAL_COMMUNITY)
Admission: RE | Disposition: A | Discharge status: Home / Self Care | Payer: Self-pay | Source: Home / Self Care | Attending: Obstetrics and Gynecology

## 2023-11-22 DIAGNOSIS — Z3A37 37 weeks gestation of pregnancy: Secondary | ICD-10-CM

## 2023-11-22 LAB — GLUCOSE, CAPILLARY
Glucose-Capillary: 91 mg/dL (ref 70–99)
Glucose-Capillary: 93 mg/dL (ref 70–99)
Glucose-Capillary: 78 mg/dL (ref 70–99)

## 2023-11-22 LAB — RPR: RPR Ser Ql: NONREACTIVE

## 2023-11-22 SURGERY — Surgical Case
Anesthesia: Spinal

## 2023-11-22 MED ORDER — IBUPROFEN 600 MG PO TABS: 600.0000 mg | ORAL_TABLET | Freq: Four times a day (QID) | ORAL | Status: DC

## 2023-11-22 MED ORDER — SODIUM CHLORIDE 0.9 % IV SOLN
INTRAVENOUS | Status: DC | PRN
  Administered 2023-11-22: 500 mg via INTRAVENOUS

## 2023-11-22 MED ORDER — TETANUS-DIPHTH-ACELL PERTUSSIS 5-2.5-18.5 LF-MCG/0.5 IM SUSY: 0.5000 mL | PREFILLED_SYRINGE | Freq: Once | INTRAMUSCULAR | Status: DC

## 2023-11-22 MED ORDER — COCONUT OIL OIL: 1.0000 | TOPICAL_OIL | Status: DC | PRN

## 2023-11-22 MED ORDER — FENTANYL CITRATE (PF) 100 MCG/2ML IJ SOLN
INTRAMUSCULAR | Status: AC
  Filled 2023-11-22: qty 2

## 2023-11-22 MED ORDER — PHENYLEPHRINE HCL-NACL 20-0.9 MG/250ML-% IV SOLN
INTRAVENOUS | Status: AC
  Filled 2023-11-22: qty 250

## 2023-11-22 MED ORDER — SIMETHICONE 80 MG PO CHEW: 80.0000 mg | CHEWABLE_TABLET | ORAL | Status: DC | PRN

## 2023-11-22 MED ORDER — ONDANSETRON HCL 4 MG/2ML IJ SOLN
INTRAMUSCULAR | Status: AC
Start: 2023-11-22 — End: ?
  Filled 2023-11-22: qty 2

## 2023-11-22 MED ORDER — FENTANYL CITRATE (PF) 100 MCG/2ML IJ SOLN
INTRAMUSCULAR | Status: DC | PRN
  Administered 2023-11-22: 15 ug via INTRATHECAL

## 2023-11-22 MED ORDER — PHENYLEPHRINE HCL-NACL 20-0.9 MG/250ML-% IV SOLN
INTRAVENOUS | Status: DC | PRN
  Administered 2023-11-22: 60 ug/min via INTRAVENOUS

## 2023-11-22 MED ORDER — METHYLERGONOVINE MALEATE 0.2 MG/ML IJ SOLN: 0.2000 mg | INTRAMUSCULAR | Status: DC | PRN

## 2023-11-22 MED ORDER — FENTANYL CITRATE (PF) 100 MCG/2ML IJ SOLN
25.0000 ug | INTRAMUSCULAR | Status: DC | PRN
  Administered 2023-11-22: 50 ug via INTRAVENOUS

## 2023-11-22 MED ORDER — MAGNESIUM HYDROXIDE 400 MG/5ML PO SUSP: 30.0000 mL | ORAL | Status: DC | PRN

## 2023-11-22 MED ORDER — OXYTOCIN-SODIUM CHLORIDE 30-0.9 UT/500ML-% IV SOLN: 2.5000 [IU]/h | INTRAVENOUS | Status: AC

## 2023-11-22 MED ORDER — ENOXAPARIN SODIUM 60 MG/0.6ML IJ SOSY: 50.0000 mg | PREFILLED_SYRINGE | INTRAMUSCULAR | Status: DC

## 2023-11-22 MED ORDER — SODIUM CHLORIDE 0.9 % IR SOLN
Status: DC | PRN
  Administered 2023-11-22 (×2): 1

## 2023-11-22 MED ORDER — DEXAMETHASONE SODIUM PHOSPHATE 10 MG/ML IJ SOLN
INTRAMUSCULAR | Status: DC | PRN
  Administered 2023-11-22: 10 mg via INTRAVENOUS

## 2023-11-22 MED ORDER — OXYCODONE HCL 5 MG PO TABS
5.0000 mg | ORAL_TABLET | ORAL | Status: DC | PRN
  Administered 2023-11-23: 10 mg via ORAL
  Administered 2023-11-23 – 2023-11-24 (×3): 5 mg via ORAL
  Filled 2023-11-22: qty 2
  Filled 2023-11-22 (×3): qty 1

## 2023-11-22 MED ORDER — MEASLES, MUMPS & RUBELLA VAC IJ SOLR: 0.5000 mL | Freq: Once | INTRAMUSCULAR | Status: DC

## 2023-11-22 MED ORDER — DIPHENHYDRAMINE HCL 25 MG PO CAPS: 25.0000 mg | ORAL_CAPSULE | Freq: Four times a day (QID) | ORAL | Status: DC | PRN

## 2023-11-22 MED ORDER — KETOROLAC TROMETHAMINE 30 MG/ML IJ SOLN
30.0000 mg | Freq: Four times a day (QID) | INTRAMUSCULAR | Status: AC
  Administered 2023-11-22 (×3): 30 mg via INTRAVENOUS
  Filled 2023-11-22 (×4): qty 1

## 2023-11-22 MED ORDER — LACTATED RINGERS IV SOLN: INTRAVENOUS | Status: DC

## 2023-11-22 MED ORDER — PRENATAL MULTIVITAMIN CH
1.0000 | ORAL_TABLET | Freq: Every day | ORAL | Status: DC
  Administered 2023-11-22 – 2023-11-24 (×3): 1 via ORAL
  Filled 2023-11-22 (×3): qty 1

## 2023-11-22 MED ORDER — METHYLERGONOVINE MALEATE 0.2 MG PO TABS
0.2000 mg | ORAL_TABLET | ORAL | Status: DC | PRN
Start: 2023-11-22 — End: 2023-11-24

## 2023-11-22 MED ORDER — ONDANSETRON HCL 4 MG/2ML IJ SOLN
INTRAMUSCULAR | Status: DC | PRN
  Administered 2023-11-22: 4 mg via INTRAVENOUS

## 2023-11-22 MED ORDER — OXYTOCIN-SODIUM CHLORIDE 30-0.9 UT/500ML-% IV SOLN
INTRAVENOUS | Status: DC | PRN
  Administered 2023-11-22: 30 [IU] via INTRAVENOUS

## 2023-11-22 MED ORDER — MENTHOL 3 MG MT LOZG: 1.0000 | LOZENGE | OROMUCOSAL | Status: DC | PRN

## 2023-11-22 MED ORDER — MORPHINE SULFATE (PF) 0.5 MG/ML IJ SOLN
INTRAMUSCULAR | Status: DC | PRN
  Administered 2023-11-22: 150 ug via INTRATHECAL

## 2023-11-22 MED ORDER — ENOXAPARIN SODIUM 60 MG/0.6ML IJ SOSY
0.5000 mg/kg | PREFILLED_SYRINGE | INTRAMUSCULAR | Status: DC
  Administered 2023-11-23 – 2023-11-24 (×2): 55 mg via SUBCUTANEOUS
  Filled 2023-11-22 (×2): qty 0.6

## 2023-11-22 MED ORDER — MORPHINE SULFATE (PF) 0.5 MG/ML IJ SOLN
INTRAMUSCULAR | Status: AC
  Filled 2023-11-22: qty 10

## 2023-11-22 MED ORDER — ACETAMINOPHEN 500 MG PO TABS
1000.0000 mg | ORAL_TABLET | Freq: Four times a day (QID) | ORAL | Status: DC
  Administered 2023-11-22 – 2023-11-24 (×9): 1000 mg via ORAL
  Filled 2023-11-22 (×9): qty 2

## 2023-11-22 MED ORDER — DEXAMETHASONE SODIUM PHOSPHATE 10 MG/ML IJ SOLN
INTRAMUSCULAR | Status: AC
  Filled 2023-11-22: qty 1

## 2023-11-22 MED ORDER — SODIUM CHLORIDE 0.9 % IV SOLN: INTRAVENOUS | Status: DC | PRN

## 2023-11-22 MED ORDER — LACTATED RINGERS IV SOLN: INTRAVENOUS | Status: DC | PRN

## 2023-11-22 MED ORDER — ACETAMINOPHEN 10 MG/ML IV SOLN
INTRAVENOUS | Status: DC | PRN
  Administered 2023-11-22: 1000 mg via INTRAVENOUS

## 2023-11-22 MED ORDER — CEFAZOLIN SODIUM-DEXTROSE 2-4 GM/100ML-% IV SOLN
2.0000 g | Freq: Three times a day (TID) | INTRAVENOUS | Status: AC
  Administered 2023-11-22 – 2023-11-23 (×3): 2 g via INTRAVENOUS
  Filled 2023-11-22 (×3): qty 100

## 2023-11-22 MED ORDER — WITCH HAZEL-GLYCERIN EX PADS: 1.0000 | MEDICATED_PAD | CUTANEOUS | Status: DC | PRN

## 2023-11-22 MED ORDER — DIBUCAINE (PERIANAL) 1 % EX OINT: 1.0000 | TOPICAL_OINTMENT | CUTANEOUS | Status: DC | PRN

## 2023-11-22 MED ORDER — BUPIVACAINE IN DEXTROSE 0.75-8.25 % IT SOLN
INTRATHECAL | Status: DC | PRN
  Administered 2023-11-22: 1.55 mL via INTRATHECAL

## 2023-11-22 MED ORDER — CEFAZOLIN SODIUM-DEXTROSE 2-3 GM-%(50ML) IV SOLR
INTRAVENOUS | Status: DC | PRN
  Administered 2023-11-22: 2 g via INTRAVENOUS

## 2023-11-22 MED ORDER — CEFAZOLIN SODIUM-DEXTROSE 2-4 GM/100ML-% IV SOLN
INTRAVENOUS | Status: AC
Start: 2023-11-22 — End: ?
  Filled 2023-11-22: qty 100

## 2023-11-22 MED ORDER — ZOLPIDEM TARTRATE 5 MG PO TABS: 5.0000 mg | ORAL_TABLET | Freq: Every evening | ORAL | Status: DC | PRN

## 2023-11-22 MED ORDER — SENNOSIDES-DOCUSATE SODIUM 8.6-50 MG PO TABS
2.0000 | ORAL_TABLET | ORAL | Status: DC
  Administered 2023-11-22: 2 via ORAL
  Filled 2023-11-22 (×3): qty 2

## 2023-11-22 SURGICAL SUPPLY — 32 items
BENZOIN TINCTURE PRP APPL 2/3 (GAUZE/BANDAGES/DRESSINGS) IMPLANT
CHLORAPREP W/TINT 26 (MISCELLANEOUS) ×2 IMPLANT
CLAMP UMBILICAL CORD (MISCELLANEOUS) ×1 IMPLANT
CLOTH BEACON ORANGE TIMEOUT ST (SAFETY) ×1 IMPLANT
DRSG OPSITE POSTOP 4X10 (GAUZE/BANDAGES/DRESSINGS) ×1 IMPLANT
ELECT REM PT RETURN 9FT ADLT (ELECTROSURGICAL) ×1
ELECTRODE REM PT RTRN 9FT ADLT (ELECTROSURGICAL) ×1 IMPLANT
EXTRACTOR VACUUM KIWI (MISCELLANEOUS) IMPLANT
EXTRACTOR VACUUM M CUP 4 TUBE (SUCTIONS) IMPLANT
GLOVE BIOGEL PI IND STRL 7.0 (GLOVE) ×1 IMPLANT
GLOVE ORTHO TXT STRL SZ7.5 (GLOVE) ×1 IMPLANT
GOWN STRL REUS W/TWL LRG LVL3 (GOWN DISPOSABLE) ×2 IMPLANT
KIT ABG SYR 3ML LUER SLIP (SYRINGE) IMPLANT
MAT PREVALON FULL STRYKER (MISCELLANEOUS) IMPLANT
NDL HYPO 25X5/8 SAFETYGLIDE (NEEDLE) ×1 IMPLANT
NEEDLE HYPO 25X5/8 SAFETYGLIDE (NEEDLE) ×1 IMPLANT
NS IRRIG 1000ML POUR BTL (IV SOLUTION) ×1 IMPLANT
PACK C SECTION WH (CUSTOM PROCEDURE TRAY) ×1 IMPLANT
PAD OB MATERNITY 4.3X12.25 (PERSONAL CARE ITEMS) ×1 IMPLANT
RETRACTOR TRAXI PANNICULUS (MISCELLANEOUS) IMPLANT
RTRCTR C-SECT PINK 25CM LRG (MISCELLANEOUS) ×1 IMPLANT
STRIP CLOSURE SKIN 1/2X4 (GAUZE/BANDAGES/DRESSINGS) IMPLANT
SUT CHROMIC 1 CTX 36 (SUTURE) ×2 IMPLANT
SUT PLAIN 0 NONE (SUTURE) IMPLANT
SUT PLAIN 2 0 XLH (SUTURE) IMPLANT
SUT PLAIN ABS 2-0 CT1 27XMFL (SUTURE) IMPLANT
SUT VIC AB 0 CT1 27XBRD ANBCTR (SUTURE) ×2 IMPLANT
SUT VIC AB 2-0 CT1 (SUTURE) ×1 IMPLANT
SUT VIC AB 4-0 KS 27 (SUTURE) IMPLANT
TOWEL OR 17X24 6PK STRL BLUE (TOWEL DISPOSABLE) ×1 IMPLANT
TRAY FOLEY W/BAG SLVR 14FR LF (SET/KITS/TRAYS/PACK) ×1 IMPLANT
WATER STERILE IRR 1000ML POUR (IV SOLUTION) ×1 IMPLANT

## 2023-11-22 NOTE — Anesthesia Preprocedure Evaluation (Signed)
Anesthesia Evaluation  Patient identified by MRN, date of birth, ID band Patient awake    Reviewed: Allergy & Precautions, NPO status , Patient's Chart, lab work & pertinent test results  Airway Mallampati: III  TM Distance: >3 FB Neck ROM: Full    Dental  (+) Chipped,    Pulmonary sleep apnea , pneumonia   Pulmonary exam normal breath sounds clear to auscultation       Cardiovascular negative cardio ROS Normal cardiovascular exam Rhythm:Regular Rate:Normal     Neuro/Psych  Headaches  Anxiety        GI/Hepatic negative GI ROS, Neg liver ROS,GERD  ,,  Endo/Other    Class 3 obesity  Renal/GU negative Renal ROS     Musculoskeletal negative musculoskeletal ROS (+)    Abdominal  (+) + obese  Peds  Hematology negative hematology ROS (+) Blood dyscrasia, anemia   Anesthesia Other Findings   Reproductive/Obstetrics                             Anesthesia Physical Anesthesia Plan  ASA: 3 and emergent  Anesthesia Plan: Spinal   Post-op Pain Management: Minimal or no pain anticipated   Induction: Intravenous  PONV Risk Score and Plan: 4 or greater and Ondansetron, Dexamethasone and Propofol infusion  Airway Management Planned:   Additional Equipment:   Intra-op Plan:   Post-operative Plan: Extubation in OR  Informed Consent: I have reviewed the patients History and Physical, chart, labs and discussed the procedure including the risks, benefits and alternatives for the proposed anesthesia with the patient or authorized representative who has indicated his/her understanding and acceptance.     Dental advisory given  Plan Discussed with: CRNA and Anesthesiologist  Anesthesia Plan Comments: ( )        Anesthesia Quick Evaluation

## 2023-11-22 NOTE — Lactation Note (Signed)
This note was copied from a baby's chart. Lactation Consultation Note  Patient Name: Anna Morrison [id number] Date: 11/22/2023 Age:35 hours Reason for consult: Initial assessment;Primapara;1st time breastfeeding;Early term 37-38.6wks;Breastfeeding assistance Baby awake and rooting when LC entered the room.  LC offered to assist and mom receptive.  LC reviewed basics of latching and assisted to latch on the right breast. Baby opened wide and latched easily with depth, swallows noted and increased with warm moist compress above the baby on the breast. Per mom comfortable. Baby fed 16 mins and fell asleep.  Baby STS with mom .  LC reviewed 24 hour BF goals - feed with feeding cues and if by 3 hours not awake, check the diaper, change if needed and work on latching the baby.  MBU nurse caring for the baby  informed the LC the Serum blood sugar - 36. LC informed the nurse the baby had fed well with swallows.   Maternal Data Has patient been taught Hand Expression?: Yes Does the patient have breastfeeding experience prior to this delivery?: No  Feeding Mother's Current Feeding Choice: Breast Milk  LATCH Score Latch: Grasps breast easily, tongue down, lips flanged, rhythmical sucking.  Audible Swallowing: Spontaneous and intermittent  Type of Nipple: Everted at rest and after stimulation  Comfort (Breast/Nipple): Soft / non-tender  Hold (Positioning): Assistance needed to correctly position infant at breast and maintain latch.  LATCH Score: 9   Lactation Tools Discussed/Used Pump Education: Milk Storage  Interventions Interventions: Breast feeding basics reviewed;Assisted with latch;Skin to skin;Breast massage;Hand express;Reverse pressure;Breast compression;Adjust position;Support pillows;Position options;Education;LC Services brochure;CDC Guidelines for Breast Pump Cleaning  Discharge Pump: DEBP;Personal (per mom DEBP Lansinoh)  Consult Status Consult Status:  Follow-up Date: 11/23/23 Follow-up type: In-patient    [name] 11/22/2023, 8:49 AM

## 2023-11-22 NOTE — Anesthesia Procedure Notes (Signed)
Spinal  Patient location during procedure: OR Start time: 11/22/2023 4:36 AM End time: 11/22/2023 4:40 AM Reason for block: surgical anesthesia Staffing Performed: other anesthesia staff  Anesthesiologist: Bethena Midget, MD Performed by: Bethena Midget, MD Authorized by: Bethena Midget, MD   Preanesthetic Checklist Completed: patient identified, IV checked, site marked, risks and benefits discussed, surgical consent, monitors and equipment checked, pre-op evaluation and timeout performed Spinal Block Patient position: sitting Prep: DuraPrep Patient monitoring: heart rate, cardiac monitor, continuous pulse ox and blood pressure Approach: midline Location: L3-4 Injection technique: single-shot Needle Needle type: Sprotte  Needle gauge: 24 G Needle length: 9 cm Assessment Sensory level: T4 Events: CSF return

## 2023-11-22 NOTE — Progress Notes (Signed)
Called Dr Jackelyn Knife to clarify orders. Patient can have toradol IV but not oral ibuprofen. Continue ancef postpartum as ordered.

## 2023-11-22 NOTE — Progress Notes (Signed)
Feeling some ctx Afeb, VSS FHT-140-150, min variability, repetitive late and variable decels, irreg ctx, Cat 2 VE no change  She did not receive any more cytotec due to ctx, eventually started on pitocin up to 4 mu/min.  Has had decels off and on, but persistent now even with pitocin off.  Discussed with patient, she is agreeable to c-section now.  Questions answered.  OR and anesthesia notified.

## 2023-11-22 NOTE — Anesthesia Postprocedure Evaluation (Signed)
Anesthesia Post Note  Patient: Anna Morrison  Procedure(s) Performed: CESAREAN SECTION     Patient location during evaluation: PACU Anesthesia Type: Spinal Level of consciousness: oriented and awake and alert Pain management: pain level controlled Vital Signs Assessment: post-procedure vital signs reviewed and stable Respiratory status: spontaneous breathing, respiratory function stable and patient connected to nasal cannula oxygen Cardiovascular status: blood pressure returned to baseline and stable Postop Assessment: no headache, no backache and no apparent nausea or vomiting Anesthetic complications: no   No notable events documented.  Last Vitals:  Vitals:   11/22/23 0850 11/22/23 0955  BP: 127/79 128/74  Pulse: 72 64  Resp: 18 20  Temp: 36.7 C 36.5 C  SpO2: 98% 94%    Last Pain:  Vitals:   11/22/23 0955  TempSrc: Oral  PainSc:                  Jaquavian Firkus

## 2023-11-22 NOTE — Progress Notes (Signed)
Subjective: Postpartum Day 0: Cesarean Delivery Patient reports tolerating PO.  She reports that her pain is minimal and asks if she needs to continue taking pain medication. I discussed that she is likely to get more sore tomorrow. She has been out of bed, but has not yet voided, as her catheter is still in place. She hopes to get it out soon.  Objective: Vital signs in last 24 hours: Temp:  [97.3 F (36.3 C)-98.2 F (36.8 C)] 97.7 F (36.5 C) (12/03 1443) Pulse Rate:  [59-99] 64 (12/03 1443) Resp:  [14-22] 16 (12/03 1443) BP: (102-135)/(53-91) 122/74 (12/03 1443) SpO2:  [94 %-100 %] 96 % (12/03 1443)  Physical Exam:  General: alert, cooperative, and no distress Lochia: appropriate  Recent Labs    11/21/23 1304  HGB 9.5*  HCT 32.0*    Assessment/Plan: Anna Morrison is a 37 y.o.G1 now P69 female s/p pLTCS at [redacted]w[redacted]d for fetal heart rate decelerations in the setting of IOL for ICP.  -POD0: Doing well postoperatively. Continue current care. 24 hours Ancef due to suboptimal sterility in C-section and VP shunt in abdomen. -obesity: plan ppx Lovenox on POD1 -anemia: plan home PO iron -h/o bariatric surgery: avoid PO NSAIDs Willa Frater, MD 11/22/2023, 6:10 PM

## 2023-11-22 NOTE — Transfer of Care (Signed)
Immediate Anesthesia Transfer of Care Note  Patient: Anna Morrison  Procedure(s) Performed: CESAREAN SECTION  Patient Location: PACU  Anesthesia Type:Spinal  Level of Consciousness: awake  Airway & Oxygen Therapy: Patient Spontanous Breathing  Post-op Assessment: Report given to RN  Post vital signs: stable  Last Vitals:  Vitals Value Taken Time  BP 115/85 11/22/23 0602  Temp    Pulse 77 11/22/23 0607  Resp 19 11/22/23 0607  SpO2 97 % 11/22/23 0607  Vitals shown include unfiled device data.  Last Pain:  Vitals:   11/22/23 0420  TempSrc: Oral  PainSc:          Complications: No notable events documented.

## 2023-11-22 NOTE — Op Note (Addendum)
Preoperative diagnosis: Intrauterine pregnancy at 37 weeks, cholestasis of pregnancy, fetal heart rate decelerations Postoperative diagnosis: Same Procedure: Urgent primary low transverse cesarean section without extensions Surgeon: Lavina Hamman M.D. Anesthesia: Spinal  Findings: Patient had normal gravid anatomy and delivered a viable female infant with Apgars of 2 and 8, weight 7 lbs 3 oz, arterial cord pH 6.91, VP shunt also encountered Estimated blood loss: 400 cc Specimens: Placenta sent for routine pathology Complications: None  Procedure in detail: The patient was taken to the operating room and placed in the sitting position. Dr. Miguel Rota instilled spinal anesthesia.  She was then placed in the dorsosupine position with left tilt. Fetal heart rate found to be in 60s so abdomen was quickly prepped with betadine after a foley catheter was inserted. The level of her anesthesia was found to be adequate. Abdomen was entered via a standard Pfannenstiel incision. Once the peritoneal cavity was entered, encountering her VP shunt tubing, the Alexis disposable self-retaining retractor was placed and good visualization was achieved. A 4 cm transverse incision was then made in the lower uterine segment pushing the bladder inferior. Once the uterine cavity was entered the incision was extended digitally, clear amniotic fluid. The fetal vertex was grasped and delivered through the incision atraumatically. Mouth and nares were suctioned. The remainder of the infant then delivered atraumatically. Cord was doubly clamped and cut after 30 seconds and the infant handed to the awaiting pediatric team. Cord blood and arterial cord pH were obtained. The placenta delivered spontaneously. Uterus was wiped dry with clean lap pad and all clots and debris were removed. Uterine incision was inspected and found to be free of extensions. Uterine incision was closed in 2 layers with running locking #1 Chromic for the first  layer, followed by a second, imbricating layer of #1 chromic due to bleeding. Tubes and ovaries were inspected and found to be normal. Uterine incision was inspected and found to be hemostatic. Bleeding from serosal edges was controlled with electrocautery. The Alexis retractor was removed and her shunt tubing was tucked behind the uterus. Subfascial space was irrigated and made hemostatic with electrocautery. Peritoneum was closed with 2-0 Vicryl and irrigation was carried out.  Fascia was closed in running fashion starting at both ends and meeting in the middle with 0 Vicryl. Subcutaneous tissue was then irrigated and made hemostatic with electrocautery, then closed with running 2-0 plain gut. Skin was closed with running 4-0 Vicryl subcuticular suture followed by steri-strips and a sterile dressing. Patient tolerated the procedure well and was taken to the recovery in stable condition. Counts were correct x2, she received Ancef 2 g IV and zithromax 500 mg IV at the beginning of the procedure and she had PAS hose on throughout the procedure.  Will give 3 more doses of Ancef due to quick betadine prep and VP shunt.

## 2023-11-23 LAB — SURGICAL PATHOLOGY

## 2023-11-23 LAB — CBC
HCT: 26.1 % — ABNORMAL LOW (ref 36.0–46.0)
Hemoglobin: 8 g/dL — ABNORMAL LOW (ref 12.0–15.0)
MCH: 24.5 pg — ABNORMAL LOW (ref 26.0–34.0)
MCHC: 30.7 g/dL (ref 30.0–36.0)
MCV: 80.1 fL (ref 80.0–100.0)
Platelets: 279 10*3/uL (ref 150–400)
RBC: 3.26 MIL/uL — ABNORMAL LOW (ref 3.87–5.11)
RDW: 13.5 % (ref 11.5–15.5)
WBC: 16.4 10*3/uL — ABNORMAL HIGH (ref 4.0–10.5)
nRBC: 0 % (ref 0.0–0.2)

## 2023-11-23 NOTE — Progress Notes (Signed)
Subjective: Postpartum Day 1: Cesarean Delivery Patient reports tolerating PO, + flatus, and no problems voiding. Moderate pain today, improved with analgesic regimen. Ambulating.  Objective: Vital signs in last 24 hours: Temp:  [97.7 F (36.5 C)-98.7 F (37.1 C)] 98.2 F (36.8 C) (12/04 0524) Pulse Rate:  [64-91] 91 (12/04 0524) Resp:  [16-18] 18 (12/04 0524) BP: (114-132)/(64-87) 114/64 (12/04 0524) SpO2:  [94 %-96 %] 95 % (12/03 2300)  Physical Exam:  General: alert and morbidly obese Lochia: appropriate Uterine Fundus: firm Incision: 2x2 cm area of blood-tinged drainage on right lower edge of Honeycomb dressing DVT Evaluation: No evidence of DVT seen on physical exam. No significant calf/ankle edema.  Recent Labs    11/21/23 1304 11/23/23 0553  HGB 9.5* 8.0*  HCT 32.0* 26.1*    Assessment/Plan: 37 yo G1P1 POD#1 s/p PLTCS  - PP: Doing well postoperatively, continue routine care. Encouraged ambulation. Multimodal pain regimen - Obesity: Lovenox for DVT ppx - Hx VP shunt: Ancef x 24 hours - Asymptomatic from acute on chronic blood loss anemia  Junius Creamer, DO 11/23/2023, 12:11 PM

## 2023-11-23 NOTE — Lactation Note (Signed)
This note was copied from a baby's chart. Lactation Consultation Note  Patient Name: Anna Morrison [id number] Date: 11/23/2023 Age:35 hours Reason for consult: Follow-up assessment;Primapara;Early term 92-38.6wks Baby was on the Breast when LC came into rm. Mom stated BF going well but she isn't sure if she is getting anything so she is giving formula after BF. Mom had baby in football hold and baby was BF well. Mom denies painful latch.  Asked mom if she is pumping, mom stated pumping some but not getting anything. Explained normal for now. Asked mom if she has any questions or concerns or needs any help. Mom stated not right now, it is going well right now.  Encouraged mom to call for assistance as needed.   Maternal Data    Feeding Mother's Current Feeding Choice: Breast Milk and Formula  LATCH Score Latch: Grasps breast easily, tongue down, lips flanged, rhythmical sucking.  Audible Swallowing: A few with stimulation  Type of Nipple: Everted at rest and after stimulation  Comfort (Breast/Nipple): Soft / non-tender  Hold (Positioning): No assistance needed to correctly position infant at breast.  LATCH Score: 9   Lactation Tools Discussed/Used    Interventions Interventions: Breast feeding basics reviewed  Discharge    Consult Status Consult Status: Follow-up Date: 11/24/23 Follow-up type: In-patient    Charyl Dancer 11/23/2023, 9:26 PM

## 2023-11-23 NOTE — Social Work (Signed)
MOB was referred for history of depression/anxiety.  * Referral screened out by Clinical Social Worker because none of the following criteria appear to apply:  ~ History of anxiety/depression during this pregnancy, or of post-partum depression following prior delivery.  ~ Diagnosis of anxiety and/or depression within last 3 years OR * MOB's symptoms currently being treated with medication and/or therapy.  Per chart review MOB's diagnosis was prior to December 2021, per [facility] records no MH concerns noted during this pregnancy.  Please contact the Clinical Social Worker if needs arise, by [facility] request, or if MOB scores greater than 9/yes to question 10 on [place] Postpartum Depression Screen.  Wende Neighbors, LCSWA Clinical Social Worker [phone]

## 2023-11-23 NOTE — Lactation Note (Signed)
This note was copied from a baby's chart. Lactation Consultation Note  Patient Name: Anna Morrison RUEAV'W Date: 11/23/2023 Age:35 hours Reason for consult: Follow-up assessment;Early term 37-38.6wks, See MOB: MR- C/S delivery, gastric sleeve 2023.  MOB latched infant on her right breast using the football hold position with pillow support, infant sustained latch, BF for 10 minutes then became tired, afterwards infant was supplemented with 13 mls of 20 kcal formula using a slow flow bottle nipple. MOB requested to use DEBP, LC fitted MOB with 21 mm breast flange and MOB was pumping as LC left the room. MOB understands that her EBM is safe for 4 hours whereas formula once open must be used within 1 hour. See handouts given below.  Current feeding plan: 1- MOB will continue to BF infant by cues, on demand, every 2-3 hours, skin to skin. 2- MOB will continue to supplement infant with any of her EBM 1st and then formula per feeding. 3- MOB will continue to use DEBP every 3 hours for 15 minutes on initial setting. 4- MOB will call for latch assistance if needed.   Maternal Data Has patient been taught Hand Expression?: Yes Does the patient have breastfeeding experience prior to this delivery?: No  Feeding Mother's Current Feeding Choice: Breast Milk and Formula  LATCH Score Latch: Grasps breast easily, tongue down, lips flanged, rhythmical sucking.  Audible Swallowing: A few with stimulation  Type of Nipple: Everted at rest and after stimulation  Comfort (Breast/Nipple): Soft / non-tender  Hold (Positioning): Assistance needed to correctly position infant at breast and maintain latch.  LATCH Score: 8   Lactation Tools Discussed/Used Tools: Pump;Flanges Flange Size: 21 Breast pump type: Double-Electric Breast Pump Pump Education: Setup, frequency, and cleaning;Milk Storage Reason for Pumping: MOB request, infant is ETI infant. Pumping frequency: MOB will continue to use DEBP  every 3 hours for 15 minutes on inital setting.  Interventions Interventions: Skin to skin;Assisted with latch;Breast compression;Adjust position;Support pillows;Position options;DEBP;Education;Pace feeding;Guidelines for Milk Supply and Pumping Schedule Handout;CDC Guidelines for Breast Pump Cleaning  Discharge    Consult Status Consult Status: Follow-up Date: 11/23/23 Follow-up type: In-patient    Frederico Hamman 11/23/2023, 2:40 AM

## 2023-11-24 MED ORDER — IBUPROFEN 600 MG PO TABS: 600.0000 mg | ORAL_TABLET | Freq: Four times a day (QID) | ORAL | 0 refills | Status: AC | PRN

## 2023-11-24 MED ORDER — OXYCODONE HCL 5 MG PO TABS: 5.0000 mg | ORAL_TABLET | ORAL | 0 refills | Status: AC | PRN

## 2023-11-24 NOTE — Lactation Note (Signed)
This note was copied from a baby's chart. Lactation Consultation Note  Patient Name: Anna Morrison EXBMW'U Date: 11/24/2023 Age:35 hours Reason for consult: Follow-up assessment;Early term 37-38.6wks;Infant weight loss (7 % weight loss, 2nd LC visit today, Dyad is being D/C today.) LC reviewed BF D/C teaching and the [facility] resources.  Due to 7 % weight loss and the milk is not in LC recommended to continue post pumping until the milk comes in . LC recommended if the baby is wide away and rooting after the 1st offer the 2nd breast.  If the baby has a sluggish feeding and the weight loss is 7 % or greater, supplement after she feeds on the 1st breast with EBM or formula.  LC offered to request an LC O/P and mom receptive.   Maternal Data    Feeding Mother's Current Feeding Choice: Breast Milk Nipple Type: Nfant Standard Flow (white)    Lactation Tools Discussed/Used Tools: Pump;Flanges Flange Size: 21 Breast pump type: Double-Electric Breast Pump;Manual Pump Education: Setup, frequency, and cleaning;Milk Storage  Interventions Education    Discharge Discharge Education: Engorgement and breast care;Warning signs for feeding baby Pump: Personal;DEBP;Manual LC O/P appt requested.  Consult Status Consult Status: Complete Date: 11/24/23    Kathrin Greathouse 11/24/2023, 3:19 PM

## 2023-11-24 NOTE — Lactation Note (Signed)
This note was copied from a baby's chart. Lactation Consultation Note  Patient Name: Anna Morrison Date: 11/24/2023 Age:35 hours Reason for consult: Follow-up assessment;Infant weight loss;Early term 37-38.6wks (7 % weight loss,) Per MBU nurse Tamela Oddi, mom desires to go home early.  As LC entered the room, per mom BF 10 mins and supplemented 20 ml.  As of yet no results from pumping. LC reminded mom can be a slow process. It's the breast stimulation that's important.  LC reviewed and updated the doc flow sheets per mom.  The output correlates with the weight loss of 7 %. Elevated skin Bili.    Maternal Data    Feeding Mother's Current Feeding Choice: Breast Milk and Formula Nipple Type: Nfant Standard Flow (white)  LATCH Score ( Latch score by the nurse )  Latch: Grasps breast easily, tongue down, lips flanged, rhythmical sucking.  Audible Swallowing: A few with stimulation  Type of Nipple: Everted at rest and after stimulation  Comfort (Breast/Nipple): Soft / non-tender  Hold (Positioning): Assistance needed to correctly position infant at breast and maintain latch.  LATCH Score: 8   Lactation Tools Discussed/Used Tools: Pump;Flanges Flange Size: 21 Breast pump type: Double-Electric Breast Pump Pump Education: Milk Storage;Setup, frequency, and cleaning Pumped volume: 0 mL (per mom)  Interventions Interventions: Breast feeding basics reviewed;DEBP;Hand pump;Education;LC Services brochure;CDC Guidelines for Breast Pump Cleaning  Discharge Pump: Personal;DEBP;Manual  Consult Status Consult Status: Follow-up Date: 11/24/23 Follow-up type: In-patient    Anna Morrison 11/24/2023, 9:18 AM

## 2023-12-03 ENCOUNTER — Telehealth (HOSPITAL_COMMUNITY): Payer: Self-pay

## 2023-12-03 NOTE — Telephone Encounter (Signed)
12/03/2023 0930  Name: Anna Morrison MRN: [id number] DOB: [birth year]  Reason for Call:  Transition of Care Hospital Discharge Call  Contact Status: Patient Contact Status: Message  Language assistant needed:          Follow-Up Questions:    Inocente Salles Postnatal Depression Scale:  In the Past 7 Days:    PHQ2-9 Depression Scale:     Discharge Follow-up:    Post-discharge interventions: NA  Signature  Signe Colt

## 2023-12-06 NOTE — Discharge Summary (Signed)
Postpartum Discharge Summary       Patient Name: Anna Morrison DOB: [birth year] MRN: [id number]  Date of admission: 11/21/2023 Delivery date:11/22/2023 Delivering provider: Jackelyn Knife, TODD Date of discharge: 12/06/2023  Admitting diagnosis: Pregnant and not yet delivered in third trimester [Z34.93] Intrauterine pregnancy: [redacted]w[redacted]d     Secondary diagnosis:  Principal Problem:   Pregnant and not yet delivered in third trimester  Additional problems: Cholestasis of pregnancy, maternal obesity, anemia of pregnancy, history of bariatric surgery, history of basal encephalocele with VP shunt    Discharge diagnosis: Cholestasis of pregnancy, maternal obesity, anemia of pregnancy, history of bariatric surgery, history of basal encephalocele with VP shunt.  Reassuring fetal heart rate                                              Post partum procedures: N/A Augmentation: AROM, Pitocin, and Cytotec Complications: None  Hospital course: Induction of Labor With Cesarean Section   37 y.o. yo G1P1001 at [redacted]w[redacted]d was admitted to the hospital 11/21/2023 for induction of labor. Patient had a labor course significant for nonreassuring fetal heart rate. The patient went for cesarean section due to Non-Reassuring FHR. Delivery details are as follows: Membrane Rupture Time/Date: 4:51 AM,11/22/2023  Delivery Method:C-Section, Low Transverse Operative Delivery:N/A Details of operation can be found in separate operative Note.  Patient had a postpartum course complicated by nothing. She is ambulating, tolerating a regular diet, passing flatus, and urinating well.  Patient is discharged home in stable condition on 12/06/23.      Newborn Data: Birth date:11/22/2023 Birth time:4:51 AM Gender:Female Living status:Living Apgars:2 ,8  Weight:3.28 kg                               Magnesium Sulfate received: No BMZ received: No Rhophylac:N/A Immunizations administered: There is no immunization history for the  selected administration types on file for this patient.  Physical exam  Vitals:   11/23/23 2100 11/24/23 0548 11/24/23 0956 11/24/23 1455  BP: 124/72 133/86 132/79 123/79  Pulse: 100 (!) 106 93 (!) 104  Resp: 18 18 18    Temp: 98.2 F (36.8 C) 99.5 F (37.5 C) 97.7 F (36.5 C)   TempSrc: Oral Oral Oral   SpO2: 100% 97%    Weight:      Height:       General: alert, cooperative, and no distress Lochia: appropriate Uterine Fundus: firm Incision: Healing well with no significant drainage DVT Evaluation: No evidence of DVT seen on physical exam. Labs: Lab Results  Component Value Date   WBC 16.4 (H) 11/23/2023   HGB 8.0 (L) 11/23/2023   HCT 26.1 (L) 11/23/2023   MCV 80.1 11/23/2023   PLT 279 11/23/2023      Latest Ref Rng & Units 09/20/2023    5:51 AM  CMP  Glucose mg/dL 80    Edinburgh Score:    11/23/2023    8:53 PM  Edinburgh Postnatal Depression Scale Screening Tool  I have been able to laugh and see the funny side of things. 0  I have looked forward with enjoyment to things. 0  I have blamed myself unnecessarily when things went wrong. 0  I have been anxious or worried for no good reason. 1  I have felt scared or panicky for no good reason.  0  Things have been getting on top of me. 0  I have been so unhappy that I have had difficulty sleeping. 0  I have felt sad or miserable. 0  I have been so unhappy that I have been crying. 0  The thought of harming myself has occurred to me. 0  Edinburgh Postnatal Depression Scale Total 1      After visit meds:  Allergies as of 11/24/2023   No Known Allergies      Medication List     STOP taking these medications    acetaminophen 500 MG tablet Commonly known as: TYLENOL   aspirin 81 MG chewable tablet   polyethylene glycol 17 g packet Commonly known as: MIRALAX / GLYCOLAX   ursodiol 300 MG capsule Commonly known as: ACTIGALL       TAKE these medications    ibuprofen 600 MG tablet Commonly known as:  ADVIL Take 1 tablet (600 mg total) by mouth every 6 (six) hours as needed.   MULTI-VITAMIN DAILY PO Take 1 tablet by mouth daily. Prenatal  folic acid +DHA   oxyCODONE 5 MG immediate release tablet Commonly known as: Roxicodone Take 1 tablet (5 mg total) by mouth every 4 (four) hours as needed.               Discharge Care Instructions  (From admission, onward)           Start     Ordered   11/24/23 0000  Discharge wound care:       Comments: For a cesarean delivery: You may wash incision with soap and water.  Do not soak or submerge the incision for 2 weeks. Keep incision dry. You may need to keep a sanitary pad or panty liner between the incision and your clothing for comfort and to keep the incision dry. If you note drainage, increased pain, or increased redness of the incision, then please notify your physician.   11/24/23 1127   11/24/23 0000  If the dressing is still on your incision site when you go home, remove it on the third day after your surgery date. Remove dressing if it begins to fall off, or if it is dirty or damaged before the third day.       Comments: For a cesarean delivery   11/24/23 1127             Discharge home in stable condition Infant Feeding: Breast Infant Disposition:home with mother Discharge instruction: per After Visit Summary and Postpartum booklet. Activity: Advance as tolerated. Pelvic rest for 6 weeks.  Diet: routine diet Anticipated Birth Control: Unsure Postpartum Appointment:2-3 days Additional Postpartum F/U:  Future Appointments:No future appointments. Follow up Visit:      12/06/2023 Waynard Reeds, MD
